# Patient Record
Sex: Female | Born: 1955 | Race: White | Hispanic: No | Smoking: Current every day smoker
Health system: Southern US, Community
[De-identification: ages and names within clinical notes are randomized; demographics above are authoritative.]

## PROBLEM LIST (undated history)

## (undated) DIAGNOSIS — K219 Gastro-esophageal reflux disease without esophagitis: Secondary | ICD-10-CM

## (undated) DIAGNOSIS — K5909 Other constipation: Secondary | ICD-10-CM

## (undated) DIAGNOSIS — I639 Cerebral infarction, unspecified: Secondary | ICD-10-CM

## (undated) DIAGNOSIS — F419 Anxiety disorder, unspecified: Secondary | ICD-10-CM

## (undated) DIAGNOSIS — E119 Type 2 diabetes mellitus without complications: Secondary | ICD-10-CM

## (undated) DIAGNOSIS — F32A Depression, unspecified: Secondary | ICD-10-CM

## (undated) DIAGNOSIS — I10 Essential (primary) hypertension: Secondary | ICD-10-CM

## (undated) DIAGNOSIS — J449 Chronic obstructive pulmonary disease, unspecified: Secondary | ICD-10-CM

## (undated) DIAGNOSIS — J45909 Unspecified asthma, uncomplicated: Secondary | ICD-10-CM

## (undated) DIAGNOSIS — F329 Major depressive disorder, single episode, unspecified: Secondary | ICD-10-CM

## (undated) HISTORY — PX: ABDOMINAL HYSTERECTOMY: SHX81

## (undated) HISTORY — PX: CHOLECYSTECTOMY: SHX55

## (undated) HISTORY — PX: APPENDECTOMY: SHX54

## (undated) HISTORY — PX: OTHER SURGICAL HISTORY: SHX169

---

## 2006-03-29 ENCOUNTER — Ambulatory Visit: Payer: Self-pay | Admitting: Internal Medicine

## 2006-09-26 ENCOUNTER — Other Ambulatory Visit: Payer: Self-pay

## 2006-09-26 ENCOUNTER — Emergency Department: Payer: Self-pay | Admitting: Internal Medicine

## 2006-09-27 ENCOUNTER — Ambulatory Visit: Payer: Self-pay | Admitting: Internal Medicine

## 2013-09-04 ENCOUNTER — Inpatient Hospital Stay: Payer: Self-pay | Admitting: Internal Medicine

## 2013-09-04 LAB — COMPREHENSIVE METABOLIC PANEL
Albumin: 3.8 g/dL (ref 3.4–5.0)
Alkaline Phosphatase: 143 U/L — ABNORMAL HIGH
Anion Gap: 10 (ref 7–16)
BUN: 8 mg/dL (ref 7–18)
Bilirubin,Total: 0.3 mg/dL (ref 0.2–1.0)
CREATININE: 0.9 mg/dL (ref 0.60–1.30)
Calcium, Total: 9 mg/dL (ref 8.5–10.1)
Chloride: 99 mmol/L (ref 98–107)
Co2: 30 mmol/L (ref 21–32)
EGFR (Non-African Amer.): >60
GLUCOSE: 131 mg/dL — AB (ref 65–99)
OSMOLALITY: 278 (ref 275–301)
POTASSIUM: 3.4 mmol/L — AB (ref 3.5–5.1)
SGOT(AST): 28 U/L (ref 15–37)
SGPT (ALT): 25 U/L
Sodium: 139 mmol/L (ref 136–145)
Total Protein: 7.9 g/dL (ref 6.4–8.2)

## 2013-09-04 LAB — TROPONIN I

## 2013-09-04 LAB — CBC
HCT: 44.5 % (ref 35.0–47.0)
HGB: 14.6 g/dL (ref 12.0–16.0)
MCH: 28.1 pg (ref 26.0–34.0)
MCHC: 32.8 g/dL (ref 32.0–36.0)
MCV: 86 fL (ref 80–100)
Platelet: 240 10*3/uL (ref 150–440)
RBC: 5.21 10*6/uL — AB (ref 3.80–5.20)
RDW: 14.6 % — ABNORMAL HIGH (ref 11.5–14.5)
WBC: 7.1 10*3/uL (ref 3.6–11.0)

## 2013-09-04 LAB — CK TOTAL AND CKMB (NOT AT ARMC)
CK, TOTAL: 158 U/L
CK-MB: 2.1 ng/mL (ref 0.5–3.6)

## 2013-09-04 LAB — MAGNESIUM: Magnesium: 1.6 mg/dL — ABNORMAL LOW

## 2013-09-05 LAB — MAGNESIUM: Magnesium: 1.9 mg/dL

## 2013-10-18 LAB — COMPREHENSIVE METABOLIC PANEL
ALBUMIN: 3.4 g/dL (ref 3.4–5.0)
ALK PHOS: 127 U/L — AB
ANION GAP: 3 — AB (ref 7–16)
AST: 14 U/L — AB (ref 15–37)
BUN: 8 mg/dL (ref 7–18)
Bilirubin,Total: 0.2 mg/dL (ref 0.2–1.0)
CALCIUM: 8.3 mg/dL — AB (ref 8.5–10.1)
Chloride: 102 mmol/L (ref 98–107)
Co2: 36 mmol/L — ABNORMAL HIGH (ref 21–32)
Creatinine: 1.06 mg/dL (ref 0.60–1.30)
EGFR (African American): >60
EGFR (Non-African Amer.): 57 — ABNORMAL LOW
Glucose: 112 mg/dL — ABNORMAL HIGH (ref 65–99)
Osmolality: 280 (ref 275–301)
POTASSIUM: 3.6 mmol/L (ref 3.5–5.1)
SGPT (ALT): 19 U/L
Sodium: 141 mmol/L (ref 136–145)
Total Protein: 6.6 g/dL (ref 6.4–8.2)

## 2013-10-18 LAB — CBC
HCT: 39.2 % (ref 35.0–47.0)
HGB: 12.6 g/dL (ref 12.0–16.0)
MCH: 28 pg (ref 26.0–34.0)
MCHC: 32 g/dL (ref 32.0–36.0)
MCV: 88 fL (ref 80–100)
Platelet: 229 10*3/uL (ref 150–440)
RBC: 4.48 10*6/uL (ref 3.80–5.20)
RDW: 15 % — ABNORMAL HIGH (ref 11.5–14.5)
WBC: 13.5 10*3/uL — ABNORMAL HIGH (ref 3.6–11.0)

## 2013-10-18 LAB — TROPONIN I: Troponin-I: <0.02 ng/mL

## 2013-10-19 ENCOUNTER — Inpatient Hospital Stay: Payer: Self-pay | Admitting: Internal Medicine

## 2013-10-22 LAB — PLATELET COUNT: Platelet: 240 10*3/uL (ref 150–440)

## 2013-10-23 LAB — EXPECTORATED SPUTUM ASSESSMENT W GRAM STAIN, RFLX TO RESP C

## 2013-10-24 LAB — EXPECTORATED SPUTUM ASSESSMENT W GRAM STAIN, RFLX TO RESP C

## 2013-11-02 DIAGNOSIS — G250 Essential tremor: Secondary | ICD-10-CM | POA: Insufficient documentation

## 2014-05-04 NOTE — Discharge Summary (Signed)
PATIENT NAME:  Sheri Barron, Sheri Barron MR#:  [id number] DATE OF BIRTH:  Jun 23, 1955  DATE OF ADMISSION:  09/04/2013 DATE OF DISCHARGE:  09/07/2013  PRIMARY CARE PHYSICIAN: [facility]   FINAL DIAGNOSES: 1.  Chronic obstructive pulmonary disease exacerbation.  2.  Tobacco abuse.  3.  Hypokalemia.  4.  Depression.   MEDICATIONS ON DISCHARGE: Include gabapentin 100 mg 3 times a day, trazodone 100 mg 3 tablets at bedtime, venlafaxine extended release 150 mg oral capsule once a day, propranolol 10 mg 2 tablets twice a day, Spiriva 1 inhalation daily, omeprazole 40 mg twice a day, ProAir HFA 1 puff 4 times a day as needed for shortness of breath, prednisone taper 10 mg 4 tablets day one, 3 tablets day two, 2 tablets day three, 1 tablet day four, 1/2 tablet day five and six, aspirin 81 mg daily, Symbicort 2 puffs twice a day, azithromycin 250 mg 1 tablet twice a day for 2 days, nicotine patch at 14 mg per chest wall film 1 tablet daily.   DIET: Low sodium diet, regular consistency.   ACTIVITY: As tolerated.   DISCHARGE FOLLOWUP: Follow up in 1 to 2 weeks at the [facility].   REASON FOR ADMISSION: The patient was admitted September 04, 2013 and discharged September 07, 2013. Came in with shortness of breath, cough and wheezing for 3 days. Was admitted for COPD exacerbation and started on IV Solu-Medrol.   DIAGNOSTIC DATA: During the hospital course included a magnesium of 1.6. Troponin 0.2. Glucose 131, BUN 8, creatinine 0.91, sodium 139, potassium 3.4, chloride 99, CO2 30, calcium 9. Liver function tests normal range. White blood cell count 7.1, H and H 14.6 and 44.5, platelet count 240,000.   EKG showed normal sinus rhythm, no acute ST-T wave changes.   Chest x-ray showed COPD, no acute cardiopulmonary disease.   Repeat magnesium 1.9.   HOSPITAL COURSE PER PROBLEM LIST:  1.  For the patient's COPD exacerbation, started on high-dose steroids. The patient at the time of discharge was moving better air  but still decreased breath sounds. Slight expiratory wheeze but felt much better than presentation. The patient was stable for discharge home on a prednisone taper and finish up the Zithromax. I did write her all her inhalers again. I actually wrote all of her scripts again.  2.  Tobacco abuse. Smoking cessation counseling done, 3 minutes by me. 3.  Hypokalemia and hypomagnesemia. Replaced during the hospital course.  4.  Depression. Continue psychiatric medications.  5.  History of stroke. I did start aspirin.  TIME SPENT ON DISCHARGE: 35 minutes.   ____________________________ Tana Conch. Leslye Peer, MD rjw:sb D: 09/07/2013 13:07:09 ET T: 09/07/2013 16:55:53 ET JOB#: [id number]  cc: Tana Conch. Leslye Peer, MD, <Dictator> [place] MD ELECTRONICALLY SIGNED 09/14/2013 15:08

## 2014-05-04 NOTE — H&P (Signed)
PATIENT NAME:  Sheri Barron, Sheri Barron MR#:  [id number] DATE OF BIRTH:  [birth year]  DATE OF ADMISSION:  10/18/2013  PRIMARY CARE PHYSICIAN:  [facility].   CHIEF COMPLAINT: Increasing shortness of breath and wheezing.   HISTORY OF PRESENT ILLNESS: Sheri Barron is a 59 year old Caucasian female with history of COPD and ongoing tobacco abuse along with history of depression. She comes to the Emergency Room with increasing shortness of breath and mild cough, wheezing. She denies any fever or chills. She had 2 rounds of antibiotics along with steroid taper recently in the past 1.5 months. Currently her oxygen saturations are anywhere from 89% to 93% on room air. She is being admitted for COPD exacerbation. Chest x-ray does not show any pneumonia and she is afebrile.   PAST MEDICAL HISTORY:  1.  COPD with ongoing tobacco abuse.  2.  History of CVA.  3.  Depression.   ALLERGIES: No known drug allergies.   MEDICATIONS:  1.  Aspirin 81 mg daily.  2.  Cyclobenzaprine 5 mg at bedtime.  3.  Fluticasone nasal spray 2 sprays nasally once a day.  4.  Gabapentin 100 mg 1 capsule 3 times a day.  5.  Omeprazole 40 mg 1 b.i.d.  6.  ProAir HFA 1 puff 4 times a day.  7.  Propranolol 10 mg 2 tablets b.i.d.  8.  Spiriva 18 mcg inhalation daily.  9.  Symbicort 160/4.5, 2 puffs b.i.d.  10. Trazodone 300 mg at bedtime.  11. Venlafaxine extended release 150 mg p.o. daily.   SOCIAL HISTORY: Smokes about 12 cigarettes a day. The patient was advised on smoking cessation. She reports she is working on it, she would prefer having a nicotine patch. Denies any alcohol or illicit drug use.   FAMILY HISTORY: Cancer in her family.   PAST SURGICAL HISTORY: Cholecystectomy, appendectomy and hysterectomy.   REVIEW OF SYSTEMS.  CONSTITUTIONAL: No fever, fatigue, weakness.  EYES: No blurred or double vision, glaucoma or cataracts.  ENT: No tinnitus, ear pain, hearing loss or postnasal drip.  RESPIRATORY: Positive for cough  shortness of breath and wheeze. Positive for COPD. CARDIOVASCULAR: No chest pain, edema, orthopnea, or dyspnea on exertion.  GASTROINTESTINAL: No nausea, vomiting, diarrhea, abdominal pain.  GENITOURINARY: No dysuria, hematuria, or frequency.  ENDOCRINE: No polyuria, nocturia or thyroid problems.  HEMATOLOGY: No anemia or easy bruising.  SKIN: No acne or rash.  MUSCULOSKELETAL: Positive for arthritis. No swelling or gout.  NEUROLOGIC: No CVA, TIA, dementia or dysarthria.  PSYCHIATRIC: No anxiety, insomnia, positive for history of depression. All other systems reviewed and negative.   PHYSICAL EXAMINATION:  GENERAL: The patient is awake, alert, oriented x3, not in acute distress.  VITAL SIGNS: Afebrile, pulse is 81, blood pressure is 89/63. Saturation is 93% on room air.  HEENT: Atraumatic, normocephalic. Pupils PERRLA. EOM intact. Oral mucosa is moist.  NECK: Supple. No JVD. No carotid bruit.  RESPIRATORY:  There are distant breath sounds, there is bilateral expiratory wheezing present. No respiratory distress or labored breathing.  CARDIOVASCULAR: Both heart sounds are normal. Rate, rhythm regular. PMI not lateralized. Chest is nontender.  EXTREMITIES: Good pedal pulses, good femoral pulses. No lower extremity edema.  ABDOMEN: Soft, benign, nontender. No organomegaly.  NEUROLOGIC: Grossly nonfocal, motor and sensory within normal limits. She follows commands.  PSYCHIATRIC: The patient is awake, alert, oriented x3.  SKIN: Warm and dry.   LABORATORY DATA: White count is 13.2, glucose is 112, CO2 is 36.   Chest x-ray consistent with COPD.  No pneumonia.   ASSESSMENT:  A 59 year old, Sheri Barron has a history of chronic obstructive pulmonary disease and ongoing tobacco abuse, comes in with:  1.  Acute chronic obstructive pulmonary disease exacerbation. The patient will be admitted on the medical floor. Regular diet. She is advised smoking cessation. We will give her IV Solu-Medrol around  the clock along with DuoNebs and her oral inhalers. At present, no indication for antibiotics. She recently finished 2 rounds of antibiotics. She does not have any fever and no evidence of pneumonia on the chest x-ray.  2.  Tobacco abuse. Nicotine patch has been ordered.  3.  Depression. Continue venlafaxine and trazodone.  4.  History of cerebrovascular accident, on aspirin.  5.  DVT prophylaxis. Subcutaneous heparin.    The above was discussed with the patient and patient's friend.   TIME SPENT: 50 minutes.    ____________________________ Sheri Rochester Posey Pronto, MD sap:lt D: 10/18/2013 13:35:59 ET T: 10/18/2013 14:12:20 ET JOB#: [id number]  cc: Sheri Barron A. Posey Pronto, MD, <Dictator> [facility] Ilda Basset MD ELECTRONICALLY SIGNED 10/29/2013 15:53

## 2014-05-04 NOTE — H&P (Signed)
PATIENT NAME:  Sheri Barron, Sheri Barron MR#:  [id number] DATE OF BIRTH:  [birth year]  DATE OF ADMISSION:  09/04/2013  PRIMARY CARE PHYSICIAN: Dr. Janalyn Rouse.    REFERRING PHYSICIAN: Dr. Thomasene Lot.   CHIEF COMPLAINT: Shortness of breath, cough, wheezing for the past 3 days.   HISTORY OF PRESENT ILLNESS: A 59 year old Caucasian female with a history of COPD, stroke depression presented to the ED with the above chief complaint. The patient is alert, awake, oriented, in no acute distress. The patient has history of COPD and got a cold 1 week ago and started to have shortness of breath, cough, wheezing, and sputum for the past 3 days. The patient had yellowish sputum, but denies any fever or chills. The patient had run out of nebulizer for several days, so she came to the ED for further treatment. The patient got 3 rounds of nebulizer without improvement. Dr. Thomasene Lot admitted for  COPD exacerbation.   The patient denies any chest pain, palpitation, orthopnea, nocturnal dyspnea. No leg edema. The patient denies any other symptoms.   PAST MEDICAL HISTORY: CVA, depression, COPD.   PAST SURGICAL HISTORY: Cholecystectomy, appendectomy, hysterectomy.   FAMILY HISTORY: Cancer in her family.   SOCIAL HISTORY: Smoked 2 packs a day several years ago, cut to 1 pack a day recently for 1 year. Denies any alcohol drinking or illicit drugs.   ALLERGIES: None.   HOME MEDICATIONS: Venlafaxine extended release 150 mg oral capsule 1 cap once a day, trazodone 100 mg 3 tablets at bedtime, Spiriva 18 mcg inhaled once a day, propranolol 10 mg p.o. 2 tablets b.i.d., ProAir HFA CFC free 90 mcg inhalation 2 puffs inhaled 4 times a day p.r.n., omeprazole 40 mg p.o. b.i.d., gabapentin 100 mg p.o. t.i.d., cyclobenzaprine 10 mg 0.5 tablets once a day.   REVIEW OF SYSTEMS: CONSTITUTIONAL: The patient denies any fever or chills. No headache or dizziness. No weakness.  EYES: No double vision or blurred vision.  ENT: No postnasal drip,  slurred speech, or dysphagia.  CARDIOVASCULAR: No chest pain, palpitation, orthopnea, or nocturnal dyspnea. No leg edema.  PULMONARY: Positive for cough, sputum, shortness of breath and wheezing. No hemoptysis.  GASTROINTESTINAL: No abdominal pain, nausea, vomiting, diarrhea. No melena or bloody stool.  GENITOURINARY: No dysuria, hematuria, or incontinence.  SKIN: No rash or jaundice.  NEUROLOGY: No syncope, loss of consciousness, or seizure.  ENDOCRINE: No polyuria, polydipsia, heat or cold intolerance.  HEMATOLOGIC: No easy bruising  or bleeding.   PHYSICAL EXAMINATION: VITAL SIGNS: Temperature 98.2, blood pressure 139/85, pulse 100 and just now it was 113, 02 saturation 93%.  GENERAL: The patient is alert, awake, oriented, in no acute distress.  HEENT: Pupils round, equal, and reactive to light and accommodation. Moist oral mucosa. Clear oropharynx.  NECK: Supple. No JVD or carotid bruits are noted. No lymphadenopathy. No thyromegaly.  CARDIOVASCULAR: S1, S2. Regular rate and rhythm. No murmurs or gallops.  PULMONARY: Bilateral air entry. Weak breath sounds, mild wheezing, mild use of accessory muscles to breathe.  ABDOMEN: Soft. No distention or tenderness. No organomegaly. Bowel sounds present.  EXTREMITIES: No edema, clubbing, or cyanosis. No calf tenderness. Bilateral pedal pulses present.  SKIN: No rash or jaundice.  NEUROLOGIC: A and O x 3. No focal deficit. Power 5/5. Sensation intact.   LABORATORY DATA: Chest x-ray shows COPD but no acute cardiopulmonary disease. CK 158, CK-MB 2.1. CBC in normal range. Glucose 131, BUN 8, creatinine 0.9, sodium 139, potassium 3.4, chloride 99, bicarbonate 30. Troponin less than 0.02. EKG  showed normal sinus rhythm at 92 BPM.   IMPRESSIONS: 1.  Chronic obstructive pulmonary exacerbation.  2.  Hypokalemia.   3.  Tobacco abuse.  4.  Depression.  5.  History of cerebrovascular accident.   PLAN OF TREATMENT: 1.  The patient will be admitted to  the medical floor. We will continue nebulizer treatment with DuoNeb around the clock. Continue oxygen by nasal cannula. We will start Solu-Medrol and continue Spiriva.  2.  Hypokalemia. We will give potassium supplement, check a magnesium level, follow up BMP.   3.  For tobacco abuse, smoking cessation was counseled for 4 minutes. We will give nicotine patch.  4.  Continue depression medications.   I discussed the patient's condition and plan of treatment with the patient and the patient's daughter.   CODE STATUS: The patient wants full code.   TIME SPENT: About 52 minutes.    ____________________________ Demetrios Loll, MD qc:at D: 09/04/2013 14:39:31 ET T: 09/04/2013 15:33:59 ET JOB#: [id number]  cc: Demetrios Loll, MD, <Dictator> Demetrios Loll MD ELECTRONICALLY SIGNED 09/05/2013 16:37

## 2014-05-04 NOTE — Discharge Summary (Signed)
PATIENT NAME:  Sheri Barron, Sheri Barron MR#:  [id number] DATE OF BIRTH:  09-Nov-1955  DATE OF ADMISSION:  10/18/2013 DATE OF DISCHARGE:  10/22/2013  ADMITTING PHYSICIAN: Fritzi Mandes, MD  DISCHARGING PHYSICIAN: Gladstone Lighter, MD  PRIMARY CARE PHYSICIAN: [facility]  DISCHARGE DIAGNOSES: 1.  Acute on chronic obstructive pulmonary disease exacerbation.  2.  Acute bronchitis.  3.  Acute sinusitis.  4.  Tobacco use disorder.  5.  Neuropathy.  6.  Depression.   DISCHARGE HOME MEDICATIONS:  1.  Pro Air inhaler 1 puff 4 times a day as needed for shortness of breath.  2.  Omeprazole 40 mg p.o. b.i.d.  3.  Aspirin 81 mg p.o. daily.  4.  Flexeril 5 mg p.o. at bedtime.  5.  Flonase nasal spray 2 sprays in each nostril once a day.  6.  Gabapentin 100 mg p.o. 3 times a day.  7.  Trazodone 300 mg p.o. at bedtime.  8.  Venlafaxine 150 mg p.o. daily.  9.  Propranolol 20 mg p.o. b.i.d.  10.  Symbicort 160/4.5 mcg 2 puffs b.i.d.  11.  Spiriva inhaler 1 inhalation daily.  12.  Prednisone taper over 6 days.  13.  Mucinex 600 mg p.o. b.i.d. for 5 more days.  14.  Levaquin 750 mg p.o. daily for 5 more days.  DISCHARGE HOME OXYGEN: 2 liters.   DISCHARGE DIET: Regular diet.   DISCHARGE ACTIVITY: As tolerated.  FOLLOWUP INSTRUCTIONS: PCP followup in 1 to 2 weeks and advised to stop smoking.   DIAGNOSTIC DATA: Prior to discharge: WBC 13.5, hemoglobin 12.6, hematocrit 39.2, platelet count 229,000.   Sodium 141, potassium 3.6, chloride 102, bicarb 36, BUN 8, creatinine 1.06, glucose 112, calcium 8.3.   ALT 19, AST 14, alk phos 127, total bilirubin 0.2, albumin 3.4. Troponins negative.   Chest x-ray on admission showing COPD changes. No acute abnormalities.   Sputum cultures are negative so far.   BRIEF HOSPITAL COURSE: [name] is a 59 year old female with past medical history significant for COPD, ongoing smoking, depression and neuropathy who presents to the hospital secondary to worsening  shortness of breath and wheezing and also productive cough. 1.  Acute on chronic obstructive pulmonary disease exacerbation with ongoing smoking. Started on IV steroids, nebulizers, and also her home inhalers were continued. She was hypoxic requiring 2 liters oxygen. Even with her COPD improvement, her oxygen requirements have not improved. Chest x-ray showed only COPD changes. Her sats dropped down to 84% on exertion, so she qualified for home oxygen and is being discharged on 2 liters home oxygen. She was strongly advised to quit smoking and the dangers of using oxygen and smoking at the same time have been explained to the patient. The patient acknowledged it and expressed interest to quit smoking at this point. Steroids are being changed over to oral steroids. She is on Levaquin because of her thick, greenish productive sputum. She probably has underlying bronchitis and also sinusitis clinically. She will be discharged on antibiotics as well. 2.  Depression. Her home medications were continued.  3.  Neuropathy. She is on gabapentin.  Her course has been otherwise uneventful in the hospital.   DISCHARGE CONDITION: Stable.   DISCHARGE DISPOSITION: Home.   TIME SPENT ON DISCHARGE: 40 minutes.  ____________________________ Gladstone Lighter, MD rk:sb D: 10/22/2013 10:43:03 ET T: 10/22/2013 11:38:30 ET JOB#: [id number]  cc: Gladstone Lighter, MD, <Dictator> [facility] Gladstone Lighter MD ELECTRONICALLY SIGNED 10/24/2013 18:06

## 2014-05-22 ENCOUNTER — Other Ambulatory Visit: Payer: Self-pay | Admitting: Otolaryngology

## 2014-05-22 DIAGNOSIS — R1310 Dysphagia, unspecified: Secondary | ICD-10-CM

## 2014-05-30 ENCOUNTER — Ambulatory Visit: Payer: Self-pay | Attending: Otolaryngology

## 2014-07-29 ENCOUNTER — Other Ambulatory Visit: Payer: Self-pay | Admitting: Nurse Practitioner

## 2014-07-29 DIAGNOSIS — K5900 Constipation, unspecified: Secondary | ICD-10-CM

## 2014-07-29 DIAGNOSIS — R1310 Dysphagia, unspecified: Secondary | ICD-10-CM

## 2014-07-29 DIAGNOSIS — R1032 Left lower quadrant pain: Secondary | ICD-10-CM

## 2014-08-02 ENCOUNTER — Ambulatory Visit
Admission: RE | Admit: 2014-08-02 | Discharge: 2014-08-02 | Disposition: A | Discharge status: Home / Self Care | Payer: Medicare Other | Source: Ambulatory Visit | Attending: Nurse Practitioner | Admitting: Nurse Practitioner

## 2014-08-02 DIAGNOSIS — K760 Fatty (change of) liver, not elsewhere classified: Secondary | ICD-10-CM | POA: Insufficient documentation

## 2014-08-02 DIAGNOSIS — K5900 Constipation, unspecified: Secondary | ICD-10-CM | POA: Diagnosis present

## 2014-08-02 DIAGNOSIS — R1032 Left lower quadrant pain: Secondary | ICD-10-CM | POA: Diagnosis present

## 2014-08-02 HISTORY — DX: Chronic obstructive pulmonary disease, unspecified: J44.9

## 2014-08-02 MED ORDER — IOHEXOL 300 MG/ML  SOLN
100.0000 mL | Freq: Once | INTRAMUSCULAR | Status: AC | PRN
  Administered 2014-08-02: 100 mL via INTRAVENOUS

## 2014-08-09 ENCOUNTER — Ambulatory Visit
Admission: RE | Admit: 2014-08-09 | Discharge: 2014-08-09 | Disposition: A | Discharge status: Home / Self Care | Payer: Medicare Other | Source: Ambulatory Visit | Attending: Nurse Practitioner | Admitting: Nurse Practitioner

## 2014-08-09 DIAGNOSIS — R1310 Dysphagia, unspecified: Secondary | ICD-10-CM | POA: Diagnosis not present

## 2014-09-03 ENCOUNTER — Encounter: Payer: Self-pay | Admitting: *Deleted

## 2014-09-04 ENCOUNTER — Ambulatory Visit: Payer: Medicare Other | Admitting: Anesthesiology

## 2014-09-04 ENCOUNTER — Encounter
Admission: RE | Disposition: A | Discharge status: Home / Self Care | Payer: Self-pay | Source: Ambulatory Visit | Attending: Unknown Physician Specialty

## 2014-09-04 ENCOUNTER — Encounter: Payer: Self-pay | Admitting: *Deleted

## 2014-09-04 ENCOUNTER — Ambulatory Visit
Admission: RE | Admit: 2014-09-04 | Discharge: 2014-09-04 | Disposition: A | Discharge status: Home / Self Care | Payer: Medicare Other | Source: Ambulatory Visit | Attending: Unknown Physician Specialty | Admitting: Unknown Physician Specialty

## 2014-09-04 DIAGNOSIS — Z90710 Acquired absence of both cervix and uterus: Secondary | ICD-10-CM | POA: Diagnosis not present

## 2014-09-04 DIAGNOSIS — R1310 Dysphagia, unspecified: Secondary | ICD-10-CM | POA: Insufficient documentation

## 2014-09-04 DIAGNOSIS — Z9049 Acquired absence of other specified parts of digestive tract: Secondary | ICD-10-CM | POA: Insufficient documentation

## 2014-09-04 DIAGNOSIS — Z7951 Long term (current) use of inhaled steroids: Secondary | ICD-10-CM | POA: Insufficient documentation

## 2014-09-04 DIAGNOSIS — J449 Chronic obstructive pulmonary disease, unspecified: Secondary | ICD-10-CM | POA: Diagnosis not present

## 2014-09-04 DIAGNOSIS — K640 First degree hemorrhoids: Secondary | ICD-10-CM | POA: Diagnosis not present

## 2014-09-04 DIAGNOSIS — K5900 Constipation, unspecified: Secondary | ICD-10-CM | POA: Diagnosis not present

## 2014-09-04 DIAGNOSIS — F329 Major depressive disorder, single episode, unspecified: Secondary | ICD-10-CM | POA: Insufficient documentation

## 2014-09-04 DIAGNOSIS — K2970 Gastritis, unspecified, without bleeding: Secondary | ICD-10-CM | POA: Diagnosis not present

## 2014-09-04 DIAGNOSIS — F419 Anxiety disorder, unspecified: Secondary | ICD-10-CM | POA: Diagnosis not present

## 2014-09-04 DIAGNOSIS — K219 Gastro-esophageal reflux disease without esophagitis: Secondary | ICD-10-CM | POA: Insufficient documentation

## 2014-09-04 DIAGNOSIS — Z79899 Other long term (current) drug therapy: Secondary | ICD-10-CM | POA: Diagnosis not present

## 2014-09-04 DIAGNOSIS — K222 Esophageal obstruction: Secondary | ICD-10-CM | POA: Insufficient documentation

## 2014-09-04 DIAGNOSIS — F17200 Nicotine dependence, unspecified, uncomplicated: Secondary | ICD-10-CM | POA: Insufficient documentation

## 2014-09-04 DIAGNOSIS — Z882 Allergy status to sulfonamides status: Secondary | ICD-10-CM | POA: Diagnosis not present

## 2014-09-04 DIAGNOSIS — Z8673 Personal history of transient ischemic attack (TIA), and cerebral infarction without residual deficits: Secondary | ICD-10-CM | POA: Insufficient documentation

## 2014-09-04 HISTORY — DX: Major depressive disorder, single episode, unspecified: F32.9

## 2014-09-04 HISTORY — DX: Depression, unspecified: F32.A

## 2014-09-04 HISTORY — DX: Anxiety disorder, unspecified: F41.9

## 2014-09-04 HISTORY — DX: Gastro-esophageal reflux disease without esophagitis: K21.9

## 2014-09-04 HISTORY — PX: SAVORY DILATION: SHX5439

## 2014-09-04 HISTORY — PX: COLONOSCOPY WITH PROPOFOL: SHX5780

## 2014-09-04 HISTORY — DX: Other constipation: K59.09

## 2014-09-04 HISTORY — PX: ESOPHAGOGASTRODUODENOSCOPY (EGD) WITH PROPOFOL: SHX5813

## 2014-09-04 HISTORY — DX: Cerebral infarction, unspecified: I63.9

## 2014-09-04 SURGERY — COLONOSCOPY WITH PROPOFOL
Anesthesia: General

## 2014-09-04 MED ORDER — SODIUM CHLORIDE 0.9 % IV SOLN
INTRAVENOUS | Status: DC
  Administered 2014-09-04 (×2): via INTRAVENOUS

## 2014-09-04 MED ORDER — SODIUM CHLORIDE 0.9 % IV SOLN
INTRAVENOUS | Status: DC
  Administered 2014-09-04: 14:00:00 via INTRAVENOUS

## 2014-09-04 NOTE — H&P (Signed)
Primary Care Physician:  No PCP Per Patient Primary Gastroenterologist:  Dr. Vira Agar  Pre-Procedure History & Physical: HPI:  Sheri Barron is a 59 y.o. female is here for an endoscopy and colonoscopy.   Past Medical History  Diagnosis Date  . COPD (chronic obstructive pulmonary disease)   . Stroke   . Depression   . Anxiety   . Chronic constipation   . GERD (gastroesophageal reflux disease)     Past Surgical History  Procedure Laterality Date  . Cholecystectomy    . Appendectomy    . Abdominal hysterectomy    . Egd-colonsocopy      Prior to Admission medications   Medication Sig Start Date End Date Taking? Authorizing Provider  albuterol (PROVENTIL HFA;VENTOLIN HFA) 108 (90 BASE) MCG/ACT inhaler Inhale into the lungs every 6 (six) hours as needed for wheezing or shortness of breath.   Yes Historical Provider, MD  aspirin 81 MG chewable tablet Chew by mouth daily.   Yes Historical Provider, MD  azelastine (ASTELIN) 0.1 % nasal spray Place into both nostrils 2 (two) times daily. Use in each nostril as directed   Yes Historical Provider, MD  beclomethasone (QVAR) 80 MCG/ACT inhaler Inhale into the lungs 2 (two) times daily.   Yes Historical Provider, MD  budesonide-formoterol (SYMBICORT) 160-4.5 MCG/ACT inhaler Inhale 2 puffs into the lungs 2 (two) times daily.   Yes Historical Provider, MD  busPIRone (BUSPAR) 7.5 MG tablet Take 7.5 mg by mouth 3 (three) times daily.   Yes Historical Provider, MD  fluticasone (FLONASE) 50 MCG/ACT nasal spray Place into both nostrils daily.   Yes Historical Provider, MD  Ipratropium-Albuterol (COMBIVENT RESPIMAT) 20-100 MCG/ACT AERS respimat Inhale 1 puff into the lungs every 6 (six) hours.   Yes Historical Provider, MD  lactulose (CHRONULAC) 10 GM/15ML solution Take 10 g by mouth 3 (three) times daily.   Yes Historical Provider, MD  lidocaine (LIDODERM) 5 % Place 1 patch onto the skin daily. Remove & Discard patch within 12 hours or as  directed by MD   Yes Historical Provider, MD  mometasone (ELOCON) 0.1 % lotion Apply topically daily.   Yes Historical Provider, MD  omeprazole (PRILOSEC) 40 MG capsule Take 40 mg by mouth 2 (two) times daily.   Yes Historical Provider, MD  phenylephrine (,USE FOR PREPARATION-H,) 0.25 % suppository Place 1 suppository rectally 2 (two) times daily.   Yes Historical Provider, MD  propranolol (INDERAL) 20 MG tablet Take 20 mg by mouth 3 (three) times daily.   Yes Historical Provider, MD  sucralfate (CARAFATE) 1 G tablet Take 1 g by mouth 4 (four) times daily.   Yes Historical Provider, MD  traZODone (DESYREL) 100 MG tablet Take 100 mg by mouth at bedtime.   Yes Historical Provider, MD  venlafaxine XR (EFFEXOR-XR) 150 MG 24 hr capsule Take 150 mg by mouth daily with breakfast.   Yes Historical Provider, MD    Allergies as of 08/12/2014 - never reviewed  Allergen Reaction Noted  . Sulfa antibiotics Nausea Only 08/02/2014    History reviewed. No pertinent family history.  Social History   Social History  . Marital Status: Married    Spouse Name: N/A  . Number of Children: N/A  . Years of Education: N/A   Occupational History  . Not on file.   Social History Main Topics  . Smoking status: Current Every Day Smoker -- 1.00 packs/day  . Smokeless tobacco: Not on file  . Alcohol Use: No  . Drug  Use: No  . Sexual Activity: Not on file   Other Topics Concern  . Not on file   Social History Narrative    Review of Systems: See HPI, otherwise negative ROS  Physical Exam: BP 152/137 mmHg  Pulse 89  Temp(Src) 98.4 F (36.9 C) (Tympanic)  Resp 18  Ht 5' (1.524 m)  Wt 77.111 kg (170 lb)  BMI 33.20 kg/m2  SpO2 97%  LMP  General:   Alert,  pleasant and cooperative in NAD Head:  Normocephalic and atraumatic. Neck:  Supple; no masses or thyromegaly. Lungs:  Clear throughout to auscultation.    Heart:  Regular rate and rhythm. Abdomen:  Soft, nontender and nondistended. Normal  bowel sounds, without guarding, and without rebound.   Neurologic:  Alert and  oriented x4;  grossly normal neurologically.  Impression/Plan: Sheri Barron is here for an endoscopy and colonoscopy to be performed for Left upper abd pain, constipation and dysphagia  Risks, benefits, limitations, and alternatives regarding  endoscopy and colonoscopy have been reviewed with the patient.  Questions have been answered.  All parties agreeable.   Gaylyn Cheers, MD  09/04/2014, 3:11 PM

## 2014-09-04 NOTE — Anesthesia Preprocedure Evaluation (Signed)
Anesthesia Evaluation  Patient identified by MRN, date of birth, ID band Patient awake    Reviewed: Allergy & Precautions, NPO status , Patient's Chart, lab work & pertinent test results  Airway Mallampati: III  TM Distance: <3 FB Neck ROM: Full    Dental  (+) Upper Dentures, Partial Lower   Pulmonary COPD COPD inhaler, Current Smoker,  breath sounds clear to auscultation  Pulmonary exam normal       Cardiovascular negative cardio ROS Normal cardiovascular exam    Neuro/Psych PSYCHIATRIC DISORDERS Anxiety Depression CVA    GI/Hepatic Neg liver ROS, GERD-  Medicated and Controlled,  Endo/Other  negative endocrine ROS  Renal/GU negative Renal ROS  negative genitourinary   Musculoskeletal negative musculoskeletal ROS (+)   Abdominal Normal abdominal exam  (+)   Peds negative pediatric ROS (+)  Hematology negative hematology ROS (+)   Anesthesia Other Findings   Reproductive/Obstetrics                             Anesthesia Physical Anesthesia Plan  ASA: III  Anesthesia Plan: General   Post-op Pain Management:    Induction: Intravenous  Airway Management Planned: Nasal Cannula  Additional Equipment:   Intra-op Plan:   Post-operative Plan:   Informed Consent: I have reviewed the patients History and Physical, chart, labs and discussed the procedure including the risks, benefits and alternatives for the proposed anesthesia with the patient or authorized representative who has indicated his/her understanding and acceptance.   Dental advisory given  Plan Discussed with: CRNA and Surgeon  Anesthesia Plan Comments:         Anesthesia Quick Evaluation

## 2014-09-04 NOTE — Op Note (Signed)
[facility] Gastroenterology Patient Name: Sheri Barron Procedure Date: 09/04/2014 3:09 PM MRN: [id number] Account #: [id number] Date of Birth: 12/25/1955 Admit Type: Outpatient Age: 59 Room: [facility] ENDO ROOM 3 Gender: Female Note Status: Finalized Procedure:         Upper GI endoscopy Indications:       Dysphagia Providers:         Manya Silvas, MD Medicines:         Propofol per Anesthesia Complications:     No immediate complications. Procedure:         Pre-Anesthesia Assessment:                    - After reviewing the risks and benefits, the patient was                     deemed in satisfactory condition to undergo the procedure.                    After obtaining informed consent, the endoscope was passed                     under direct vision. Throughout the procedure, the                     patient's blood pressure, pulse, and oxygen saturations                     were monitored continuously. The Endoscope was introduced                     through the mouth, and advanced to the second part of                     duodenum. The upper GI endoscopy was accomplished without                     difficulty. The patient tolerated the procedure well. Findings:      A mild Schatzki ring (acquired) was found at the gastroesophageal       junction. A guidewire was placed and the scope was withdrawn. Dilation       was performed with a Savary dilator with mild resistance at 16 mm and 17       mm.      Localized mild inflammation characterized by erythema and granularity       was found at the gastroesophageal junction. Biopsies were taken with a       cold forceps for histology. Biopsies were taken with a cold forceps for       Helicobacter pylori testing.      A small hiatus hernia was present.      The examined duodenum was normal. Impression:        - Mild Schatzki ring. Dilated.                    - Gastritis. Biopsied.                    - Normal  stomach.                    - Normal examined duodenum. Recommendation:    - Await pathology results. Manya Silvas, MD 09/04/2014 3:51:14 PM This report has been signed electronically. Number of Addenda: 0 Note Initiated On: 09/04/2014  3:09 PM      [facility]

## 2014-09-04 NOTE — Op Note (Signed)
[facility] Patient Name: Sheri Barron Procedure Date: 09/04/2014 3:09 PM MRN: [id number] Account #: [id number] Date of Birth: 08/12/1955 Admit Type: Outpatient Age: 59 Room: [facility] ENDO ROOM 3 Gender: Female Note Status: Finalized Procedure:         Colonoscopy Indications:       Abdominal pain in the left pper quadrant Providers:         Manya Silvas, MD Referring MD:      Daniel Nones, MD (Referring MD) Medicines:         Propofol per Anesthesia Complications:     No immediate complications. Procedure:         Pre-Anesthesia Assessment:                    - After reviewing the risks and benefits, the patient was                     deemed in satisfactory condition to undergo the procedure.                    After obtaining informed consent, the colonoscope was                     passed under direct vision. Throughout the procedure, the                     patient's blood pressure, pulse, and oxygen saturations                     were monitored continuously. The Colonoscope was                     introduced through the anus and advanced to the the cecum,                     identified by appendiceal orifice and ileocecal valve. The                     colonoscopy was performed without difficulty. The patient                     tolerated the procedure well. The quality of the bowel                     preparation was good. Findings:      Internal hemorrhoids were found during endoscopy. The hemorrhoids were       small and Grade I (internal hemorrhoids that do not prolapse).      The exam was otherwise without abnormality. Impression:        - Internal hemorrhoids.                    - The examination was otherwise normal.                    - No specimens collected. Recommendation:    - Repeat colonoscopy in 10 years for screening purposes. Manya Silvas, MD 09/04/2014 3:34:22 PM This report has been signed  electronically. Number of Addenda: 0 Note Initiated On: 09/04/2014 3:09 PM Scope Withdrawal Time: 0 hours 4 minutes 41 seconds  Total Procedure Duration: 0 hours 11 minutes 36 seconds       [facility]

## 2014-09-05 DIAGNOSIS — K222 Esophageal obstruction: Secondary | ICD-10-CM | POA: Diagnosis not present

## 2014-09-05 MED ORDER — MIDAZOLAM HCL 2 MG/2ML IJ SOLN
INTRAMUSCULAR | Status: DC | PRN
  Administered 2014-09-04: 2 mg via INTRAVENOUS

## 2014-09-05 MED ORDER — PROPOFOL 10 MG/ML IV BOLUS
INTRAVENOUS | Status: DC | PRN
  Administered 2014-09-04: 40 mg via INTRAVENOUS

## 2014-09-05 MED ORDER — LIDOCAINE HCL (CARDIAC) 20 MG/ML IV SOLN
INTRAVENOUS | Status: DC | PRN
  Administered 2014-09-04: 100 mg via INTRAVENOUS

## 2014-09-05 MED ORDER — PROPOFOL INFUSION 10 MG/ML OPTIME
INTRAVENOUS | Status: DC | PRN
  Administered 2014-09-04: 150 ug/kg/min via INTRAVENOUS

## 2014-09-05 NOTE — Transfer of Care (Signed)
Immediate Anesthesia Transfer of Care Note  Patient: Sheri Barron  Procedure(s) Performed: Procedure(s): COLONOSCOPY WITH PROPOFOL (N/A) ESOPHAGOGASTRODUODENOSCOPY (EGD) WITH PROPOFOL (N/A) SAVORY DILATION (N/A)  Patient Location: Endoscopy Unit  Anesthesia Type:General  Level of Consciousness: awake, alert , oriented and patient cooperative  Airway & Oxygen Therapy: Patient Spontanous Breathing and Patient connected to nasal cannula oxygen  Post-op Assessment: Report given to RN, Post -op Vital signs reviewed and stable and Patient moving all extremities X 4  Post vital signs: Reviewed and stable  Last Vitals:  Filed Vitals:   09/04/14 1600  BP: 131/101  Pulse: 99  Temp: 36.9 C  Resp: 16    Complications: No apparent anesthesia complications

## 2014-09-06 LAB — SURGICAL PATHOLOGY

## 2014-09-06 NOTE — Anesthesia Postprocedure Evaluation (Signed)
  Anesthesia Post-op Note  Patient: Sheri Barron  Procedure(s) Performed: Procedure(s): COLONOSCOPY WITH PROPOFOL (N/A) ESOPHAGOGASTRODUODENOSCOPY (EGD) WITH PROPOFOL (N/A) SAVORY DILATION (N/A)  Anesthesia type:General  Patient location: PACU  Post pain: Pain level controlled  Post assessment: Post-op Vital signs reviewed, Patient's Cardiovascular Status Stable, Respiratory Function Stable, Patent Airway and No signs of Nausea or vomiting  Post vital signs: Reviewed and stable  Last Vitals:  Filed Vitals:   09/04/14 1600  BP: 131/101  Pulse: 99  Temp: 36.9 C  Resp: 16    Level of consciousness: awake, alert  and patient cooperative  Complications: No apparent anesthesia complications

## 2014-09-18 ENCOUNTER — Encounter: Payer: Self-pay | Admitting: Unknown Physician Specialty

## 2014-09-30 ENCOUNTER — Other Ambulatory Visit: Payer: Self-pay | Admitting: Family Medicine

## 2014-09-30 DIAGNOSIS — Z1231 Encounter for screening mammogram for malignant neoplasm of breast: Secondary | ICD-10-CM

## 2014-10-15 ENCOUNTER — Ambulatory Visit
Admission: RE | Admit: 2014-10-15 | Discharge: 2014-10-15 | Disposition: A | Discharge status: Home / Self Care | Payer: Medicare Other | Source: Ambulatory Visit | Attending: Family Medicine | Admitting: Family Medicine

## 2014-10-15 DIAGNOSIS — Z1231 Encounter for screening mammogram for malignant neoplasm of breast: Secondary | ICD-10-CM | POA: Diagnosis present

## 2014-12-06 ENCOUNTER — Encounter: Payer: Self-pay | Admitting: Emergency Medicine

## 2014-12-06 ENCOUNTER — Emergency Department
Admission: EM | Admit: 2014-12-06 | Discharge: 2014-12-06 | Disposition: A | Discharge status: Home / Self Care | Payer: Medicare Other | Attending: Emergency Medicine | Admitting: Emergency Medicine

## 2014-12-06 ENCOUNTER — Emergency Department: Payer: Medicare Other

## 2014-12-06 DIAGNOSIS — J441 Chronic obstructive pulmonary disease with (acute) exacerbation: Secondary | ICD-10-CM | POA: Insufficient documentation

## 2014-12-06 DIAGNOSIS — J40 Bronchitis, not specified as acute or chronic: Secondary | ICD-10-CM

## 2014-12-06 DIAGNOSIS — Z7951 Long term (current) use of inhaled steroids: Secondary | ICD-10-CM | POA: Diagnosis not present

## 2014-12-06 DIAGNOSIS — Z7982 Long term (current) use of aspirin: Secondary | ICD-10-CM | POA: Diagnosis not present

## 2014-12-06 DIAGNOSIS — F17200 Nicotine dependence, unspecified, uncomplicated: Secondary | ICD-10-CM | POA: Insufficient documentation

## 2014-12-06 DIAGNOSIS — R0602 Shortness of breath: Secondary | ICD-10-CM | POA: Diagnosis present

## 2014-12-06 DIAGNOSIS — Z79899 Other long term (current) drug therapy: Secondary | ICD-10-CM | POA: Diagnosis not present

## 2014-12-06 LAB — BASIC METABOLIC PANEL
ANION GAP: 10 (ref 5–15)
BUN: 9 mg/dL (ref 6–20)
CALCIUM: 9.7 mg/dL (ref 8.9–10.3)
CHLORIDE: 104 mmol/L (ref 101–111)
CO2: 26 mmol/L (ref 22–32)
Creatinine, Ser: 0.88 mg/dL (ref 0.44–1.00)
GFR calc Af Amer: >60 mL/min (ref >60)
GLUCOSE: 122 mg/dL — AB (ref 65–99)
POTASSIUM: 3.8 mmol/L (ref 3.5–5.1)
Sodium: 140 mmol/L (ref 135–145)

## 2014-12-06 LAB — CBC
HCT: 41.6 % (ref 35.0–47.0)
Hemoglobin: 13.6 g/dL (ref 12.0–16.0)
MCH: 27.8 pg (ref 26.0–34.0)
MCHC: 32.7 g/dL (ref 32.0–36.0)
MCV: 85.1 fL (ref 80.0–100.0)
PLATELETS: 295 10*3/uL (ref 150–440)
RBC: 4.88 MIL/uL (ref 3.80–5.20)
RDW: 15.1 % — AB (ref 11.5–14.5)
WBC: 15.7 10*3/uL — ABNORMAL HIGH (ref 3.6–11.0)

## 2014-12-06 LAB — TROPONIN I: Troponin I: <0.03 ng/mL (ref <0.031)

## 2014-12-06 LAB — BRAIN NATRIURETIC PEPTIDE: B NATRIURETIC PEPTIDE 5: 15 pg/mL (ref 0.0–100.0)

## 2014-12-06 MED ORDER — IPRATROPIUM-ALBUTEROL 0.5-2.5 (3) MG/3ML IN SOLN
3.0000 mL | Freq: Once | RESPIRATORY_TRACT | Status: AC
  Administered 2014-12-06: 3 mL via RESPIRATORY_TRACT
  Filled 2014-12-06: qty 3

## 2014-12-06 MED ORDER — METHYLPREDNISOLONE SODIUM SUCC 125 MG IJ SOLR
125.0000 mg | Freq: Once | INTRAMUSCULAR | Status: AC
  Administered 2014-12-06: 125 mg via INTRAVENOUS
  Filled 2014-12-06: qty 2

## 2014-12-06 MED ORDER — PREDNISONE 20 MG PO TABS: 40.0000 mg | ORAL_TABLET | Freq: Every day | ORAL | Status: DC

## 2014-12-06 MED ORDER — AZITHROMYCIN 250 MG PO TABS: ORAL_TABLET | ORAL | Status: AC

## 2014-12-06 MED ORDER — AZITHROMYCIN 250 MG PO TABS
500.0000 mg | ORAL_TABLET | Freq: Once | ORAL | Status: AC
  Administered 2014-12-06: 500 mg via ORAL
  Filled 2014-12-06: qty 2

## 2014-12-06 MED ORDER — ALBUTEROL SULFATE HFA 108 (90 BASE) MCG/ACT IN AERS: 2.0000 | INHALATION_SPRAY | Freq: Four times a day (QID) | RESPIRATORY_TRACT | Status: DC | PRN

## 2014-12-06 NOTE — Discharge Instructions (Signed)
You have been seen in the emergency department today for trouble breathing and cough. Your workup has shown normal results. As we discussed please follow-up with your primary care physician in the next 1-2 days for recheck. Return to the emergency department for any chest pain, worsening trouble breathing, or any other symptom personally concerning to yourself.   Upper Respiratory Infection, Adult Most upper respiratory infections (URIs) are a viral infection of the air passages leading to the lungs. A URI affects the nose, throat, and upper air passages. The most common type of URI is nasopharyngitis and is typically referred to as "the common cold." URIs run their course and usually go away on their own. Most of the time, a URI does not require medical attention, but sometimes a bacterial infection in the upper airways can follow a viral infection. This is called a secondary infection. Sinus and middle ear infections are common types of secondary upper respiratory infections. Bacterial pneumonia can also complicate a URI. A URI can worsen asthma and chronic obstructive pulmonary disease (COPD). Sometimes, these complications can require emergency medical care and may be life threatening.  CAUSES Almost all URIs are caused by viruses. A virus is a type of germ and can spread from one person to another.  RISKS FACTORS You may be at risk for a URI if:   You smoke.   You have chronic heart or lung disease.  You have a weakened defense (immune) system.   You are very young or very old.   You have nasal allergies or asthma.  You work in crowded or poorly ventilated areas.  You work in health care facilities or schools. SIGNS AND SYMPTOMS  Symptoms typically develop 2-3 days after you come in contact with a cold virus. Most viral URIs last 7-10 days. However, viral URIs from the influenza virus (flu virus) can last 14-18 days and are typically more severe. Symptoms may include:   Runny or  stuffy (congested) nose.   Sneezing.   Cough.   Sore throat.   Headache.   Fatigue.   Fever.   Loss of appetite.   Pain in your forehead, behind your eyes, and over your cheekbones (sinus pain).  Muscle aches.  DIAGNOSIS  Your health care provider may diagnose a URI by:  Physical exam.  Tests to check that your symptoms are not due to another condition such as:  Strep throat.  Sinusitis.  Pneumonia.  Asthma. TREATMENT  A URI goes away on its own with time. It cannot be cured with medicines, but medicines may be prescribed or recommended to relieve symptoms. Medicines may help:  Reduce your fever.  Reduce your cough.  Relieve nasal congestion. HOME CARE INSTRUCTIONS   Take medicines only as directed by your health care provider.   Gargle warm saltwater or take cough drops to comfort your throat as directed by your health care provider.  Use a warm mist humidifier or inhale steam from a shower to increase air moisture. This may make it easier to breathe.  Drink enough fluid to keep your urine clear or pale yellow.   Eat soups and other clear broths and maintain good nutrition.   Rest as needed.   Return to work when your temperature has returned to normal or as your health care provider advises. You may need to stay home longer to avoid infecting others. You can also use a face mask and careful hand washing to prevent spread of the virus.  Increase the usage of  your inhaler if you have asthma.   Do not use any tobacco products, including cigarettes, chewing tobacco, or electronic cigarettes. If you need help quitting, ask your health care provider. PREVENTION  The best way to protect yourself from getting a cold is to practice good hygiene.  1. Avoid oral or hand contact with people with cold symptoms.  2. Wash your hands often if contact occurs.  There is no clear evidence that vitamin C, vitamin E, echinacea, or exercise reduces the  chance of developing a cold. However, it is always recommended to get plenty of rest, exercise, and practice good nutrition.  SEEK MEDICAL CARE IF:   You are getting worse rather than better.   Your symptoms are not controlled by medicine.   You have chills.  You have worsening shortness of breath.  You have brown or red mucus.  You have yellow or brown nasal discharge.  You have pain in your face, especially when you bend forward.  You have a fever.  You have swollen neck glands.  You have pain while swallowing.  You have white areas in the back of your throat. SEEK IMMEDIATE MEDICAL CARE IF:   You have severe or persistent:  Headache.  Ear pain.  Sinus pain.  Chest pain.  You have chronic lung disease and any of the following:  Wheezing.  Prolonged cough.  Coughing up blood.  A change in your usual mucus.  You have a stiff neck.  You have changes in your:  Vision.  Hearing.  Thinking.  Mood. MAKE SURE YOU:   Understand these instructions.  Will watch your condition.  Will get help right away if you are not doing well or get worse.   This information is not intended to replace advice given to you by your health care provider. Make sure you discuss any questions you have with your health care provider.   Document Released: 06/23/2000 Document Revised: 05/14/2014 Document Reviewed: 04/04/2013 Elsevier Interactive Patient Education 2016 Elsevier Inc[employer]  Chronic Obstructive Pulmonary Disease Chronic obstructive pulmonary disease (COPD) is a common lung condition in which airflow from the lungs is limited. COPD is a general term that can be used to describe many different lung problems that limit airflow, including both chronic bronchitis and emphysema. If you have COPD, your lung function will probably never return to normal, but there are measures you can take to improve lung function and make yourself feel better. CAUSES   Smoking  (common).  Exposure to secondhand smoke.  Genetic problems.  Chronic inflammatory lung diseases or recurrent infections. SYMPTOMS  Shortness of breath, especially with physical activity.  Deep, persistent (chronic) cough with a large amount of thick mucus.  Wheezing.  Rapid breaths (tachypnea).  Gray or bluish discoloration (cyanosis) of the skin, especially in your fingers, toes, or lips.  Fatigue.  Weight loss.  Frequent infections or episodes when breathing symptoms become much worse (exacerbations).  Chest tightness. DIAGNOSIS Your health care provider will take a medical history and perform a physical examination to diagnose COPD. Additional tests for COPD may include:  Lung (pulmonary) function tests.  Chest X-ray.  CT scan.  Blood tests. TREATMENT  Treatment for COPD may include:  Inhaler and nebulizer medicines. These help manage the symptoms of COPD and make your breathing more comfortable.  Supplemental oxygen. Supplemental oxygen is only helpful if you have a low oxygen level in your blood.  Exercise and physical activity. These are beneficial for nearly all people with COPD.  Lung surgery or transplant.  Nutrition therapy to gain weight, if you are underweight.  Pulmonary rehabilitation. This may involve working with a team of health care providers and specialists, such as respiratory, occupational, and physical therapists. HOME CARE INSTRUCTIONS  Take all medicines (inhaled or pills) as directed by your health care provider.  Avoid over-the-counter medicines or cough syrups that dry up your airway (such as antihistamines) and slow down the elimination of secretions unless instructed otherwise by your health care provider.  If you are a smoker, the most important thing that you can do is stop smoking. Continuing to smoke will cause further lung damage and breathing trouble. Ask your health care provider for help with quitting smoking. He or she can  direct you to community resources or hospitals that provide support.  Avoid exposure to irritants such as smoke, chemicals, and fumes that aggravate your breathing.  Use oxygen therapy and pulmonary rehabilitation if directed by your health care provider. If you require home oxygen therapy, ask your health care provider whether you should purchase a pulse oximeter to measure your oxygen level at home.  Avoid contact with individuals who have a contagious illness.  Avoid extreme temperature and humidity changes.  Eat healthy foods. Eating smaller, more frequent meals and resting before meals may help you maintain your strength.  Stay active, but balance activity with periods of rest. Exercise and physical activity will help you maintain your ability to do things you want to do.  Preventing infection and hospitalization is very important when you have COPD. Make sure to receive all the vaccines your health care provider recommends, especially the pneumococcal and influenza vaccines. Ask your health care provider whether you need a pneumonia vaccine.  Learn and use relaxation techniques to manage stress.  Learn and use controlled breathing techniques as directed by your health care provider. Controlled breathing techniques include:  Pursed lip breathing. Start by breathing in (inhaling) through your nose for 1 second. Then, purse your lips as if you were going to whistle and breathe out (exhale) through the pursed lips for 2 seconds.  Diaphragmatic breathing. Start by putting one hand on your abdomen just above your waist. Inhale slowly through your nose. The hand on your abdomen should move out. Then purse your lips and exhale slowly. You should be able to feel the hand on your abdomen moving in as you exhale.  Learn and use controlled coughing to clear mucus from your lungs. Controlled coughing is a series of short, progressive coughs. The steps of controlled coughing are: 3. Lean your head  slightly forward. 4. Breathe in deeply using diaphragmatic breathing. 5. Try to hold your breath for 3 seconds. 6. Keep your mouth slightly open while coughing twice. 7. Spit any mucus out into a tissue. 8. Rest and repeat the steps once or twice as needed. SEEK MEDICAL CARE IF:  You are coughing up more mucus than usual.  There is a change in the color or thickness of your mucus.  Your breathing is more labored than usual.  Your breathing is faster than usual. SEEK IMMEDIATE MEDICAL CARE IF:  You have shortness of breath while you are resting.  You have shortness of breath that prevents you from:  Being able to talk.  Performing your usual physical activities.  You have chest pain lasting longer than 5 minutes.  Your skin color is more cyanotic than usual.  You measure low oxygen saturations for longer than 5 minutes with a pulse oximeter. MAKE  SURE YOU:  Understand these instructions.  Will watch your condition.  Will get help right away if you are not doing well or get worse.   This information is not intended to replace advice given to you by your health care provider. Make sure you discuss any questions you have with your health care provider.   Document Released: 10/07/2004 Document Revised: 01/18/2014 Document Reviewed: 08/24/2012 Elsevier Interactive Patient Education [employer].

## 2014-12-06 NOTE — ED Notes (Signed)
Pt reports coughing up green sputum x1 week, denies fever. Pt reports hx of COPD and liver disease. Pt reports she does still smoke.

## 2014-12-06 NOTE — ED Provider Notes (Signed)
[facility] Emergency Department Provider Note  Time seen: 3:36 PM  I have reviewed the triage vital signs and the nursing notes.   HISTORY  Chief Complaint Shortness of Breath    HPI Sheri Barron is a 59 y.o. female the past medical history of COPD, CVA, anxiety, gastric reflux, who presents the emergency department difficulty breathing, cough and congestion. According to the patient for the past 3-4 days she has had increased cough, increased shortness breath over her baseline, as well as congestion. She states her cough was productive of green sputum today so she came to the emergency department for evaluation. Denies any chest pain. Denies fever.Describes her shortness of breath is moderate.     Past Medical History  Diagnosis Date  . COPD (chronic obstructive pulmonary disease) ([place])   . Stroke ([place])   . Depression   . Anxiety   . Chronic constipation   . GERD (gastroesophageal reflux disease)     There are no active problems to display for this patient.   Past Surgical History  Procedure Laterality Date  . Cholecystectomy    . Appendectomy    . Abdominal hysterectomy    . Egd-colonsocopy    . Colonoscopy with propofol N/A 09/04/2014    Procedure: COLONOSCOPY WITH PROPOFOL;  Surgeon: Manya Silvas, MD;  Location: [facility] ENDOSCOPY;  Service: Endoscopy;  Laterality: N/A;  . Esophagogastroduodenoscopy (egd) with propofol N/A 09/04/2014    Procedure: ESOPHAGOGASTRODUODENOSCOPY (EGD) WITH PROPOFOL;  Surgeon: Manya Silvas, MD;  Location: [facility] ENDOSCOPY;  Service: Endoscopy;  Laterality: N/A;  . Savory dilation N/A 09/04/2014    Procedure: SAVORY DILATION;  Surgeon: Manya Silvas, MD;  Location: [facility] ENDOSCOPY;  Service: Endoscopy;  Laterality: N/A;    Current Outpatient Rx  Name  Route  Sig  Dispense  Refill  . albuterol (PROVENTIL HFA;VENTOLIN HFA) 108 (90 BASE) MCG/ACT inhaler   Inhalation   Inhale into the lungs every 6 (six) hours  as needed for wheezing or shortness of breath.         Marland Kitchen aspirin 81 MG chewable tablet   Oral   Chew by mouth daily.         Marland Kitchen azelastine (ASTELIN) 0.1 % nasal spray   Each Nare   Place into both nostrils 2 (two) times daily. Use in each nostril as directed         . beclomethasone (QVAR) 80 MCG/ACT inhaler   Inhalation   Inhale into the lungs 2 (two) times daily.         . budesonide-formoterol (SYMBICORT) 160-4.5 MCG/ACT inhaler   Inhalation   Inhale 2 puffs into the lungs 2 (two) times daily.         . busPIRone (BUSPAR) 7.5 MG tablet   Oral   Take 7.5 mg by mouth 3 (three) times daily.         . fluticasone (FLONASE) 50 MCG/ACT nasal spray   Each Nare   Place into both nostrils daily.         . Ipratropium-Albuterol (COMBIVENT RESPIMAT) 20-100 MCG/ACT AERS respimat   Inhalation   Inhale 1 puff into the lungs every 6 (six) hours.         Marland Kitchen lactulose (CHRONULAC) 10 GM/15ML solution   Oral   Take 10 g by mouth 3 (three) times daily.         Marland Kitchen lidocaine (LIDODERM) 5 %   Transdermal   Place 1 patch onto the skin daily. Remove &  Discard patch within 12 hours or as directed by MD         . mometasone (ELOCON) 0.1 % lotion   Topical   Apply topically daily.         Marland Kitchen omeprazole (PRILOSEC) 40 MG capsule   Oral   Take 40 mg by mouth 2 (two) times daily.         . phenylephrine (,USE FOR PREPARATION-H,) 0.25 % suppository   Rectal   Place 1 suppository rectally 2 (two) times daily.         . propranolol (INDERAL) 20 MG tablet   Oral   Take 20 mg by mouth 3 (three) times daily.         . sucralfate (CARAFATE) 1 G tablet   Oral   Take 1 g by mouth 4 (four) times daily.         . traZODone (DESYREL) 100 MG tablet   Oral   Take 100 mg by mouth at bedtime.         Marland Kitchen venlafaxine XR (EFFEXOR-XR) 150 MG 24 hr capsule   Oral   Take 150 mg by mouth daily with breakfast.           Allergies Sulfa antibiotics  Family History   Problem Relation Age of Onset  . Breast cancer Neg Hx     Social History Social History  Substance Use Topics  . Smoking status: Current Every Day Smoker -- 1.00 packs/day  . Smokeless tobacco: None  . Alcohol Use: No    Review of Systems Constitutional: Negative for fever. Cardiovascular: Negative for chest pain. Respiratory: Positive shortness of breath. Positive wheeze. Gastrointestinal: Negative for abdominal pain Musculoskeletal: Negative for back pain.Marland Kitchen Neurological: Negative for headache 10-point ROS otherwise negative.  ____________________________________________   PHYSICAL EXAM:  VITAL SIGNS: ED Triage Vitals  Enc Vitals Group     BP 12/06/14 1243 156/88 mmHg     Pulse Rate 12/06/14 1243 84     Resp 12/06/14 1243 24     Temp 12/06/14 1243 98.2 F (36.8 C)     Temp Source 12/06/14 1243 Oral     SpO2 12/06/14 1243 94 %     Weight 12/06/14 1243 175 lb (79.379 kg)     Height 12/06/14 1243 5' (1.524 m)     Head Cir --      Peak Flow --      Pain Score 12/06/14 1243 10     Pain Loc --      Pain Edu? --      Excl. in [place]? --     Constitutional: Alert and oriented. Well appearing and in no distress. Eyes: Normal exam ENT   Head: Normocephalic and atraumatic.   Mouth/Throat: Mucous membranes are moist. Cardiovascular: Normal rate, regular rhythm. No murmur Respiratory: Mild tachypnea around 22 breaths per minute. Moderate wheeze. No rales or rhonchi Gastrointestinal: Soft and nontender. No distention.  Musculoskeletal: Nontender with normal range of motion in all extremities. Neurologic:  Normal speech and language. No gross focal neurologic deficits Skin:  Skin is warm, dry and intact.  Psychiatric: Mood and affect are normal. Speech and behavior are normal.   ____________________________________________    EKG  EKG reviewed and interpreted by myself shows normal sinus rhythm at 84 bpm, narrow QRS, normal axis, normal intervals, no ST changes.  Overall normal EKG.  ____________________________________________    RADIOLOGY  Chest x-ray shows no acute abnormality   INITIAL IMPRESSION / ASSESSMENT AND PLAN /  ED COURSE  Pertinent labs & imaging results that were available during my care of the patient were reviewed by me and considered in my medical decision making (see chart for details).  Labs within normal limits besides a mild leukocytosis. Troponin negative. Chest x-ray negative. Exam most consistent with bronchitis/COPD exacerbation. We will dose Solu-Medrol, DuoNeb times, Zithromax, and closely monitor in the emergency department. Patient has a current room air saturation of 94%. Anticipate likely discharge home with outpatient therapy.  Patient states she is feeling much better. We will discharge home with outpatient treatment.  ____________________________________________   FINAL CLINICAL IMPRESSION(S) / ED DIAGNOSES  COPD exacerbation Bronchitis   Harvest Dark, MD 12/06/14 [phone]

## 2015-03-11 ENCOUNTER — Encounter: Payer: Self-pay | Admitting: Dietician

## 2015-03-11 ENCOUNTER — Encounter: Payer: Medicare Other | Attending: Nurse Practitioner | Admitting: Dietician

## 2015-03-11 VITALS — Ht 60.0 in | Wt 165.9 lb

## 2015-03-11 DIAGNOSIS — J449 Chronic obstructive pulmonary disease, unspecified: Secondary | ICD-10-CM | POA: Insufficient documentation

## 2015-03-11 DIAGNOSIS — K219 Gastro-esophageal reflux disease without esophagitis: Secondary | ICD-10-CM | POA: Diagnosis not present

## 2015-03-11 DIAGNOSIS — K760 Fatty (change of) liver, not elsewhere classified: Secondary | ICD-10-CM | POA: Diagnosis present

## 2015-03-11 DIAGNOSIS — K5900 Constipation, unspecified: Secondary | ICD-10-CM | POA: Insufficient documentation

## 2015-03-11 DIAGNOSIS — Z8673 Personal history of transient ischemic attack (TIA), and cerebral infarction without residual deficits: Secondary | ICD-10-CM | POA: Insufficient documentation

## 2015-03-11 NOTE — Patient Instructions (Signed)
Establish a meal pattern of 3  small meals per day spaced 4-5 hours apart. Balance meals with 1-3 oz. of protein (refer to list), 1-2 starch servings, a fruit serving and free vegetables. Try Duke's lite mayonnaise, Kraft lite ranch dressing and low fat sour cream. Switch to unsweetened applesauce. Read labels for sodium and limit to 500-600 mg/meal.

## 2015-03-11 NOTE — Progress Notes (Signed)
Medical Nutrition Therapy: Visit start time:1330  end time: 1430 Assessment:  Diagnosis: hepatic steatosis Past medical history: COPD, hx of stroke, GERD, chronic constipation Psychosocial issues/ stress concerns: Patient rates her stress as moderate and indicates "ok" as to how well she is dealing with her stress. Preferred learning method:  . No preference indicated  Current weight: 165.9 lbs  Height: 60in Medications, supplements: see list Progress and evaluation:  Patient in for initial medical nutrition therapy visit. She reports she has made positive changes in her eating habits in past month, since being told she had "fatty liver". She reports a weight loss of approximately 10 lbs. She has decreased soda, Mt. Dew, from 5-6 (16oz) bottles to 1 per day. She and her daughter are baking meats verses frying. She is eating very few sweets. She does not add salt in cooking or at the table and reports she has significantly decreased processed foods. Physical activity: none; reports that back problems/pain limits exercise  Dietary Intake:  Usual eating pattern includes 2 meals and 1-2 snacks per day. Dining out frequency: 1 meals per week.  Breakfast: gets up at 10:00 and sips on [place]. Dew Lunch: 2:00pm- 2 packages of flavored oatmeal, Mt. Dew or lemon water or tuna salad sandwich Dinner: 6:00pm- baked chicken or boneless pork chop, rice/corn combination, greens (fresh with vinegar; no salt or fat added), lemon water or baked spaghetti Snack: usually no snack after dinner Beverages: [place] (16oz), water with lemon added  Nutrition Care Education: Fatty Liver: Commended patient on the positive diet changes she has already made.  Instructed on dietary guidelines for fatty liver (low fat, especially saturated fat, low sodium and rich in fruits/vegetables) emphasizing that weight loss is important but a rate of 1 lb per week recommended over fast weight loss. Instruction included identifying  protein, carbohydrate foods and non-starchy vegetables and showing how to balance using food models and food guide plate. Also, wrote out sample meals based on patient's food preferences and foods available. Discussed importance of establishing a consistent meal pattern of 3 meals. Also, discussed ways to increase fiber in the diet stressing need for adequate water intake when increasing fiber. Nutritional Diagnosis:  -3.3 Overweight/obesity As related to history of excessive calories from sweetened beverages and higher fat choices.  As evidenced by diet history and reported weight gain of 30 lbs in past 2 years..  Intervention:  Establish a meal pattern of 3  small meals per day spaced 4-5 hours apart. Balance meals with 1-3 oz. of protein (refer to list), 1-2 starch servings, a fruit serving and free vegetables. Try Duke's lite mayonnaise, Kraft lite ranch dressing and low fat sour cream. Switch to unsweetened applesauce. Read labels for sodium and limit to 500-600 mg/meal.   Education Materials given:  . Food lists/ Planning A Balanced Meal . Sample meal pattern/ menus . List of foods and fiber content . Goals/ instructions Learner/ who was taught:  . Patient  Level of understanding: . Partial understanding; needs review/ practice Learning barriers: . None Willingness to learn/ readiness for change: . Eager, change in progress  Monitoring and Evaluation:  No follow-up scheduled. Patient wants to follow recommendations and call if desires further help with her diet/nutrition.

## 2015-11-14 ENCOUNTER — Encounter: Payer: Self-pay | Admitting: Emergency Medicine

## 2015-11-14 ENCOUNTER — Emergency Department: Payer: Medicare Other

## 2015-11-14 ENCOUNTER — Inpatient Hospital Stay
Admission: EM | Admit: 2015-11-14 | Discharge: 2015-11-16 | DRG: 191 | Discharge status: Against Medical Advice | Payer: Medicare Other | Attending: Internal Medicine | Admitting: Internal Medicine

## 2015-11-14 DIAGNOSIS — R197 Diarrhea, unspecified: Secondary | ICD-10-CM | POA: Diagnosis present

## 2015-11-14 DIAGNOSIS — Z9049 Acquired absence of other specified parts of digestive tract: Secondary | ICD-10-CM

## 2015-11-14 DIAGNOSIS — E119 Type 2 diabetes mellitus without complications: Secondary | ICD-10-CM | POA: Diagnosis present

## 2015-11-14 DIAGNOSIS — F17210 Nicotine dependence, cigarettes, uncomplicated: Secondary | ICD-10-CM | POA: Diagnosis present

## 2015-11-14 DIAGNOSIS — E876 Hypokalemia: Secondary | ICD-10-CM | POA: Diagnosis present

## 2015-11-14 DIAGNOSIS — Z8673 Personal history of transient ischemic attack (TIA), and cerebral infarction without residual deficits: Secondary | ICD-10-CM | POA: Diagnosis not present

## 2015-11-14 DIAGNOSIS — J9610 Chronic respiratory failure, unspecified whether with hypoxia or hypercapnia: Secondary | ICD-10-CM | POA: Diagnosis present

## 2015-11-14 DIAGNOSIS — R0602 Shortness of breath: Secondary | ICD-10-CM | POA: Diagnosis not present

## 2015-11-14 DIAGNOSIS — K219 Gastro-esophageal reflux disease without esophagitis: Secondary | ICD-10-CM | POA: Diagnosis present

## 2015-11-14 DIAGNOSIS — J440 Chronic obstructive pulmonary disease with acute lower respiratory infection: Secondary | ICD-10-CM | POA: Diagnosis not present

## 2015-11-14 DIAGNOSIS — J441 Chronic obstructive pulmonary disease with (acute) exacerbation: Secondary | ICD-10-CM | POA: Diagnosis present

## 2015-11-14 DIAGNOSIS — Z9981 Dependence on supplemental oxygen: Secondary | ICD-10-CM

## 2015-11-14 DIAGNOSIS — K5909 Other constipation: Secondary | ICD-10-CM | POA: Diagnosis present

## 2015-11-14 DIAGNOSIS — G250 Essential tremor: Secondary | ICD-10-CM | POA: Diagnosis present

## 2015-11-14 DIAGNOSIS — J209 Acute bronchitis, unspecified: Secondary | ICD-10-CM | POA: Diagnosis present

## 2015-11-14 DIAGNOSIS — I10 Essential (primary) hypertension: Secondary | ICD-10-CM | POA: Diagnosis present

## 2015-11-14 DIAGNOSIS — F419 Anxiety disorder, unspecified: Secondary | ICD-10-CM | POA: Diagnosis present

## 2015-11-14 DIAGNOSIS — F329 Major depressive disorder, single episode, unspecified: Secondary | ICD-10-CM | POA: Diagnosis present

## 2015-11-14 LAB — CBC
HCT: 41.1 % (ref 35.0–47.0)
Hemoglobin: 13.5 g/dL (ref 12.0–16.0)
MCH: 29 pg (ref 26.0–34.0)
MCHC: 32.9 g/dL (ref 32.0–36.0)
MCV: 88 fL (ref 80.0–100.0)
PLATELETS: 346 10*3/uL (ref 150–440)
RBC: 4.67 MIL/uL (ref 3.80–5.20)
RDW: 15.2 % — AB (ref 11.5–14.5)
WBC: 18.7 10*3/uL — ABNORMAL HIGH (ref 3.6–11.0)

## 2015-11-14 LAB — MAGNESIUM: MAGNESIUM: 1.4 mg/dL — AB (ref 1.7–2.4)

## 2015-11-14 LAB — TROPONIN I

## 2015-11-14 LAB — BASIC METABOLIC PANEL
Anion gap: 14 (ref 5–15)
BUN: 9 mg/dL (ref 6–20)
CALCIUM: 8.5 mg/dL — AB (ref 8.9–10.3)
CO2: 39 mmol/L — AB (ref 22–32)
CREATININE: 1.01 mg/dL — AB (ref 0.44–1.00)
Chloride: 84 mmol/L — ABNORMAL LOW (ref 101–111)
GFR calc Af Amer: >60 mL/min (ref >60)
GFR, EST NON AFRICAN AMERICAN: 60 mL/min — AB (ref >60)
GLUCOSE: 189 mg/dL — AB (ref 65–99)
Potassium: <2 mmol/L — CL (ref 3.5–5.1)
Sodium: 137 mmol/L (ref 135–145)

## 2015-11-14 MED ORDER — POTASSIUM CHLORIDE 2 MEQ/ML IV SOLN
30.0000 meq | Freq: Once | INTRAVENOUS | Status: AC
  Administered 2015-11-14: 30 meq via INTRAVENOUS
  Filled 2015-11-14 (×2): qty 15

## 2015-11-14 MED ORDER — METHYLPREDNISOLONE SODIUM SUCC 125 MG IJ SOLR
125.0000 mg | Freq: Once | INTRAMUSCULAR | Status: AC
  Administered 2015-11-14: 125 mg via INTRAVENOUS
  Filled 2015-11-14: qty 2

## 2015-11-14 MED ORDER — LEVALBUTEROL HCL 0.63 MG/3ML IN NEBU
0.6300 mg | INHALATION_SOLUTION | Freq: Four times a day (QID) | RESPIRATORY_TRACT | Status: DC
  Administered 2015-11-14 – 2015-11-16 (×7): 0.63 mg via RESPIRATORY_TRACT
  Filled 2015-11-14 (×8): qty 3

## 2015-11-14 MED ORDER — PANTOPRAZOLE SODIUM 40 MG PO TBEC
DELAYED_RELEASE_TABLET | ORAL | Status: AC
Start: 2015-11-14 — End: 2015-11-14
  Administered 2015-11-14: 40 mg via ORAL
  Filled 2015-11-14: qty 1

## 2015-11-14 MED ORDER — POLYETHYLENE GLYCOL 3350 17 G PO PACK
17.0000 g | PACK | Freq: Every day | ORAL | Status: DC
  Filled 2015-11-14: qty 1

## 2015-11-14 MED ORDER — FLUTICASONE PROPIONATE 50 MCG/ACT NA SUSP
1.0000 | Freq: Every day | NASAL | Status: DC
  Administered 2015-11-15 – 2015-11-16 (×2): 1 via NASAL
  Filled 2015-11-14: qty 16

## 2015-11-14 MED ORDER — IPRATROPIUM-ALBUTEROL 0.5-2.5 (3) MG/3ML IN SOLN
3.0000 mL | Freq: Once | RESPIRATORY_TRACT | Status: AC
  Administered 2015-11-14: 3 mL via RESPIRATORY_TRACT
  Filled 2015-11-14: qty 3

## 2015-11-14 MED ORDER — ASPIRIN 81 MG PO CHEW
81.0000 mg | CHEWABLE_TABLET | Freq: Every day | ORAL | Status: DC
  Filled 2015-11-14: qty 1

## 2015-11-14 MED ORDER — ACETAMINOPHEN 650 MG RE SUPP: 650.0000 mg | Freq: Four times a day (QID) | RECTAL | Status: DC | PRN

## 2015-11-14 MED ORDER — LACTULOSE 10 GM/15ML PO SOLN
10.0000 g | Freq: Three times a day (TID) | ORAL | Status: DC
  Administered 2015-11-14: 10 g via ORAL
  Filled 2015-11-14: qty 30

## 2015-11-14 MED ORDER — GABAPENTIN 100 MG PO CAPS
100.0000 mg | ORAL_CAPSULE | Freq: Three times a day (TID) | ORAL | Status: DC
  Administered 2015-11-14 – 2015-11-16 (×7): 100 mg via ORAL
  Filled 2015-11-14 (×7): qty 1

## 2015-11-14 MED ORDER — SUCRALFATE 1 G PO TABS
1.0000 g | ORAL_TABLET | Freq: Four times a day (QID) | ORAL | Status: DC
  Administered 2015-11-14 – 2015-11-16 (×8): 1 g via ORAL
  Filled 2015-11-14 (×7): qty 1

## 2015-11-14 MED ORDER — ONDANSETRON HCL 4 MG/2ML IJ SOLN
4.0000 mg | Freq: Four times a day (QID) | INTRAMUSCULAR | Status: DC | PRN
  Administered 2015-11-15: 4 mg via INTRAVENOUS
  Filled 2015-11-14 (×2): qty 2

## 2015-11-14 MED ORDER — PANTOPRAZOLE SODIUM 40 MG PO TBEC
40.0000 mg | DELAYED_RELEASE_TABLET | Freq: Every day | ORAL | Status: DC
  Administered 2015-11-14 – 2015-11-15 (×2): 40 mg via ORAL
  Filled 2015-11-14: qty 1

## 2015-11-14 MED ORDER — DEXTROSE 5 % IV SOLN: 1.0000 g | INTRAVENOUS | Status: DC

## 2015-11-14 MED ORDER — LORATADINE 10 MG PO TABS
10.0000 mg | ORAL_TABLET | Freq: Every day | ORAL | Status: DC
  Administered 2015-11-15 – 2015-11-16 (×2): 10 mg via ORAL
  Filled 2015-11-14 (×2): qty 1

## 2015-11-14 MED ORDER — VENLAFAXINE HCL ER 75 MG PO CP24
150.0000 mg | ORAL_CAPSULE | Freq: Every day | ORAL | Status: DC
  Administered 2015-11-15 – 2015-11-16 (×2): 150 mg via ORAL
  Filled 2015-11-14 (×2): qty 2

## 2015-11-14 MED ORDER — ASPIRIN 325 MG PO TABS
325.0000 mg | ORAL_TABLET | Freq: Every day | ORAL | Status: DC
  Administered 2015-11-15 – 2015-11-16 (×2): 325 mg via ORAL
  Filled 2015-11-14 (×3): qty 1

## 2015-11-14 MED ORDER — AZITHROMYCIN 500 MG PO TABS
500.0000 mg | ORAL_TABLET | Freq: Once | ORAL | Status: AC
  Administered 2015-11-14: 500 mg via ORAL
  Filled 2015-11-14: qty 1

## 2015-11-14 MED ORDER — TIOTROPIUM BROMIDE MONOHYDRATE 18 MCG IN CAPS
18.0000 ug | ORAL_CAPSULE | Freq: Every day | RESPIRATORY_TRACT | Status: DC
  Filled 2015-11-14: qty 5

## 2015-11-14 MED ORDER — ACETAMINOPHEN 325 MG PO TABS: 650.0000 mg | ORAL_TABLET | Freq: Four times a day (QID) | ORAL | Status: DC | PRN

## 2015-11-14 MED ORDER — TRAZODONE HCL 100 MG PO TABS
100.0000 mg | ORAL_TABLET | Freq: Every day | ORAL | Status: DC
  Administered 2015-11-14: 100 mg via ORAL
  Filled 2015-11-14: qty 1

## 2015-11-14 MED ORDER — AZELASTINE HCL 0.1 % NA SOLN
1.0000 | Freq: Two times a day (BID) | NASAL | Status: DC
  Administered 2015-11-15 – 2015-11-16 (×2): 1 via NASAL
  Filled 2015-11-14 (×2): qty 30

## 2015-11-14 MED ORDER — POTASSIUM CHLORIDE IN NACL 20-0.9 MEQ/L-% IV SOLN
Freq: Once | INTRAVENOUS | Status: AC
  Administered 2015-11-14: 19:00:00 via INTRAVENOUS
  Filled 2015-11-14: qty 1000

## 2015-11-14 MED ORDER — SODIUM CHLORIDE 0.9 % IV SOLN
INTRAVENOUS | Status: DC
  Administered 2015-11-15: 02:00:00 via INTRAVENOUS

## 2015-11-14 MED ORDER — INFLUENZA VAC SPLIT QUAD 0.5 ML IM SUSY: 0.5000 mL | PREFILLED_SYRINGE | INTRAMUSCULAR | Status: DC

## 2015-11-14 MED ORDER — POTASSIUM CHLORIDE CRYS ER 20 MEQ PO TBCR
40.0000 meq | EXTENDED_RELEASE_TABLET | Freq: Once | ORAL | Status: AC
  Administered 2015-11-14: 40 meq via ORAL
  Filled 2015-11-14: qty 2

## 2015-11-14 MED ORDER — TRAZODONE HCL 50 MG PO TABS: 25.0000 mg | ORAL_TABLET | Freq: Every evening | ORAL | Status: DC | PRN

## 2015-11-14 MED ORDER — PROPRANOLOL HCL 20 MG PO TABS
40.0000 mg | ORAL_TABLET | Freq: Two times a day (BID) | ORAL | Status: DC
  Administered 2015-11-14 – 2015-11-16 (×5): 40 mg via ORAL
  Filled 2015-11-14 (×6): qty 2

## 2015-11-14 MED ORDER — GABAPENTIN 100 MG PO CAPS: 100.0000 mg | ORAL_CAPSULE | Freq: Two times a day (BID) | ORAL | Status: DC

## 2015-11-14 MED ORDER — BISACODYL 5 MG PO TBEC: 5.0000 mg | DELAYED_RELEASE_TABLET | Freq: Every day | ORAL | Status: DC | PRN

## 2015-11-14 MED ORDER — URSODIOL 300 MG PO CAPS
300.0000 mg | ORAL_CAPSULE | Freq: Two times a day (BID) | ORAL | Status: DC
  Administered 2015-11-15 – 2015-11-16 (×4): 300 mg via ORAL
  Filled 2015-11-14 (×5): qty 1

## 2015-11-14 MED ORDER — METHYLPREDNISOLONE SODIUM SUCC 125 MG IJ SOLR
60.0000 mg | INTRAMUSCULAR | Status: DC
  Administered 2015-11-14 – 2015-11-16 (×3): 60 mg via INTRAVENOUS
  Filled 2015-11-14 (×2): qty 2

## 2015-11-14 MED ORDER — SENNA 8.6 MG PO TABS: 1.0000 | ORAL_TABLET | Freq: Every day | ORAL | Status: DC

## 2015-11-14 MED ORDER — CEFTRIAXONE SODIUM-DEXTROSE 1-3.74 GM-% IV SOLR
1.0000 g | INTRAVENOUS | Status: DC
  Administered 2015-11-14 – 2015-11-16 (×3): 1 g via INTRAVENOUS
  Filled 2015-11-14 (×3): qty 50

## 2015-11-14 MED ORDER — ENOXAPARIN SODIUM 40 MG/0.4ML ~~LOC~~ SOLN
40.0000 mg | SUBCUTANEOUS | Status: DC
  Administered 2015-11-14 – 2015-11-15 (×2): 40 mg via SUBCUTANEOUS
  Filled 2015-11-14 (×2): qty 0.4

## 2015-11-14 MED ORDER — PHENYLEPHRINE IN HARD FAT 0.25 % RE SUPP
1.0000 | Freq: Two times a day (BID) | RECTAL | Status: DC
  Filled 2015-11-14 (×3): qty 1

## 2015-11-14 MED ORDER — LINACLOTIDE 145 MCG PO CAPS
145.0000 ug | ORAL_CAPSULE | Freq: Every day | ORAL | Status: DC
  Administered 2015-11-15 – 2015-11-16 (×2): 145 ug via ORAL
  Filled 2015-11-14 (×3): qty 1

## 2015-11-14 MED ORDER — METFORMIN HCL 500 MG PO TABS
500.0000 mg | ORAL_TABLET | Freq: Two times a day (BID) | ORAL | Status: DC
  Administered 2015-11-15 – 2015-11-16 (×4): 500 mg via ORAL
  Filled 2015-11-14 (×4): qty 1

## 2015-11-14 MED ORDER — DEXTROSE 5 % IV SOLN: 500.0000 mg | INTRAVENOUS | Status: DC

## 2015-11-14 MED ORDER — MOMETASONE FURO-FORMOTEROL FUM 200-5 MCG/ACT IN AERO
2.0000 | INHALATION_SPRAY | Freq: Two times a day (BID) | RESPIRATORY_TRACT | Status: DC
Start: 2015-11-14 — End: 2015-11-17
  Administered 2015-11-15 – 2015-11-16 (×4): 2 via RESPIRATORY_TRACT
  Filled 2015-11-14 (×2): qty 8.8

## 2015-11-14 MED ORDER — ONDANSETRON HCL 4 MG PO TABS
4.0000 mg | ORAL_TABLET | Freq: Four times a day (QID) | ORAL | Status: DC | PRN
Start: 2015-11-14 — End: 2015-11-16
  Administered 2015-11-15: 4 mg via ORAL
  Filled 2015-11-14: qty 1

## 2015-11-14 MED ORDER — PNEUMOCOCCAL VAC POLYVALENT 25 MCG/0.5ML IJ INJ: 0.5000 mL | INJECTION | INTRAMUSCULAR | Status: DC

## 2015-11-14 MED ORDER — METHYLPREDNISOLONE SODIUM SUCC 125 MG IJ SOLR
INTRAMUSCULAR | Status: AC
  Filled 2015-11-14: qty 2

## 2015-11-14 MED ORDER — ENOXAPARIN SODIUM 40 MG/0.4ML ~~LOC~~ SOLN
SUBCUTANEOUS | Status: AC
  Filled 2015-11-14: qty 0.4

## 2015-11-14 MED ORDER — ASPIRIN EC 325 MG PO TBEC
DELAYED_RELEASE_TABLET | ORAL | Status: AC
  Filled 2015-11-14: qty 1

## 2015-11-14 MED ORDER — BUSPIRONE HCL 5 MG PO TABS
7.5000 mg | ORAL_TABLET | Freq: Three times a day (TID) | ORAL | Status: DC
  Administered 2015-11-15 – 2015-11-16 (×6): 7.5 mg via ORAL
  Filled 2015-11-14 (×4): qty 2
  Filled 2015-11-14 (×2): qty 1
  Filled 2015-11-14 (×2): qty 2

## 2015-11-14 MED ORDER — DOCUSATE SODIUM 100 MG PO CAPS
100.0000 mg | ORAL_CAPSULE | Freq: Two times a day (BID) | ORAL | Status: DC
  Administered 2015-11-15: 100 mg via ORAL
  Filled 2015-11-14 (×2): qty 1

## 2015-11-14 NOTE — ED Triage Notes (Signed)
Pt comes into the ED via EMS from home c/o shortness of breath and a productive cough with green mucous.  Patient has h/o COPD, DM, and HTN.  Wears 2 L nasal cannula at home during nighttime.  Patient A&Ox4 and in NAD at this time.  128/64, 90 HR.  Patient presents with diminished right sided lung sounds anterior and posterior.

## 2015-11-14 NOTE — ED Notes (Signed)
Pt was on 4L nasal cannula, states at night she usually wears 2L. Attempting to wean pt back down to 2L. Will monitor oxygen saturation. Pt states she is breathing better, coughing at this time. Speaking in complete sentences.

## 2015-11-14 NOTE — H&P (Signed)
[place] [facility] [place] NAME: Sheri Barron    MR#:  [id number]  DATE OF BIRTH:  05/08/1955  DATE OF ADMISSION:  11/14/2015  PRIMARY CARE PHYSICIAN: [place]   REQUESTING/REFERRING PHYSICIAN:  CHIEF COMPLAINT:sob   Chief Complaint  Patient presents with  . Shortness of Breath  . Cough    HISTORY OF PRESENT ILLNESS:  Sheri Barron  is a 60 y.o. female with a known history of  Essential hypertension, benign essential tremors came in because of shortness of breath, cough, vomiting, epigastric pain. Patient started to have shortness of breath, cough since yesterday, same time she started to have nausea, vomiting, epigastric pain. She was given nebulizers, steroids for wheezing, chest x-ray is clear. Labs showed severe hypokalemia. Patient says she feels little better.  PAST MEDICAL HISTORY:   Past Medical History:  Diagnosis Date  . Anxiety   . Chronic constipation   . COPD (chronic obstructive pulmonary disease) ([place])   . Depression   . GERD (gastroesophageal reflux disease)   . Stroke [facility])     PAST SURGICAL HISTOIRY:   Past Surgical History:  Procedure Laterality Date  . ABDOMINAL HYSTERECTOMY    . APPENDECTOMY    . CHOLECYSTECTOMY    . COLONOSCOPY WITH PROPOFOL N/A 09/04/2014   Procedure: COLONOSCOPY WITH PROPOFOL;  Surgeon: Manya Silvas, MD;  Location: [facility] ENDOSCOPY;  Service: Endoscopy;  Laterality: N/A;  . EGD-colonsocopy    . ESOPHAGOGASTRODUODENOSCOPY (EGD) WITH PROPOFOL N/A 09/04/2014   Procedure: ESOPHAGOGASTRODUODENOSCOPY (EGD) WITH PROPOFOL;  Surgeon: Manya Silvas, MD;  Location[facility] ENDOSCOPY;  Service: Endoscopy;  Laterality: N/A;  . SAVORY DILATION N/A 09/04/2014   Procedure: SAVORY DILATION;  Surgeon: Manya Silvas, MD;  Location: [facility] ENDOSCOPY;  Service: Endoscopy;  Laterality: N/A;    SOCIAL HISTORY:   Social History  Substance Use Topics  . Smoking status: Current  Every Day Smoker    Packs/day: 0.50    Types: Cigarettes  . Smokeless tobacco: Never Used  . Alcohol use No    FAMILY HISTORY:   Family History  Problem Relation Age of Onset  . Breast cancer Neg Hx     DRUG ALLERGIES:   Allergies  Allergen Reactions  . Sulfa Antibiotics Nausea Only    Patient states she gets dehydrated with sulfa as well.    REVIEW OF SYSTEMS:  CONSTITUTIONAL: No fever, fatigue or weakness.  EYES: No blurred or double vision.  EARS, NOSE, AND THROAT: No tinnitus or ear pain.  RESPIRATORY: Cough, shortness of breath, wheezing  CARDIOVASCULAR: No chest pain, orthopnea, edema.  GASTROINTESTINAL: Nausea, vomiting, midepigastric pain GENITOURINARY: No dysuria, hematuria.  ENDOCRINE: No polyuria, nocturia,  HEMATOLOGY: No anemia, easy bruising or bleeding SKIN: No rash or lesion. MUSCULOSKELETAL: No joint pain or arthritis.   NEUROLOGIC: No tingling, numbness, weakness.  PSYCHIATRY: No anxiety or depression.   MEDICATIONS AT HOME:   Prior to Admission medications   Medication Sig Start Date End Date Taking? Authorizing Provider  albuterol (PROVENTIL HFA;VENTOLIN HFA) 108 (90 BASE) MCG/ACT inhaler Inhale 2 puffs into the lungs every 6 (six) hours as needed for wheezing or shortness of breath. 12/06/14  Yes Harvest Dark, MD  azelastine (ASTELIN) 0.1 % nasal spray Place 2 sprays into both nostrils 2 (two) times daily. Use in each nostril as directed    Yes Historical Provider, MD  beclomethasone (QVAR) 80 MCG/ACT inhaler Inhale 2 puffs into the lungs 2 (two) times daily.  Yes Historical Provider, MD  fluticasone (FLONASE) 50 MCG/ACT nasal spray Place 1 spray into both nostrils 2 (two) times daily.    Yes Historical Provider, MD  propranolol (INDERAL) 20 MG tablet Take 20 mg by mouth 3 (three) times daily.   Yes Historical Provider, MD  sucralfate (CARAFATE) 1 G tablet Take 1 g by mouth 4 (four) times daily -  before meals and at bedtime.    Yes Historical  Provider, MD  traZODone (DESYREL) 100 MG tablet Take 300 mg by mouth at bedtime.    Yes Historical Provider, MD  venlafaxine XR (EFFEXOR-XR) 150 MG 24 hr capsule Take 150 mg by mouth daily with breakfast.   Yes Historical Provider, MD  budesonide-formoterol (SYMBICORT) 160-4.5 MCG/ACT inhaler Inhale 2 puffs into the lungs 2 (two) times daily.    Historical Provider, MD  busPIRone (BUSPAR) 7.5 MG tablet Take 7.5 mg by mouth 3 (three) times daily.    Historical Provider, MD  Ipratropium-Albuterol (COMBIVENT RESPIMAT) 20-100 MCG/ACT AERS respimat Inhale 1 puff into the lungs every 6 (six) hours.    Historical Provider, MD  lactulose (CHRONULAC) 10 GM/15ML solution Take 10 g by mouth 3 (three) times daily.    Historical Provider, MD  lidocaine (LIDODERM) 5 % Place 1 patch onto the skin daily. Remove & Discard patch within 12 hours or as directed by MD    Historical Provider, MD  mometasone (ELOCON) 0.1 % lotion Apply topically daily.    Historical Provider, MD  omeprazole (PRILOSEC) 40 MG capsule Take 40 mg by mouth 2 (two) times daily.    Historical Provider, MD  phenylephrine (,USE FOR PREPARATION-H,) 0.25 % suppository Place 1 suppository rectally 2 (two) times daily.    Historical Provider, MD  predniSONE (DELTASONE) 20 MG tablet Take 2 tablets (40 mg total) by mouth daily. 12/06/14   Harvest Dark, MD      VITAL SIGNS:  Blood pressure (!) 128/50, pulse (!) 110, resp. rate 18, height 5' (1.524 m), weight 68.9 kg (152 lb), SpO2 95 %.  PHYSICAL EXAMINATION:  GENERAL:  60 y.o.-year-old patient lying in the bed with no acute distress.  EYES: Pupils equal, round, reactive to light and accommodation. No scleral icterus. Extraocular muscles intact.  HEENT: Head atraumatic, normocephalic. Oropharynx and nasopharynx clear.  NECK:  Supple, no jugular venous distention. No thyroid enlargement, no tenderness.  LUNGS:  Faint expiratory wheezing present in both lungs CARDIOVASCULAR: S1, S2  Tachy. . No  murmurs, rubs, or gallops.  ABDOMEN: Soft, nontender, nondistended. Bowel sounds present. No organomegaly or mass.  EXTREMITIES: No pedal edema, cyanosis, or clubbing.  NEUROLOGIC: Cranial nerves II through XII are intact. Muscle strength 5/5 in all extremities. Sensation intact. Gait not checked.  PSYCHIATRIC: The patient is alert and oriented x 3.  SKIN: No obvious rash, lesion, or ulcer.   LABORATORY PANEL:   CBC  Recent Labs Lab 11/14/15 1645  WBC 18.7*  HGB 13.5  HCT 41.1  PLT 346   ------------------------------------------------------------------------------------------------------------------  Chemistries   Recent Labs Lab 11/14/15 1645  NA 137  K <2.0*  CL 84*  CO2 39*  GLUCOSE 189*  BUN 9  CREATININE 1.01*  CALCIUM 8.5*  MG 1.4*   ------------------------------------------------------------------------------------------------------------------  Cardiac Enzymes  Recent Labs Lab 11/14/15 1645  TROPONINI <0.03   ------------------------------------------------------------------------------------------------------------------  RADIOLOGY:  Dg Chest 2 View  Result Date: 11/14/2015 CLINICAL DATA:  Shortness of breath, productive cough for 2 days. History of COPD, diabetes and hypertension. EXAM: CHEST  2 VIEW COMPARISON:  Chest radiograph December 06, 2014 FINDINGS: Cardiomediastinal silhouette is normal. No pleural effusions or focal consolidations. Trachea projects midline and there is no pneumothorax. Soft tissue planes and included osseous structures are non-suspicious. Surgical clips in the included right abdomen compatible with cholecystectomy. Mild degenerative change of thoracic spine. IMPRESSION: Normal chest radiograph. Electronically Signed   By: Elon Alas M.D.   On: 11/14/2015 17:09    EKG:   Orders placed or performed during the hospital encounter of 11/14/15  . ED EKG  . ED EKG  . EKG 12-Lead  . EKG 12-Lead  Sinus tachycardia at 1  11 bpm.  IMPRESSION AND PLAN:  1. Hypokalemia secondary to intractable nausea, vomiting, and GI losses. Patient says that she was initially constipated but now on patient was given lots of stool softeners to help her with constipation and now she feels like her sole low potassium is secondary to combination of Laxatives, nausea, vomiting.: Admit to telemetry, replace the potassium, recheck it in the morning, check magnesium as well. #2 acute bronchitis with wheezing: Chest x-ray negative for pneumonia. Started on nebulizers, empiric steroids, empiric antibiotics. WBC is elevated. #3 history of benign tremors: Patient is on Neurontin, propranolol, restarted them. #4 history of tobacco abuse: Patient still smokes I have counseled her again the smoking for about 10 minutes, we will offer a nicotine patch. #5 acute gastritis causing nausea, vomiting, epigastric pain: Started on IV PPIs. #6 chronic respiratory failure: On on oxygen at home.    All the records are reviewed and case discussed with ED provider. Management plans discussed with the patient, family and they are in agreement.  CODE STATUS: full  TOTAL TIME TAKING CARE OF THIS PATIENT:55 minutes.    Epifanio Lesches M.D on 11/14/2015 at 7:21 PM  Between 7am to 6pm - Pager - [phone]  After 6pm go to [email or web address] - password [email or web address] [facility]  [facility]  Office  [phone]  CC: Primary care physician; [facility]  Note: This dictation was prepared with Dragon dictation along with smaller phrase technology. Any transcriptional errors that result from this process are unintentional.

## 2015-11-14 NOTE — ED Provider Notes (Signed)
[facility] Emergency Department Provider Note  Time seen: 5:31 PM  I have reviewed the triage vital signs and the nursing notes.   HISTORY  Chief Complaint Shortness of Breath and Cough    HPI Sheri Barron is a 60 y.o. female with a past medical history of COPD, depression, gastric reflux, CVA who presents the emergency department for difficulty breathing and green sputum production. According to the patient for the last 2-3 days she has been having increased shortness of breath. She states since yesterday she has been coughing up a very thick green sputum. Patient states chills but has not measured a temperature at home. States nausea with vomiting today which brought her to the emergency department. Denies any chest pain. States mild upper abdominal pain but only when coughing.  Past Medical History:  Diagnosis Date  . Anxiety   . Chronic constipation   . COPD (chronic obstructive pulmonary disease) ([place])   . Depression   . GERD (gastroesophageal reflux disease)   . Stroke [facility])     There are no active problems to display for this patient.   Past Surgical History:  Procedure Laterality Date  . ABDOMINAL HYSTERECTOMY    . APPENDECTOMY    . CHOLECYSTECTOMY    . COLONOSCOPY WITH PROPOFOL N/A 09/04/2014   Procedure: COLONOSCOPY WITH PROPOFOL;  Surgeon: Manya Silvas, MD;  Location: [facility] ENDOSCOPY;  Service: Endoscopy;  Laterality: N/A;  . EGD-colonsocopy    . ESOPHAGOGASTRODUODENOSCOPY (EGD) WITH PROPOFOL N/A 09/04/2014   Procedure: ESOPHAGOGASTRODUODENOSCOPY (EGD) WITH PROPOFOL;  Surgeon: Manya Silvas, MD;  Location: [facility] ENDOSCOPY;  Service: Endoscopy;  Laterality: N/A;  . SAVORY DILATION N/A 09/04/2014   Procedure: SAVORY DILATION;  Surgeon: Manya Silvas, MD;  Location[facility] ENDOSCOPY;  Service: Endoscopy;  Laterality: N/A;    Prior to Admission medications   Medication Sig Start Date End Date Taking? Authorizing Provider  albuterol  (PROVENTIL HFA;VENTOLIN HFA) 108 (90 BASE) MCG/ACT inhaler Inhale 2 puffs into the lungs every 6 (six) hours as needed for wheezing or shortness of breath. 12/06/14   Harvest Dark, MD  aspirin 81 MG chewable tablet Chew by mouth daily.    Historical Provider, MD  azelastine (ASTELIN) 0.1 % nasal spray Place into both nostrils 2 (two) times daily. Use in each nostril as directed    Historical Provider, MD  beclomethasone (QVAR) 80 MCG/ACT inhaler Inhale into the lungs 2 (two) times daily.    Historical Provider, MD  budesonide-formoterol (SYMBICORT) 160-4.5 MCG/ACT inhaler Inhale 2 puffs into the lungs 2 (two) times daily.    Historical Provider, MD  busPIRone (BUSPAR) 7.5 MG tablet Take 7.5 mg by mouth 3 (three) times daily.    Historical Provider, MD  fluticasone (FLONASE) 50 MCG/ACT nasal spray Place into both nostrils daily.    Historical Provider, MD  Ipratropium-Albuterol (COMBIVENT RESPIMAT) 20-100 MCG/ACT AERS respimat Inhale 1 puff into the lungs every 6 (six) hours.    Historical Provider, MD  lactulose (CHRONULAC) 10 GM/15ML solution Take 10 g by mouth 3 (three) times daily.    Historical Provider, MD  lidocaine (LIDODERM) 5 % Place 1 patch onto the skin daily. Remove & Discard patch within 12 hours or as directed by MD    Historical Provider, MD  mometasone (ELOCON) 0.1 % lotion Apply topically daily.    Historical Provider, MD  omeprazole (PRILOSEC) 40 MG capsule Take 40 mg by mouth 2 (two) times daily.    Historical Provider, MD  phenylephrine (,USE  FOR PREPARATION-H,) 0.25 % suppository Place 1 suppository rectally 2 (two) times daily.    Historical Provider, MD  predniSONE (DELTASONE) 20 MG tablet Take 2 tablets (40 mg total) by mouth daily. 12/06/14   Harvest Dark, MD  propranolol (INDERAL) 20 MG tablet Take 20 mg by mouth 3 (three) times daily.    Historical Provider, MD  sucralfate (CARAFATE) 1 G tablet Take 1 g by mouth 4 (four) times daily.    Historical Provider, MD   traZODone (DESYREL) 100 MG tablet Take 100 mg by mouth at bedtime.    Historical Provider, MD  venlafaxine XR (EFFEXOR-XR) 150 MG 24 hr capsule Take 150 mg by mouth daily with breakfast.    Historical Provider, MD    Allergies  Allergen Reactions  . Sulfa Antibiotics Nausea Only    Patient states she gets dehydrated with sulfa as well.    Family History  Problem Relation Age of Onset  . Breast cancer Neg Hx     Social History Social History  Substance Use Topics  . Smoking status: Current Every Day Smoker    Packs/day: 0.50    Types: Cigarettes  . Smokeless tobacco: Never Used  . Alcohol use No    Review of Systems Constitutional: Negative for fever. Cardiovascular: Negative for chest pain. Respiratory: Positive for shortness of breath. Positive for cough with green sputum production. Gastrointestinal: Negative for abdominal pain Neurological: Negative for headache 10-point ROS otherwise negative.  ____________________________________________   PHYSICAL EXAM:  Constitutional: Alert and oriented. Well appearing and in no distress. Eyes: Normal exam ENT   Head: Normocephalic and atraumatic   Mouth/Throat: Mucous membranes are moist. Cardiovascular: Normal rate, regular rhythm. No murmur Respiratory: Mild tachypnea.. Moderate expiratory wheeze bilaterally. Gastrointestinal: Soft and nontender. No distention.   Musculoskeletal: Nontender with normal range of motion in all extremities.  Neurologic:  Normal speech and language. No gross focal neurologic deficits  Skin:  Skin is warm, dry and intact.  Psychiatric: Mood and affect are normal.   ____________________________________________    EKG  The patient presents to the emergency department with sinus tachycardia 111 bpm, narrow QRS, normal axis, normal intervals besides a prolonged QTC of 573 ms, nonspecific ST changes with no ST elevation.  ____________________________________________     RADIOLOGY  Chest x-ray negative  ____________________________________________   INITIAL IMPRESSION / ASSESSMENT AND PLAN / ED COURSE  Pertinent labs & imaging results that were available during my care of the patient were reviewed by me and considered in my medical decision making (see chart for details).  The patient presents to the emergency department with shortness of breath, cough and sputum production. States green sputum production, chills or home but denies any measured temperature.  Patient has an elevated white blood cell count of 18,000, chest x-ray appears clear. Patient has significant expiratory wheeze on exam. We'll treat with DuoNeb's, Zithromax and Solu-Medrol.  Patient continues to have significant wheeze after treatment. Patient's potassium has resulted less than 2.0. It has been repeated and verified less than 2.0. Patient's is being repleted with IV and oral potassium. We will admit to the hospital for COPD exacerbation and hypokalemia.  ____________________________________________   FINAL CLINICAL IMPRESSION(S) / ED DIAGNOSES  COPD exacerbation Bronchitis Hypokalemia   Harvest Dark, MD 11/14/15 [phone]

## 2015-11-14 NOTE — ED Notes (Signed)
Pt transported to xray 

## 2015-11-14 NOTE — ED Notes (Signed)
Pt ambulatory to toilet with no assistance. Appeared somewhat SOB with movement but no c/o of SOB.

## 2015-11-15 LAB — CBC
HEMATOCRIT: 33.4 % — AB (ref 35.0–47.0)
Hemoglobin: 11.6 g/dL — ABNORMAL LOW (ref 12.0–16.0)
MCH: 29.8 pg (ref 26.0–34.0)
MCHC: 34.6 g/dL (ref 32.0–36.0)
MCV: 86.1 fL (ref 80.0–100.0)
PLATELETS: 291 10*3/uL (ref 150–440)
RBC: 3.88 MIL/uL (ref 3.80–5.20)
RDW: 15 % — AB (ref 11.5–14.5)
WBC: 12.3 10*3/uL — AB (ref 3.6–11.0)

## 2015-11-15 LAB — BASIC METABOLIC PANEL
ANION GAP: 8 (ref 5–15)
BUN: 11 mg/dL (ref 6–20)
CALCIUM: 7.7 mg/dL — AB (ref 8.9–10.3)
CO2: 34 mmol/L — ABNORMAL HIGH (ref 22–32)
Chloride: 96 mmol/L — ABNORMAL LOW (ref 101–111)
Creatinine, Ser: 0.92 mg/dL (ref 0.44–1.00)
Glucose, Bld: 204 mg/dL — ABNORMAL HIGH (ref 65–99)
Potassium: 2.4 mmol/L — CL (ref 3.5–5.1)
Sodium: 138 mmol/L (ref 135–145)

## 2015-11-15 LAB — GLUCOSE, CAPILLARY
GLUCOSE-CAPILLARY: 102 mg/dL — AB (ref 65–99)
Glucose-Capillary: 192 mg/dL — ABNORMAL HIGH (ref 65–99)
Glucose-Capillary: 254 mg/dL — ABNORMAL HIGH (ref 65–99)
Glucose-Capillary: 149 mg/dL — ABNORMAL HIGH (ref 65–99)

## 2015-11-15 LAB — POTASSIUM: Potassium: 2.6 mmol/L — CL (ref 3.5–5.1)

## 2015-11-15 LAB — MAGNESIUM: Magnesium: 1.5 mg/dL — ABNORMAL LOW (ref 1.7–2.4)

## 2015-11-15 MED ORDER — TRAZODONE HCL 100 MG PO TABS
200.0000 mg | ORAL_TABLET | Freq: Every day | ORAL | Status: DC
  Administered 2015-11-15: 200 mg via ORAL
  Filled 2015-11-15: qty 2

## 2015-11-15 MED ORDER — NICOTINE 14 MG/24HR TD PT24
14.0000 mg | MEDICATED_PATCH | Freq: Every day | TRANSDERMAL | Status: DC
  Administered 2015-11-15 – 2015-11-16 (×3): 14 mg via TRANSDERMAL
  Filled 2015-11-15 (×3): qty 1

## 2015-11-15 MED ORDER — POTASSIUM CHLORIDE CRYS ER 20 MEQ PO TBCR
40.0000 meq | EXTENDED_RELEASE_TABLET | ORAL | Status: AC
Start: 2015-11-15 — End: 2015-11-15
  Administered 2015-11-15 (×2): 40 meq via ORAL
  Filled 2015-11-15 (×2): qty 2

## 2015-11-15 MED ORDER — MAGNESIUM SULFATE 2 GM/50ML IV SOLN
2.0000 g | Freq: Once | INTRAVENOUS | Status: AC
  Administered 2015-11-15: 2 g via INTRAVENOUS
  Filled 2015-11-15: qty 50

## 2015-11-15 MED ORDER — POTASSIUM CHLORIDE IN NACL 20-0.9 MEQ/L-% IV SOLN
INTRAVENOUS | Status: DC
  Administered 2015-11-15 (×2): via INTRAVENOUS
  Filled 2015-11-15 (×3): qty 1000

## 2015-11-15 MED ORDER — PROCHLORPERAZINE EDISYLATE 5 MG/ML IJ SOLN: 5.0000 mg | INTRAMUSCULAR | Status: DC | PRN

## 2015-11-15 MED ORDER — INSULIN ASPART 100 UNIT/ML ~~LOC~~ SOLN
0.0000 [IU] | Freq: Three times a day (TID) | SUBCUTANEOUS | Status: DC
  Administered 2015-11-15: 5 [IU] via SUBCUTANEOUS
  Administered 2015-11-16: 3 [IU] via SUBCUTANEOUS
  Administered 2015-11-16 (×2): 1 [IU] via SUBCUTANEOUS
  Filled 2015-11-15: qty 1
  Filled 2015-11-15: qty 2
  Filled 2015-11-15: qty 5

## 2015-11-15 MED ORDER — GUAIFENESIN ER 600 MG PO TB12
600.0000 mg | ORAL_TABLET | Freq: Two times a day (BID) | ORAL | Status: DC
  Administered 2015-11-15 – 2015-11-16 (×4): 600 mg via ORAL
  Filled 2015-11-15 (×4): qty 1

## 2015-11-15 MED ORDER — INSULIN ASPART 100 UNIT/ML ~~LOC~~ SOLN: 0.0000 [IU] | Freq: Every day | SUBCUTANEOUS | Status: DC

## 2015-11-15 MED ORDER — BENZONATATE 100 MG PO CAPS
100.0000 mg | ORAL_CAPSULE | Freq: Three times a day (TID) | ORAL | Status: DC
  Administered 2015-11-15 – 2015-11-16 (×6): 100 mg via ORAL
  Filled 2015-11-15 (×6): qty 1

## 2015-11-15 MED ORDER — HYDROCORTISONE ACETATE 25 MG RE SUPP
25.0000 mg | Freq: Two times a day (BID) | RECTAL | Status: DC | PRN
Start: 2015-11-15 — End: 2015-11-17

## 2015-11-15 MED ORDER — MAGNESIUM OXIDE 400 (241.3 MG) MG PO TABS
400.0000 mg | ORAL_TABLET | Freq: Every day | ORAL | Status: DC
  Administered 2015-11-15 – 2015-11-16 (×2): 400 mg via ORAL
  Filled 2015-11-15 (×2): qty 1

## 2015-11-15 NOTE — Progress Notes (Signed)
[facility] [place] NAME: Sheri Barron    MR#:  [id number]  DATE OF BIRTH:  1955/10/15  SUBJECTIVE:  CHIEF COMPLAINT:   Chief Complaint  Patient presents with  . Shortness of Breath  . Cough   - Patient with chronic constipation, has been on laxatives for the last 2 months and intermittent diarrhea. Presented with low potassium and magnesium. -Also having cough with greenish productive sputum.  REVIEW OF SYSTEMS:  Review of Systems  Constitutional: Negative for chills and fever.  HENT: Negative for ear discharge, ear pain and nosebleeds.   Eyes: Negative for blurred vision and double vision.  Respiratory: Positive for cough, sputum production and shortness of breath. Negative for wheezing.   Cardiovascular: Negative for chest pain, palpitations and leg swelling.  Gastrointestinal: Negative for abdominal pain, constipation, diarrhea, nausea and vomiting.  Genitourinary: Negative for dysuria and urgency.  Musculoskeletal: Positive for myalgias.  Neurological: Positive for weakness. Negative for dizziness, speech change, focal weakness, seizures and headaches.  Psychiatric/Behavioral: Negative for depression.    DRUG ALLERGIES:   Allergies  Allergen Reactions  . Sulfa Antibiotics Nausea Only    Patient states she gets dehydrated with sulfa as well.    VITALS:  Blood pressure 118/62, pulse 76, temperature 98.6 F (37 C), resp. rate 16, height 5' (1.524 m), weight 66.7 kg (147 lb 1.6 oz), SpO2 96 %.  PHYSICAL EXAMINATION:  Physical Exam  GENERAL:  60 y.o.-year-old patient lying in the bed with no acute distress.  EYES: Pupils equal, round, reactive to light and accommodation. No scleral icterus. Extraocular muscles intact.  HEENT: Head atraumatic, normocephalic. Oropharynx and nasopharynx clear.  NECK:  Supple, no jugular venous distention. No thyroid enlargement, no tenderness.  LUNGS: Scattered expiratory wheezing  bilaterally, more hoarse breath sounds on the left side, no rales,rhonchi or crepitation. No use of accessory muscles of respiration.  CARDIOVASCULAR: S1, S2 normal. No murmurs, rubs, or gallops.  ABDOMEN: Soft, nontender, nondistended. Bowel sounds present. No organomegaly or mass.  EXTREMITIES: No pedal edema, cyanosis, or clubbing.  NEUROLOGIC: Cranial nerves II through XII are intact. Muscle strength 5/5 in all extremities. Sensation intact. Gait not checked.  PSYCHIATRIC: The patient is alert and oriented x 3.  SKIN: No obvious rash, lesion, or ulcer.    LABORATORY PANEL:   CBC  Recent Labs Lab 11/15/15 0509  WBC 12.3*  HGB 11.6*  HCT 33.4*  PLT 291   ------------------------------------------------------------------------------------------------------------------  Chemistries   Recent Labs Lab 11/15/15 0509  NA 138  K 2.4*  CL 96*  CO2 34*  GLUCOSE 204*  BUN 11  CREATININE 0.92  CALCIUM 7.7*  MG 1.5*   ------------------------------------------------------------------------------------------------------------------  Cardiac Enzymes  Recent Labs Lab 11/14/15 1645  TROPONINI <0.03   ------------------------------------------------------------------------------------------------------------------  RADIOLOGY:  Dg Chest 2 View  Result Date: 11/14/2015 CLINICAL DATA:  Shortness of breath, productive cough for 2 days. History of COPD, diabetes and hypertension. EXAM: CHEST  2 VIEW COMPARISON:  Chest radiograph December 06, 2014 FINDINGS: Cardiomediastinal silhouette is normal. No pleural effusions or focal consolidations. Trachea projects midline and there is no pneumothorax. Soft tissue planes and included osseous structures are non-suspicious. Surgical clips in the included right abdomen compatible with cholecystectomy. Mild degenerative change of thoracic spine. IMPRESSION: Normal chest radiograph. Electronically Signed   By: Elon Alas M.D.   On:  11/14/2015 17:09    EKG:   Orders placed or performed during the hospital encounter of 11/14/15  .  ED EKG  . ED EKG  . EKG 12-Lead  . EKG 12-Lead    ASSESSMENT AND PLAN:   60 year old female with past medical history significant for COPD, GERD, chronic constipation, depression and anxiety and history of CVA presents to hospital secondary to worsening diarrhea and nausea and vomiting and labs showing severe hypokalemia  #1 severe hypokalemia and hypomagnesemia-secondary to diarrhea from laxatives that she has been taking recently. -Diarrhea has subsided. Potassium is improving with supplementation, still very low. Continue potassium supplementation -Magnesium is being supplemented as well. Continue to monitor  #2 COPD exacerbation with bronchitis-added cough medications. -Continue Rocephin and azithromycin -Also on daily Solu-Medrol -Continue nebulizer treatments and her inhalers.  #3 diabetes mellitus-sugars are elevated due to being on steroids. Check A1c. On metformin. Add sliding scale insulin.  #4 tobacco use disorder-counseled against smoking. Started on nicotine patch  #5 depression and anxiety-continue home medications. Patient on trazodone, Effexor and BuSpar  #6 GERD-on Protonix and sucralfate  #7 DVT prophylaxis-on Lovenox   Encourage ambulation this afternoon. Anticipate discharge tomorrow if improving.   All the records are reviewed and case discussed with Care Management/Social Workerr. Management plans discussed with the patient, family and they are in agreement.  CODE STATUS: Full code  TOTAL TIME TAKING CARE OF THIS PATIENT: 38 minutes.   POSSIBLE D/C IN 1-2 DAYS, DEPENDING ON CLINICAL CONDITION.   Gladstone Lighter M.D on 11/15/2015 at 9:30 AM  Between 7am to 6pm - Pager - [phone]  After 6pm go to [email or web address] - password [email or web address] [facility]  Office  [phone]  CC: Primary care physician; [facility]

## 2015-11-16 ENCOUNTER — Encounter: Payer: Self-pay | Admitting: *Deleted

## 2015-11-16 LAB — GLUCOSE, CAPILLARY
GLUCOSE-CAPILLARY: 122 mg/dL — AB (ref 65–99)
GLUCOSE-CAPILLARY: 212 mg/dL — AB (ref 65–99)
GLUCOSE-CAPILLARY: 137 mg/dL — AB (ref 65–99)
Glucose-Capillary: 127 mg/dL — ABNORMAL HIGH (ref 65–99)

## 2015-11-16 LAB — MAGNESIUM: Magnesium: 1.9 mg/dL (ref 1.7–2.4)

## 2015-11-16 LAB — BASIC METABOLIC PANEL
ANION GAP: 6 (ref 5–15)
BUN: 11 mg/dL (ref 6–20)
CHLORIDE: 104 mmol/L (ref 101–111)
CO2: 31 mmol/L (ref 22–32)
Calcium: 7.4 mg/dL — ABNORMAL LOW (ref 8.9–10.3)
Creatinine, Ser: 0.72 mg/dL (ref 0.44–1.00)
GFR calc Af Amer: >60 mL/min (ref >60)
GFR calc non Af Amer: >60 mL/min (ref >60)
GLUCOSE: 140 mg/dL — AB (ref 65–99)
POTASSIUM: 2.7 mmol/L — AB (ref 3.5–5.1)
Sodium: 141 mmol/L (ref 135–145)

## 2015-11-16 LAB — POTASSIUM: Potassium: 3.4 mmol/L — ABNORMAL LOW (ref 3.5–5.1)

## 2015-11-16 MED ORDER — POTASSIUM CHLORIDE CRYS ER 20 MEQ PO TBCR
40.0000 meq | EXTENDED_RELEASE_TABLET | Freq: Once | ORAL | Status: AC
  Administered 2015-11-16: 40 meq via ORAL
  Filled 2015-11-16: qty 2

## 2015-11-16 MED ORDER — ONDANSETRON HCL 4 MG/2ML IJ SOLN
4.0000 mg | Freq: Four times a day (QID) | INTRAMUSCULAR | Status: DC
  Administered 2015-11-16 (×2): 4 mg via INTRAVENOUS
  Filled 2015-11-16 (×2): qty 2

## 2015-11-16 MED ORDER — PANTOPRAZOLE SODIUM 40 MG PO TBEC
40.0000 mg | DELAYED_RELEASE_TABLET | Freq: Two times a day (BID) | ORAL | Status: DC
  Administered 2015-11-16 (×2): 40 mg via ORAL
  Filled 2015-11-16 (×2): qty 1

## 2015-11-16 MED ORDER — HYDROCOD POLST-CPM POLST ER 10-8 MG/5ML PO SUER
5.0000 mL | Freq: Two times a day (BID) | ORAL | Status: DC
  Administered 2015-11-16 (×2): 5 mL via ORAL
  Filled 2015-11-16 (×2): qty 5

## 2015-11-16 MED ORDER — SODIUM CHLORIDE 0.9 % IV SOLN
Freq: Once | INTRAVENOUS | Status: AC
  Administered 2015-11-16: 09:00:00 via INTRAVENOUS
  Filled 2015-11-16: qty 1000

## 2015-11-16 MED ORDER — ENSURE ENLIVE PO LIQD
237.0000 mL | Freq: Two times a day (BID) | ORAL | Status: DC
  Administered 2015-11-16 (×2): 237 mL via ORAL

## 2015-11-16 NOTE — Progress Notes (Signed)
Initial Nutrition Assessment  DOCUMENTATION CODES:   Not applicable  INTERVENTION:  1. Ensure Enlive po BID, each supplement provides 350 kcal and 20 grams of protein 2. Provided counseling, education on sodium management for patient. Encouraged low sodium, non-processed foods and to increase intake of fresh foods/fresh fruits and vegetables.  NUTRITION DIAGNOSIS:   Inadequate oral intake related to poor appetite as evidenced by per patient/family report  GOAL:   Patient will meet greater than or equal to 90% of their needs  MONITOR:   PO intake, I & O's, Supplement acceptance, Labs, Weight trends  REASON FOR ASSESSMENT:   Malnutrition Screening Tool    ASSESSMENT:   Sheri Barron  is a 60 y.o. female with a known history of  Essential hypertension, benign essential tremors came in because of shortness of breath, cough, vomiting, epigastric pain  Spoke with Sheri Barron at bedside.  She endorses poor appetite at home, eating 1x/day, maybe 2x, but states "it is difficult to eat when you don't get hungry." Drinks ensure and gatorade at home as well. She admits to a normal weight of 167# prior to onset of her illness. She describes not having her creon for 1 week and having diarrhea for 2 months because multiple physicians wanted to "flush her out." She was 147# upon admission, up to 152# today due to fluid accumulation. Admits to nausea/vomiting when she has sputum accumulation, but otherwise she is ok. Denies any pain or issues related to gastritis/GERD at this time. For breakfast this morning she had 1/2 an omelet, homefries, and pineapple. Labs and medications reviewed: CBGs 102-212 Metformin, MagOx, Solumedrol, KCL  Diet Order:  Diet regular Room service appropriate? Yes; Fluid consistency: Thin  Skin:  Reviewed, no issues  Last BM:  11/15/2015  Height:   Ht Readings from Last 1 Encounters:  11/14/15 5' (1.524 m)    Weight:   Wt Readings from Last 1 Encounters:   11/16/15 152 lb 14.4 oz (69.4 kg)    Ideal Body Weight:  45.45 kg  BMI:  Body mass index is 29.86 kg/m.  Estimated Nutritional Needs:   Kcal:  1700-2000 calories  Protein:  70-83 gm  Fluid:  >/= 1.7L  EDUCATION NEEDS:   No education needs identified at this time  Satira Anis. Raeann Offner, MS, RD LDN Inpatient Clinical Dietitian Pager [phone]

## 2015-11-16 NOTE — Progress Notes (Signed)
Pt very upset & tearful, pt states that she is having very bad nicotine withdrawals. Pt currently has a nicotine patch on and she states that it is not working. Pt has smoked her whole life and has not had a cigarette since Friday. Pt states she either needs to go smoke a cigarette or she needs to go home. I tried to offer patient the cartridges that are a form of nicotine replacement, pt does not want that I also offered something for her nerves, pt does not want that either. I educated pt on that she cannot go out and smoke and that if she wanted to go home since she could not smoke, then she would have to sign out AMA, pt agreed to sign out AMA. MD called, Dr. Posey Pronto is agreeable to her signing out [place] as well. Will get paperwork together will pt calls a ride to come pick her up. Will continue to monitor. Conley Simmonds, RN, BSN

## 2015-11-16 NOTE — Progress Notes (Signed)
Pt signed [facility] paperwork, and is ready to leave. Home medications have been retrieved from the pharmacy and returned to pt. Gave pt her night time medications.  Vitals:   11/16/15 1126 11/16/15 2003  BP: 119/88 131/86  Pulse: 82 65  Resp: 18 18  Temp: 97.8 F (36.6 C) 98.2 F (36.8 C)    Skin clean, dry and intact without evidence of skin break down, no evidence of skin tears noted. IV catheter discontinued intact. Site without signs and symptoms of complications. Dressing and pressure applied. Telemetry removed. Pt denies pain at this time. No complaints noted. Pt's daughter picked her up via private vehicle.   Velna Ochs 11/16/2015 10:02 PM

## 2015-11-16 NOTE — Progress Notes (Signed)
[name] [place] NAME: Sheri Barron    MR#:  [id number]  DATE OF BIRTH:  1955-11-19  SUBJECTIVE:  CHIEF COMPLAINT:   Chief Complaint  Patient presents with  . Shortness of Breath  . Cough   - cough remains the same, complains of significant nausea today - potassium still at 2.7  REVIEW OF SYSTEMS:  Review of Systems  Constitutional: Negative for chills and fever.  HENT: Negative for ear discharge, ear pain and nosebleeds.   Eyes: Negative for blurred vision and double vision.  Respiratory: Positive for cough and sputum production. Negative for shortness of breath and wheezing.   Cardiovascular: Negative for chest pain, palpitations and leg swelling.  Gastrointestinal: Positive for nausea. Negative for abdominal pain, constipation, diarrhea and vomiting.  Genitourinary: Negative for dysuria and urgency.  Musculoskeletal: Positive for myalgias.  Neurological: Positive for weakness. Negative for dizziness, speech change, focal weakness, seizures and headaches.  Psychiatric/Behavioral: Negative for depression.    DRUG ALLERGIES:   Allergies  Allergen Reactions  . Sulfa Antibiotics Nausea Only    Patient states she gets dehydrated with sulfa as well.    VITALS:  Blood pressure 121/70, pulse 64, temperature 97.6 F (36.4 C), temperature source Oral, resp. rate 20, height 5' (1.524 m), weight 69.4 kg (152 lb 14.4 oz), SpO2 100 %.  PHYSICAL EXAMINATION:  Physical Exam  GENERAL:  60 y.o.-year-old patient lying in the bed with no acute distress.  EYES: Pupils equal, round, reactive to light and accommodation. No scleral icterus. Extraocular muscles intact.  HEENT: Head atraumatic, normocephalic. Oropharynx and nasopharynx clear.  NECK:  Supple, no jugular venous distention. No thyroid enlargement, no tenderness.  LUNGS: Improved expiratory wheezing bilaterally, more hoarse breath sounds on the left side, no rales,rhonchi or  crepitation. No use of accessory muscles of respiration.  CARDIOVASCULAR: S1, S2 normal. No murmurs, rubs, or gallops.  ABDOMEN: Soft, nontender, nondistended. Bowel sounds present. No organomegaly or mass.  EXTREMITIES: No pedal edema, cyanosis, or clubbing.  NEUROLOGIC: Cranial nerves II through XII are intact. Muscle strength 5/5 in all extremities. Sensation intact. Gait not checked.  PSYCHIATRIC: The patient is alert and oriented x 3.  SKIN: No obvious rash, lesion, or ulcer.    LABORATORY PANEL:   CBC  Recent Labs Lab 11/15/15 0509  WBC 12.3*  HGB 11.6*  HCT 33.4*  PLT 291   ------------------------------------------------------------------------------------------------------------------  Chemistries   Recent Labs Lab 11/16/15 0538  NA 141  K 2.7*  CL 104  CO2 31  GLUCOSE 140*  BUN 11  CREATININE 0.72  CALCIUM 7.4*  MG 1.9   ------------------------------------------------------------------------------------------------------------------  Cardiac Enzymes  Recent Labs Lab 11/14/15 1645  TROPONINI <0.03   ------------------------------------------------------------------------------------------------------------------  RADIOLOGY:  Dg Chest 2 View  Result Date: 11/14/2015 CLINICAL DATA:  Shortness of breath, productive cough for 2 days. History of COPD, diabetes and hypertension. EXAM: CHEST  2 VIEW COMPARISON:  Chest radiograph December 06, 2014 FINDINGS: Cardiomediastinal silhouette is normal. No pleural effusions or focal consolidations. Trachea projects midline and there is no pneumothorax. Soft tissue planes and included osseous structures are non-suspicious. Surgical clips in the included right abdomen compatible with cholecystectomy. Mild degenerative change of thoracic spine. IMPRESSION: Normal chest radiograph. Electronically Signed   By: Elon Alas M.D.   On: 11/14/2015 17:09    EKG:   Orders placed or performed during the hospital  encounter of 11/14/15  . ED EKG  . ED EKG  . EKG  12-Lead  . EKG 12-Lead    ASSESSMENT AND PLAN:   60 year old female with past medical history significant for COPD, GERD, chronic constipation, depression and anxiety and history of CVA presents to hospital secondary to worsening diarrhea and nausea and vomiting and labs showing severe hypokalemia  #1 severe hypokalemia and hypomagnesemia-secondary to diarrhea from laxatives that she has been taking recently. -Diarrhea has subsided. Potassium is improving with supplementation, still very low. Continue potassium supplementation -Magnesium is being supplemented as well. Continue to monitor  #2 COPD exacerbation with bronchitis-added cough medications. -Continue Rocephin and azithromycin -Also on daily Solu-Medrol -Continue nebulizer treatments and her inhalers. Flutter valve, cough meds  #3 diabetes mellitus-sugars are elevated due to being on steroids.  On metformin. Added sliding scale insulin.  #4 tobacco use disorder-counseled against smoking. Started on nicotine patch  #5 depression and anxiety-continue home medications. Patient on trazodone, Effexor and BuSpar  #6 GERD-on Protonix bid and sucralfate  #7 DVT prophylaxis-on Lovenox    Anticipate discharge tomorrow if improving.   All the records are reviewed and case discussed with Care Management/Social Workerr. Management plans discussed with the patient, family and they are in agreement.  CODE STATUS: Full code  TOTAL TIME TAKING CARE OF THIS PATIENT: 37 minutes.   POSSIBLE D/C TOMORROW, DEPENDING ON CLINICAL CONDITION.   Gladstone Lighter M.D on 11/16/2015 at 8:55 AM  Between 7am to 6pm - Pager - [phone]  After 6pm go to [email or web address] - password [email or web address] [facility]  Office  [phone]  CC: Primary care physician; [facility]

## 2015-11-17 LAB — HEMOGLOBIN A1C
Hgb A1c MFr Bld: 6.1 % — ABNORMAL HIGH (ref 4.8–5.6)
Mean Plasma Glucose: 128 mg/dL

## 2015-11-17 NOTE — Discharge Summary (Signed)
[place] [facility] [place] NAME: Sheri Barron    MR#:  [id number]  DATE OF BIRTH:  1955-07-09  DATE OF ADMISSION:  11/14/2015   ADMITTING PHYSICIAN: Epifanio Lesches, MD  DATE OF DISCHARGE: 11/16/2015 10:06 PM PATIENT HAS LEFT AMA  PRIMARY CARE PHYSICIAN: [place]   ADMISSION DIAGNOSIS:   Hypokalemia [E87.6] COPD exacerbation (HCC) [J44.1]  DISCHARGE DIAGNOSIS:   Active Problems:   Hypokalemia   SECONDARY DIAGNOSIS:   Past Medical History:  Diagnosis Date  . Anxiety   . Chronic constipation   . COPD (chronic obstructive pulmonary disease) ([place])   . Depression   . GERD (gastroesophageal reflux disease)   . Stroke [facility])     HOSPITAL COURSE:   60 year old female with past medical history significant for COPD, GERD, chronic constipation, depression and anxiety and history of CVA presents to hospital secondary to worsening diarrhea and nausea and vomiting and labs showing severe hypokalemia  #1 severe hypokalemia and hypomagnesemia-secondary to diarrhea from laxatives that she has been taking recently. -Diarrhea has subsided. Potassium is improving with supplementation.  Continue potassium supplementation -Magnesium is being supplemented as well.  #2 COPD exacerbation with bronchitis-added cough medications. -received Rocephin and azithromycin -Also on daily Solu-Medrol -Continue nebulizer treatments and her inhalers. Flutter valve, cough meds  #3 diabetes mellitus-sugars are elevated due to being on steroids.  On metformin. Added sliding scale insulin.  #4 tobacco use disorder-counseled against smoking. Started on nicotine patch  #5 depression and anxiety-continue home medications. Patient on trazodone, Effexor and BuSpar  #6 GERD-on Protonix bid and sucralfate  Patient not very anxious on the evening of 11/16/2015, wanted to smoke cigarettes. Refused anxiety medications, refused nicotine  supplements. She has left AGAINST MEDICAL ADVICE  DISCHARGE CONDITIONS:   Stable  CONSULTS OBTAINED:   None  DRUG ALLERGIES:   Allergies  Allergen Reactions  . Sulfa Antibiotics Nausea Only    Patient states she gets dehydrated with sulfa as well.   DISCHARGE MEDICATIONS:     Medication List    ASK your doctor about these medications   albuterol 108 (90 Base) MCG/ACT inhaler Commonly known as:  PROVENTIL HFA;VENTOLIN HFA Inhale 2 puffs into the lungs every 6 (six) hours as needed for wheezing or shortness of breath.   aspirin 325 MG tablet Take 325 mg by mouth daily.   azelastine 0.1 % nasal spray Commonly known as:  ASTELIN Place 2 sprays into both nostrils 2 (two) times daily. Use in each nostril as directed   beclomethasone 80 MCG/ACT inhaler Commonly known as:  QVAR Inhale 2 puffs into the lungs 2 (two) times daily.   fluticasone 50 MCG/ACT nasal spray Commonly known as:  FLONASE Place 1 spray into both nostrils 2 (two) times daily.   gabapentin 100 MG capsule Commonly known as:  NEURONTIN Take 100 mg by mouth 3 (three) times daily as needed. For pain   hydrochlorothiazide 12.5 MG capsule Commonly known as:  MICROZIDE Take 12.5 mg by mouth daily.   ibuprofen 200 MG tablet Commonly known as:  ADVIL,MOTRIN Take 800 mg by mouth 3 (three) times daily as needed for moderate pain.   lidocaine 5 % Commonly known as:  LIDODERM Place 1 patch onto the skin daily as needed. For pain   linaclotide 145 MCG Caps capsule Commonly known as:  LINZESS Take 145 mcg by mouth daily before breakfast.   loratadine 10 MG tablet Commonly known as:  CLARITIN Take 10 mg  by mouth daily.   metFORMIN 500 MG tablet Commonly known as:  GLUCOPHAGE Take 500 mg by mouth 2 (two) times daily with a meal.   omeprazole 20 MG capsule Commonly known as:  PRILOSEC Take 20 mg by mouth daily.   polyethylene glycol packet Commonly known as:  MIRALAX / GLYCOLAX Take 17 g by mouth at  bedtime.   propranolol 20 MG tablet Commonly known as:  INDERAL Take 40 mg by mouth 2 (two) times daily.   senna 8.6 MG Tabs tablet Commonly known as:  SENOKOT Take 1 tablet by mouth daily.   sucralfate 1 g tablet Commonly known as:  CARAFATE Take 1 g by mouth 4 (four) times daily -  before meals and at bedtime.   tiotropium 18 MCG inhalation capsule Commonly known as:  SPIRIVA Place 18 mcg into inhaler and inhale daily.   traZODone 100 MG tablet Commonly known as:  DESYREL Take 300 mg by mouth at bedtime.   ursodiol 300 MG capsule Commonly known as:  ACTIGALL Take 300 mg by mouth 2 (two) times daily.   venlafaxine XR 150 MG 24 hr capsule Commonly known as:  EFFEXOR-XR Take 150 mg by mouth daily with breakfast.         DISCHARGE LOCATION:   home   If you experience worsening of your admission symptoms, develop shortness of breath, life threatening emergency, suicidal or homicidal thoughts you must seek medical attention immediately by calling 911 or calling your MD immediately  if symptoms less severe.  You Must read complete instructions/literature along with all the possible adverse reactions/side effects for all the Medicines you take and that have been prescribed to you. Take any new Medicines after you have completely understood and accpet all the possible adverse reactions/side effects.   Please note  You were cared for by a hospitalist during your hospital stay. If you have any questions about your discharge medications or the care you received while you were in the hospital after you are discharged, you can call the unit and asked to speak with the hospitalist on call if the hospitalist that took care of you is not available. Once you are discharged, your primary care physician will handle any further medical issues. Please note that NO REFILLS for any discharge medications will be authorized once you are discharged, as it is imperative that you return to your  primary care physician (or establish a relationship with a primary care physician if you do not have one) for your aftercare needs so that they can reassess your need for medications and monitor your lab values.    On the day of Discharge:  VITAL SIGNS:   Blood pressure 131/86, pulse 65, temperature 98.2 F (36.8 C), temperature source Oral, resp. rate 18, height 5' (1.524 m), weight 69.4 kg (152 lb 14.4 oz), SpO2 98 %.  PHYSICAL EXAMINATION:    GENERAL:  60 y.o.-year-old patient lying in the bed with no acute distress.  EYES: Pupils equal, round, reactive to light and accommodation. No scleral icterus. Extraocular muscles intact.  HEENT: Head atraumatic, normocephalic. Oropharynx and nasopharynx clear.  NECK:  Supple, no jugular venous distention. No thyroid enlargement, no tenderness.  LUNGS: Improved expiratory wheezing bilaterally, more hoarse breath sounds on the left side, no rales,rhonchi or crepitation. No use of accessory muscles of respiration.  CARDIOVASCULAR: S1, S2 normal. No murmurs, rubs, or gallops.  ABDOMEN: Soft, nontender, nondistended. Bowel sounds present. No organomegaly or mass.  EXTREMITIES: No pedal edema, cyanosis, or  clubbing.  NEUROLOGIC: Cranial nerves II through XII are intact. Muscle strength 5/5 in all extremities. Sensation intact. Gait not checked.  PSYCHIATRIC: The patient is alert and oriented x 3.  SKIN: No obvious rash, lesion, or ulcer.    DATA REVIEW:   CBC  Recent Labs Lab 11/15/15 0509  WBC 12.3*  HGB 11.6*  HCT 33.4*  PLT 291    Chemistries   Recent Labs Lab 11/16/15 0538 11/16/15 1248  NA 141  --   K 2.7* 3.4*  CL 104  --   CO2 31  --   GLUCOSE 140*  --   BUN 11  --   CREATININE 0.72  --   CALCIUM 7.4*  --   MG 1.9  --      Microbiology Results  Results for orders placed or performed in visit on 10/19/13  Culture, expectorated sputum-assessment     Status: None   Collection Time: 10/20/13  6:22 PM  Result Value  Ref Range Status   Micro Text Report   Final       COMMENT                   APPEARS TO BE NORMAL FLORA AT 48 HOURS   GRAM STAIN                GOOD SPECIMEN-80-90% WBC   GRAM STAIN                MANY WHITE BLOOD CELLS   GRAM STAIN                FEW GRAM POSITIVE COCCI IN PAIRS AND CHAINS   GRAM STAIN                MODERATE GRAM NEG COCCOBACILLI, FEW GRAM POS RODS   ANTIBIOTIC                                                      Culture, expectorated sputum-assessment     Status: None   Collection Time: 10/21/13 10:08 PM  Result Value Ref Range Status   Micro Text Report   Final       COMMENT                   APPEARS TO BE NORMAL FLORA AT 48 HOURS   GRAM STAIN                EXCELLENT SPECIMEN-90-100% WBC   GRAM STAIN                MANY WHITE BLOOD CELLS   GRAM STAIN                RARE GRAM POSITIVE COCCI, RARE GRAM POSITIVE ROD,   GRAM STAIN                RARE GRAM VARIABLE ROD   ANTIBIOTIC                                                        RADIOLOGY:  No results found.   Management plans discussed with the patient, family and they  are in agreement.  CODE STATUS:  Code Status History    Date Active Date Inactive Code Status Order ID Comments User Context   11/14/2015  7:01 PM 11/17/2015  1:12 AM Full Code [id number]  Epifanio Lesches, MD ED      TOTAL TIME TAKING CARE OF THIS PATIENT: 37 minutes.    Gladstone Lighter M.D on 11/17/2015 at 2:02 PM  Between 7am to 6pm - Pager - [phone]  After 6pm go to [email or web address] - Technical brewer [facility]  Office  [phone]  CC: Primary care physician; [facility]   Note: This dictation was prepared with Dragon dictation along with smaller phrase technology. Any transcriptional errors that result from this process are unintentional.

## 2016-02-02 ENCOUNTER — Other Ambulatory Visit: Payer: Self-pay | Admitting: Nurse Practitioner

## 2016-02-02 DIAGNOSIS — R1011 Right upper quadrant pain: Secondary | ICD-10-CM

## 2016-02-02 DIAGNOSIS — G8929 Other chronic pain: Secondary | ICD-10-CM

## 2016-02-02 DIAGNOSIS — R109 Unspecified abdominal pain: Secondary | ICD-10-CM

## 2016-02-02 DIAGNOSIS — K760 Fatty (change of) liver, not elsewhere classified: Secondary | ICD-10-CM

## 2016-02-09 ENCOUNTER — Ambulatory Visit
Admission: RE | Admit: 2016-02-09 | Discharge: 2016-02-09 | Disposition: A | Discharge status: Home / Self Care | Payer: Medicare Other | Source: Ambulatory Visit | Attending: Nurse Practitioner | Admitting: Nurse Practitioner

## 2016-02-09 ENCOUNTER — Ambulatory Visit: Admission: RE | Admit: 2016-02-09 | Payer: Medicare Other | Source: Ambulatory Visit

## 2016-02-13 ENCOUNTER — Ambulatory Visit
Admission: RE | Admit: 2016-02-13 | Discharge: 2016-02-13 | Disposition: A | Discharge status: Home / Self Care | Payer: Medicare Other | Source: Ambulatory Visit | Attending: Nurse Practitioner | Admitting: Nurse Practitioner

## 2016-02-13 DIAGNOSIS — K760 Fatty (change of) liver, not elsewhere classified: Secondary | ICD-10-CM | POA: Diagnosis not present

## 2016-02-13 DIAGNOSIS — G8929 Other chronic pain: Secondary | ICD-10-CM | POA: Insufficient documentation

## 2016-02-13 DIAGNOSIS — Z9049 Acquired absence of other specified parts of digestive tract: Secondary | ICD-10-CM | POA: Diagnosis not present

## 2016-02-13 DIAGNOSIS — R1011 Right upper quadrant pain: Secondary | ICD-10-CM | POA: Insufficient documentation

## 2016-02-13 DIAGNOSIS — R109 Unspecified abdominal pain: Secondary | ICD-10-CM

## 2016-03-15 ENCOUNTER — Other Ambulatory Visit: Payer: Self-pay | Admitting: Specialist

## 2016-03-15 DIAGNOSIS — R918 Other nonspecific abnormal finding of lung field: Secondary | ICD-10-CM

## 2016-03-22 ENCOUNTER — Ambulatory Visit: Admission: RE | Admit: 2016-03-22 | Payer: Medicare Other | Source: Ambulatory Visit

## 2016-04-05 ENCOUNTER — Ambulatory Visit
Admission: RE | Admit: 2016-04-05 | Discharge: 2016-04-05 | Disposition: A | Discharge status: Home / Self Care | Payer: Medicare Other | Source: Ambulatory Visit | Attending: Specialist | Admitting: Specialist

## 2016-04-05 DIAGNOSIS — K760 Fatty (change of) liver, not elsewhere classified: Secondary | ICD-10-CM | POA: Diagnosis not present

## 2016-04-05 DIAGNOSIS — J432 Centrilobular emphysema: Secondary | ICD-10-CM | POA: Insufficient documentation

## 2016-04-05 DIAGNOSIS — R918 Other nonspecific abnormal finding of lung field: Secondary | ICD-10-CM | POA: Diagnosis not present

## 2016-04-08 ENCOUNTER — Other Ambulatory Visit: Payer: Self-pay | Admitting: Nurse Practitioner

## 2016-04-08 DIAGNOSIS — R1032 Left lower quadrant pain: Secondary | ICD-10-CM

## 2016-04-13 ENCOUNTER — Ambulatory Visit
Admission: RE | Admit: 2016-04-13 | Discharge: 2016-04-13 | Disposition: A | Discharge status: Home / Self Care | Payer: Medicare Other | Source: Ambulatory Visit | Attending: Nurse Practitioner | Admitting: Nurse Practitioner

## 2016-04-13 DIAGNOSIS — K429 Umbilical hernia without obstruction or gangrene: Secondary | ICD-10-CM | POA: Diagnosis not present

## 2016-04-13 DIAGNOSIS — K760 Fatty (change of) liver, not elsewhere classified: Secondary | ICD-10-CM | POA: Insufficient documentation

## 2016-04-13 DIAGNOSIS — I700 Atherosclerosis of aorta: Secondary | ICD-10-CM | POA: Insufficient documentation

## 2016-04-13 DIAGNOSIS — R1032 Left lower quadrant pain: Secondary | ICD-10-CM | POA: Diagnosis not present

## 2016-04-13 HISTORY — DX: Essential (primary) hypertension: I10

## 2016-04-13 HISTORY — DX: Unspecified asthma, uncomplicated: J45.909

## 2016-04-13 HISTORY — DX: Type 2 diabetes mellitus without complications: E11.9

## 2016-04-13 MED ORDER — IOPAMIDOL (ISOVUE-300) INJECTION 61%
100.0000 mL | Freq: Once | INTRAVENOUS | Status: AC | PRN
  Administered 2016-04-13: 100 mL via INTRAVENOUS

## 2016-05-17 DIAGNOSIS — K589 Irritable bowel syndrome without diarrhea: Secondary | ICD-10-CM | POA: Diagnosis present

## 2016-12-31 IMAGING — CR DG CHEST 2V
1 series · 2 of 2 positions shown · non-contrast
Comparison: Comparison made to prior study 10/18/2013 .

CLINICAL DATA: Cough.

EXAM:
CHEST  2 VIEW

[Series 1: dg chest 2 view · 0.14mm/px · 2 of 2 slices shown]
[im 1/2]
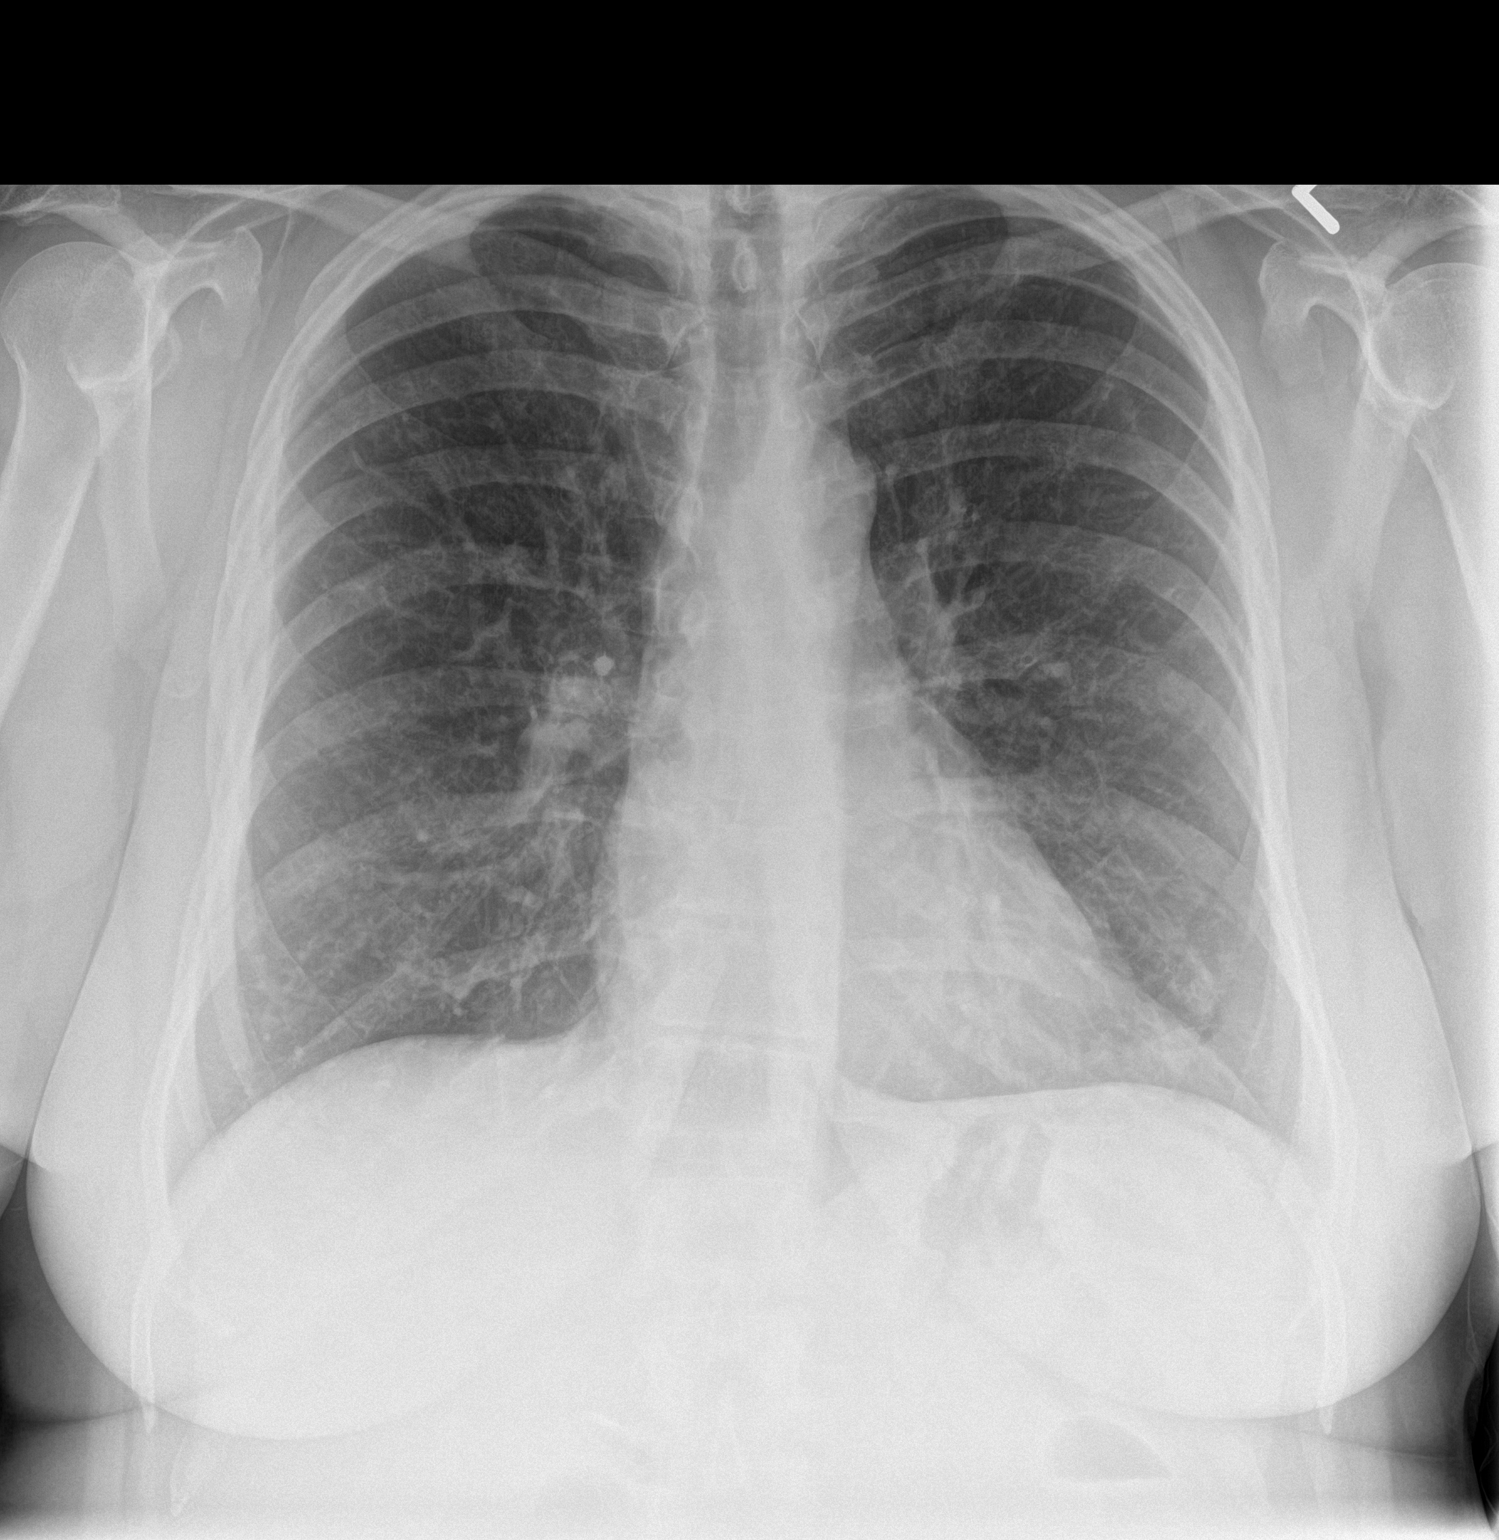
[im 2/2]
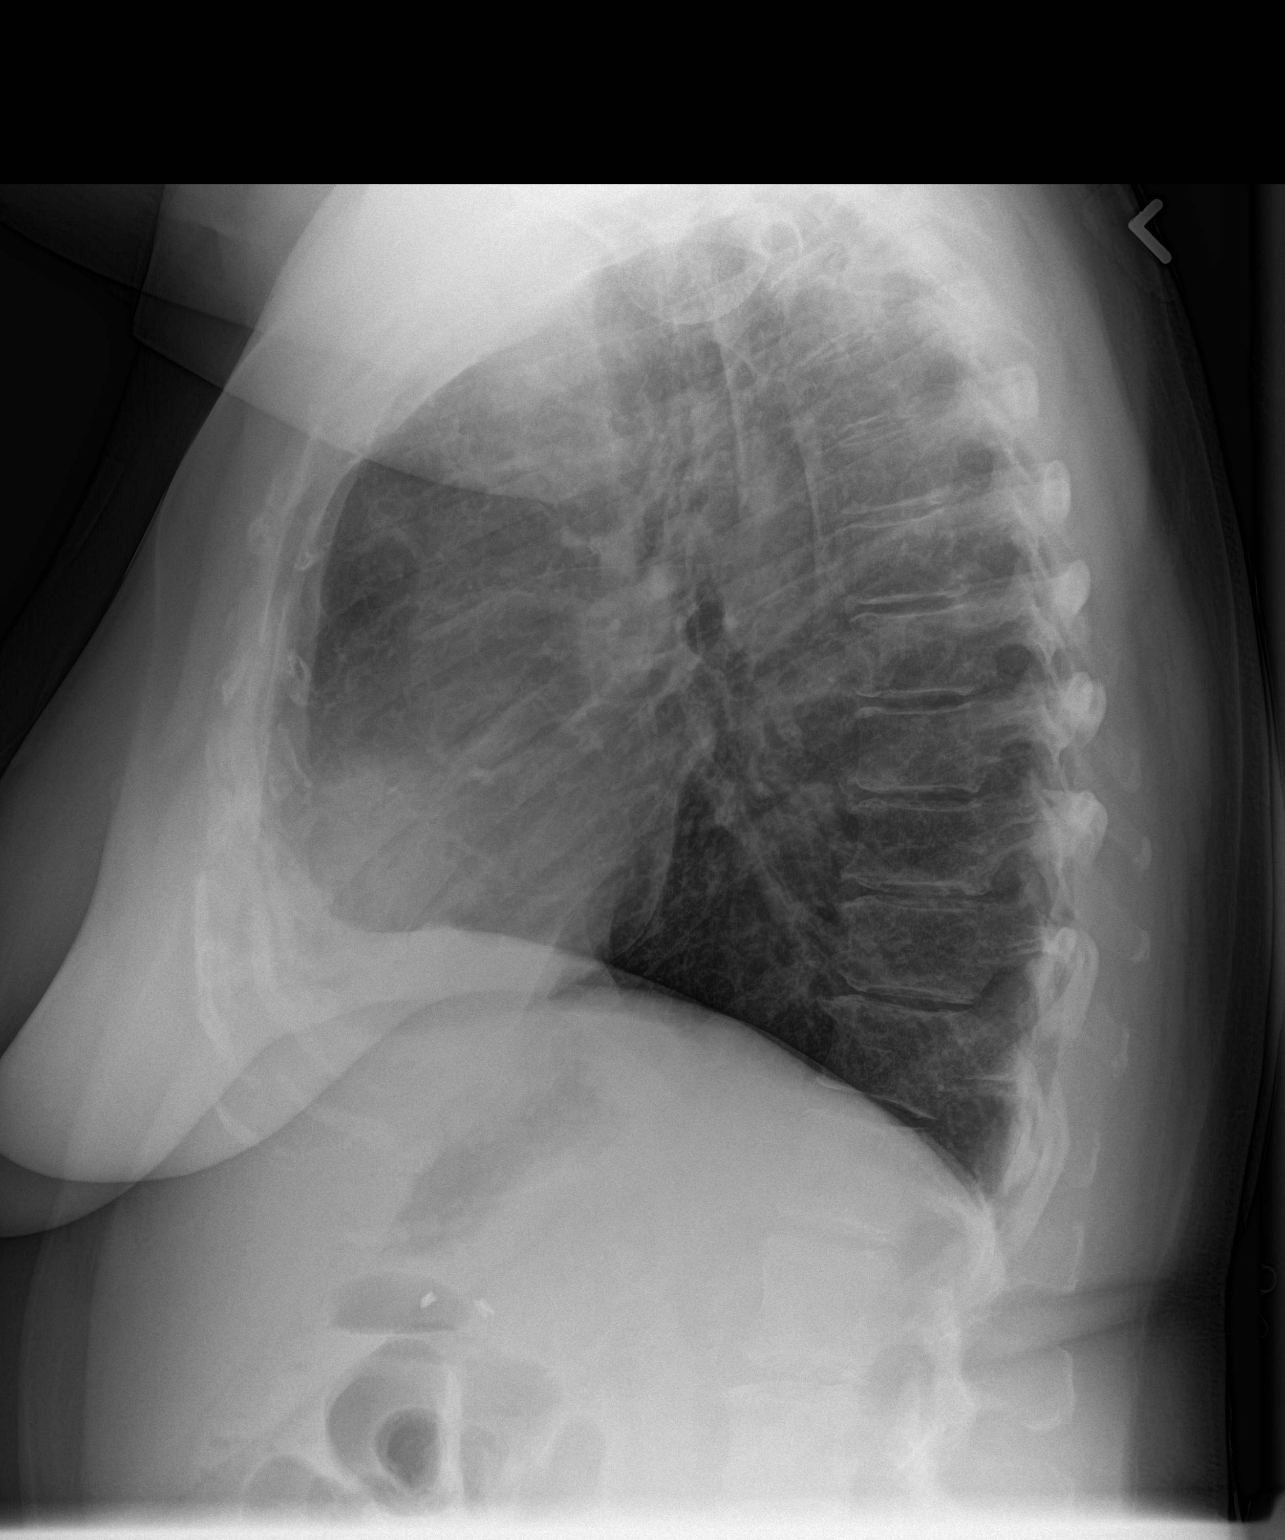

[2 of 2 positions shown; findings below may reference images not displayed]

FINDINGS: Mediastinum and hilar structures normal. Lungs are clear. No pleural
effusion or pneumothorax. Heart size normal. No acute bony
abnormality.
IMPRESSION: No acute cardiopulmonary disease.

## 2017-06-21 ENCOUNTER — Other Ambulatory Visit: Payer: Self-pay | Admitting: Family Medicine

## 2017-06-21 DIAGNOSIS — Z1231 Encounter for screening mammogram for malignant neoplasm of breast: Secondary | ICD-10-CM

## 2017-10-21 ENCOUNTER — Encounter: Payer: Self-pay | Admitting: Anesthesiology

## 2017-10-21 ENCOUNTER — Ambulatory Visit
Admission: RE | Admit: 2017-10-21 | Discharge: 2017-10-21 | Disposition: A | Discharge status: Home / Self Care | Payer: Medicare HMO | Source: Ambulatory Visit | Attending: Unknown Physician Specialty | Admitting: Unknown Physician Specialty

## 2017-10-21 ENCOUNTER — Encounter
Admission: RE | Disposition: A | Discharge status: Home / Self Care | Payer: Self-pay | Source: Ambulatory Visit | Attending: Unknown Physician Specialty

## 2017-10-21 ENCOUNTER — Ambulatory Visit: Payer: Medicare HMO | Admitting: Anesthesiology

## 2017-10-21 DIAGNOSIS — E119 Type 2 diabetes mellitus without complications: Secondary | ICD-10-CM | POA: Insufficient documentation

## 2017-10-21 DIAGNOSIS — J449 Chronic obstructive pulmonary disease, unspecified: Secondary | ICD-10-CM | POA: Diagnosis not present

## 2017-10-21 DIAGNOSIS — K5909 Other constipation: Secondary | ICD-10-CM | POA: Diagnosis not present

## 2017-10-21 DIAGNOSIS — Z7951 Long term (current) use of inhaled steroids: Secondary | ICD-10-CM | POA: Diagnosis not present

## 2017-10-21 DIAGNOSIS — I10 Essential (primary) hypertension: Secondary | ICD-10-CM | POA: Diagnosis not present

## 2017-10-21 DIAGNOSIS — Z7982 Long term (current) use of aspirin: Secondary | ICD-10-CM | POA: Insufficient documentation

## 2017-10-21 DIAGNOSIS — D128 Benign neoplasm of rectum: Secondary | ICD-10-CM | POA: Diagnosis not present

## 2017-10-21 DIAGNOSIS — Z8673 Personal history of transient ischemic attack (TIA), and cerebral infarction without residual deficits: Secondary | ICD-10-CM | POA: Diagnosis not present

## 2017-10-21 DIAGNOSIS — Z7984 Long term (current) use of oral hypoglycemic drugs: Secondary | ICD-10-CM | POA: Diagnosis not present

## 2017-10-21 DIAGNOSIS — K5730 Diverticulosis of large intestine without perforation or abscess without bleeding: Secondary | ICD-10-CM | POA: Diagnosis not present

## 2017-10-21 DIAGNOSIS — F17210 Nicotine dependence, cigarettes, uncomplicated: Secondary | ICD-10-CM | POA: Diagnosis not present

## 2017-10-21 DIAGNOSIS — K219 Gastro-esophageal reflux disease without esophagitis: Secondary | ICD-10-CM | POA: Insufficient documentation

## 2017-10-21 DIAGNOSIS — F329 Major depressive disorder, single episode, unspecified: Secondary | ICD-10-CM | POA: Insufficient documentation

## 2017-10-21 DIAGNOSIS — Z79899 Other long term (current) drug therapy: Secondary | ICD-10-CM | POA: Insufficient documentation

## 2017-10-21 HISTORY — PX: COLONOSCOPY WITH PROPOFOL: SHX5780

## 2017-10-21 LAB — GLUCOSE, CAPILLARY: Glucose-Capillary: 103 mg/dL — ABNORMAL HIGH (ref 70–99)

## 2017-10-21 SURGERY — COLONOSCOPY WITH PROPOFOL
Anesthesia: General

## 2017-10-21 MED ORDER — MIDAZOLAM HCL 5 MG/ML IJ SOLN
INTRAMUSCULAR | Status: DC | PRN
  Administered 2017-10-21: 1 mg via INTRAVENOUS

## 2017-10-21 MED ORDER — PROPOFOL 500 MG/50ML IV EMUL
INTRAVENOUS | Status: DC | PRN
  Administered 2017-10-21: 120 ug/kg/min via INTRAVENOUS

## 2017-10-21 MED ORDER — MIDAZOLAM HCL 5 MG/5ML IJ SOLN
INTRAMUSCULAR | Status: AC
  Filled 2017-10-21: qty 5

## 2017-10-21 MED ORDER — PROPOFOL 500 MG/50ML IV EMUL
INTRAVENOUS | Status: AC
  Filled 2017-10-21: qty 50

## 2017-10-21 MED ORDER — SODIUM CHLORIDE 0.9 % IV SOLN: INTRAVENOUS | Status: DC

## 2017-10-21 MED ORDER — SODIUM CHLORIDE 0.9 % IV SOLN
INTRAVENOUS | Status: DC
  Administered 2017-10-21: 1000 mL via INTRAVENOUS

## 2017-10-21 MED ORDER — PROPOFOL 10 MG/ML IV BOLUS
INTRAVENOUS | Status: DC | PRN
  Administered 2017-10-21: 50 mg via INTRAVENOUS

## 2017-10-21 MED ORDER — FENTANYL CITRATE (PF) 250 MCG/5ML IJ SOLN
INTRAMUSCULAR | Status: AC
  Filled 2017-10-21: qty 5

## 2017-10-21 MED ORDER — FENTANYL CITRATE (PF) 250 MCG/5ML IJ SOLN
INTRAMUSCULAR | Status: DC | PRN
  Administered 2017-10-21: 50 ug via INTRAVENOUS

## 2017-10-21 NOTE — H&P (Signed)
Primary Care Physician:  [facility] Primary Gastroenterologist:  Dr. Vira Agar  Pre-Procedure History & Physical: HPI:  Sheri Barron is a 62 y.o. female is here for an colonoscopy.   Past Medical History:  Diagnosis Date  . Anxiety   . Asthma   . Chronic constipation   . COPD (chronic obstructive pulmonary disease) ([place])   . Depression   . Diabetes mellitus without complication ([place])   . GERD (gastroesophageal reflux disease)   . Hypertension   . Stroke [facility])     Past Surgical History:  Procedure Laterality Date  . ABDOMINAL HYSTERECTOMY    . APPENDECTOMY    . CHOLECYSTECTOMY    . COLONOSCOPY WITH PROPOFOL N/A 09/04/2014   Procedure: COLONOSCOPY WITH PROPOFOL;  Surgeon: Manya Silvas, MD;  Location: [facility] ENDOSCOPY;  Service: Endoscopy;  Laterality: N/A;  . EGD-colonsocopy    . ESOPHAGOGASTRODUODENOSCOPY (EGD) WITH PROPOFOL N/A 09/04/2014   Procedure: ESOPHAGOGASTRODUODENOSCOPY (EGD) WITH PROPOFOL;  Surgeon: Manya Silvas, MD;  Location: [facility] ENDOSCOPY;  Service: Endoscopy;  Laterality: N/A;  . SAVORY DILATION N/A 09/04/2014   Procedure: SAVORY DILATION;  Surgeon: Manya Silvas, MD;  Location: [facility] ENDOSCOPY;  Service: Endoscopy;  Laterality: N/A;    Prior to Admission medications   Medication Sig Start Date End Date Taking? Authorizing Provider  albuterol (PROVENTIL HFA;VENTOLIN HFA) 108 (90 BASE) MCG/ACT inhaler Inhale 2 puffs into the lungs every 6 (six) hours as needed for wheezing or shortness of breath. 12/06/14  Yes Harvest Dark, MD  propranolol (INDERAL) 20 MG tablet Take 40 mg by mouth 2 (two) times daily.    Yes [provider]  aspirin 325 MG tablet Take 325 mg by mouth daily.    [provider]  azelastine (ASTELIN) 0.1 % nasal spray Place 2 sprays into both nostrils 2 (two) times daily. Use in each nostril as directed     [provider]  beclomethasone (QVAR) 80 MCG/ACT inhaler Inhale 2 puffs into the  lungs 2 (two) times daily.     [provider]  fluticasone (FLONASE) 50 MCG/ACT nasal spray Place 1 spray into both nostrils 2 (two) times daily.     [provider]  gabapentin (NEURONTIN) 100 MG capsule Take 100 mg by mouth 3 (three) times daily as needed. For pain    [provider]  hydrochlorothiazide (MICROZIDE) 12.5 MG capsule Take 12.5 mg by mouth daily.    [provider]  ibuprofen (ADVIL,MOTRIN) 200 MG tablet Take 800 mg by mouth 3 (three) times daily as needed for moderate pain.    [provider]  lidocaine (LIDODERM) 5 % Place 1 patch onto the skin daily as needed. For pain    [provider]  linaclotide (LINZESS) 145 MCG CAPS capsule Take 145 mcg by mouth daily before breakfast.    [provider]  loratadine (CLARITIN) 10 MG tablet Take 10 mg by mouth daily.    [provider]  metFORMIN (GLUCOPHAGE) 500 MG tablet Take 500 mg by mouth 2 (two) times daily with a meal.    [provider]  omeprazole (PRILOSEC) 20 MG capsule Take 20 mg by mouth daily.    [provider]  polyethylene glycol (MIRALAX / GLYCOLAX) packet Take 17 g by mouth at bedtime.    [provider]  senna (SENOKOT) 8.6 MG TABS tablet Take 1 tablet by mouth daily.    [provider]  sucralfate (CARAFATE) 1 G tablet Take 1 g by  mouth 4 (four) times daily -  before meals and at bedtime.     [provider]  tiotropium (SPIRIVA) 18 MCG inhalation capsule Place 18 mcg into inhaler and inhale daily.    [provider]  traZODone (DESYREL) 100 MG tablet Take 300 mg by mouth at bedtime.     [provider]  ursodiol (ACTIGALL) 300 MG capsule Take 300 mg by mouth 2 (two) times daily.    [provider]  venlafaxine XR (EFFEXOR-XR) 150 MG 24 hr capsule Take 150 mg by mouth daily with breakfast.    [provider]    Allergies as of 08/22/2017 - Review Complete 04/13/2016   Allergen Reaction Noted  . Sulfa antibiotics Nausea Only 08/02/2014    Family History  Problem Relation Age of Onset  . Breast cancer Neg Hx     Social History   Socioeconomic History  . Marital status: Widowed    Spouse name: Not on file  . Number of children: Not on file  . Years of education: Not on file  . Highest education level: Not on file  Occupational History  . Not on file  Social Needs  . Financial resource strain: Not on file  . Food insecurity:    Worry: Not on file    Inability: Not on file  . Transportation needs:    Medical: Not on file    Non-medical: Not on file  Tobacco Use  . Smoking status: Current Every Day Smoker    Packs/day: 0.50    Types: Cigarettes  . Smokeless tobacco: Never Used  Substance and Sexual Activity  . Alcohol use: No    Alcohol/week: 0.0 standard drinks  . Drug use: Yes    Types: Marijuana  . Sexual activity: Not on file  Lifestyle  . Physical activity:    Days per week: Not on file    Minutes per session: Not on file  . Stress: Not on file  Relationships  . Social connections:    Talks on phone: Not on file    Gets together: Not on file    Attends religious service: Not on file    Active member of club or organization: Not on file    Attends meetings of clubs or organizations: Not on file    Relationship status: Not on file  . Intimate partner violence:    Fear of current or ex partner: Not on file    Emotionally abused: Not on file    Physically abused: Not on file    Forced sexual activity: Not on file  Other Topics Concern  . Not on file  Social History Narrative  . Not on file    Review of Systems: See HPI, otherwise negative ROS  Physical Exam: BP 140/84   Pulse 87   Temp (!) 97.1 F (36.2 C) (Tympanic)   Resp 20   Ht 5' (1.524 m)   Wt 72.1 kg   SpO2 97%   BMI 31.05 kg/m  General:   Alert,  pleasant and cooperative in NAD Head:  Normocephalic and atraumatic. Neck:  Supple; no masses or  thyromegaly. Lungs:  Clear throughout to auscultation.    Heart:  Regular rate and rhythm. Abdomen:  Soft, nontender and nondistended. Normal bowel sounds, without guarding, and without rebound.   Neurologic:  Alert and  oriented x4;  grossly normal neurologically.  Impression/Plan: Sheri Barron is here for an colonoscopy to be performed for chronic constipation  Risks, benefits, limitations,  and alternatives regarding  colonoscopy have been reviewed with the patient.  Questions have been answered.  All parties agreeable.   Gaylyn Cheers, MD  10/21/2017, 7:45 AM

## 2017-10-21 NOTE — Anesthesia Preprocedure Evaluation (Signed)
Anesthesia Evaluation  Patient identified by MRN, date of birth, ID band Patient awake    Reviewed: Allergy & Precautions, NPO status , Patient's Chart, lab work & pertinent test results, reviewed documented beta blocker date and time   Airway Mallampati: II  TM Distance: >3 FB     Dental  (+) Chipped   Pulmonary asthma , COPD, Current Smoker,           Cardiovascular hypertension, Pt. on medications and Pt. on home beta blockers      Neuro/Psych PSYCHIATRIC DISORDERS Anxiety Depression CVA, Residual Symptoms    GI/Hepatic GERD  ,  Endo/Other  diabetes, Type 2  Renal/GU      Musculoskeletal   Abdominal   Peds  Hematology   Anesthesia Other Findings Smokes.  Reproductive/Obstetrics                             Anesthesia Physical Anesthesia Plan  ASA: III  Anesthesia Plan: General   Post-op Pain Management:    Induction: Intravenous  PONV Risk Score and Plan:   Airway Management Planned:   Additional Equipment:   Intra-op Plan:   Post-operative Plan:   Informed Consent: I have reviewed the patients History and Physical, chart, labs and discussed the procedure including the risks, benefits and alternatives for the proposed anesthesia with the patient or authorized representative who has indicated his/her understanding and acceptance.     Plan Discussed with: CRNA  Anesthesia Plan Comments:         Anesthesia Quick Evaluation

## 2017-10-21 NOTE — Op Note (Signed)
[facility] Gastroenterology Patient Name: Sheri Barron Procedure Date: 10/21/2017 7:52 AM MRN: [id number] Account #: [id number] Date of Birth: 1955/06/17 Admit Type: Outpatient Age: 62 Room: [facility] ENDO ROOM 1 Gender: Female Note Status: Finalized Procedure:            Colonoscopy Indications:          Constipation Providers:            Manya Silvas, MD Referring MD:         [place], MD (Referring MD) Medicines:            Propofol per Anesthesia Complications:        No immediate complications. Procedure:            Pre-Anesthesia Assessment:                       - After reviewing the risks and benefits, the patient                        was deemed in satisfactory condition to undergo the                        procedure.                       After obtaining informed consent, the colonoscope was                        passed under direct vision. Throughout the procedure,                        the patient's blood pressure, pulse, and oxygen                        saturations were monitored continuously. The                        Colonoscope was introduced through the anus and                        advanced to the the cecum, identified by appendiceal                        orifice and ileocecal valve. The colonoscopy was                        performed without difficulty. The patient tolerated the                        procedure well. The quality of the bowel preparation                        was good. Findings:      A small polyp was found in the rectum. The polyp was sessile. The polyp       was removed with a hot snare. Resection and retrieval were complete.      A few small-mouthed diverticula were found in the sigmoid colon.      The exam was otherwise without abnormality. Impression:           - One small polyp in the rectum, removed with a  hot                        snare. Resected and retrieved.        - Diverticulosis in the sigmoid colon.                       - The examination was otherwise normal. Recommendation:       - Await pathology results. Manya Silvas, MD 10/21/2017 8:23:00 AM This report has been signed electronically. Number of Addenda: 0 Note Initiated On: 10/21/2017 7:52 AM Scope Withdrawal Time: 0 hours 12 minutes 34 seconds  Total Procedure Duration: 0 hours 18 minutes 30 seconds       [facility]

## 2017-10-21 NOTE — Transfer of Care (Signed)
Immediate Anesthesia Transfer of Care Note  Patient: Sheri Barron  Procedure(s) Performed: COLONOSCOPY WITH PROPOFOL (N/A )  Patient Location: PACU and Endoscopy Unit  Anesthesia Type:General  Level of Consciousness: awake, alert  and oriented  Airway & Oxygen Therapy: Patient Spontanous Breathing  Post-op Assessment: Report given to RN  Post vital signs: Reviewed and stable  Last Vitals:  Vitals Value Taken Time  BP 111/54 10/21/2017  8:26 AM  Temp 36.3 C 10/21/2017  8:26 AM  Pulse 102 10/21/2017  8:29 AM  Resp 19 10/21/2017  8:29 AM  SpO2 97 % 10/21/2017  8:29 AM  Vitals shown include unvalidated device data.  Last Pain:  Vitals:   10/21/17 0826  TempSrc: Tympanic  PainSc: 0-No pain         Complications: No apparent anesthesia complications

## 2017-10-21 NOTE — Anesthesia Post-op Follow-up Note (Signed)
Anesthesia QCDR form completed.        

## 2017-10-21 NOTE — Anesthesia Postprocedure Evaluation (Signed)
Anesthesia Post Note  Patient: Sheri Barron  Procedure(s) Performed: COLONOSCOPY WITH PROPOFOL (N/A )  Patient location during evaluation: Endoscopy Anesthesia Type: General Level of consciousness: awake and alert Pain management: pain level controlled Vital Signs Assessment: post-procedure vital signs reviewed and stable Respiratory status: spontaneous breathing, nonlabored ventilation, respiratory function stable and patient connected to nasal cannula oxygen Cardiovascular status: blood pressure returned to baseline and stable Postop Assessment: no apparent nausea or vomiting Anesthetic complications: no     Last Vitals:  Vitals:   10/21/17 0729 10/21/17 0826  BP: 140/84 (!) 111/54  Pulse: 87 (!) 103  Resp: 20 (!) 28  Temp: (!) 36.2 C (!) 36.3 C  SpO2: 97% 98%    Last Pain:  Vitals:   10/21/17 0826  TempSrc: Tympanic  PainSc: 0-No pain                 Cadyn Fann S

## 2017-10-24 LAB — SURGICAL PATHOLOGY

## 2017-10-27 ENCOUNTER — Other Ambulatory Visit: Payer: Self-pay | Admitting: Nurse Practitioner

## 2017-10-27 DIAGNOSIS — K760 Fatty (change of) liver, not elsewhere classified: Secondary | ICD-10-CM

## 2017-11-01 ENCOUNTER — Ambulatory Visit
Admission: RE | Admit: 2017-11-01 | Discharge: 2017-11-01 | Disposition: A | Discharge status: Home / Self Care | Payer: Medicare HMO | Source: Ambulatory Visit | Attending: Nurse Practitioner | Admitting: Nurse Practitioner

## 2017-11-14 ENCOUNTER — Ambulatory Visit: Payer: Medicare HMO | Attending: Nurse Practitioner

## 2018-03-07 ENCOUNTER — Other Ambulatory Visit: Payer: Self-pay | Admitting: Internal Medicine

## 2018-03-07 DIAGNOSIS — Z1231 Encounter for screening mammogram for malignant neoplasm of breast: Secondary | ICD-10-CM

## 2018-03-08 ENCOUNTER — Telehealth: Payer: Self-pay | Admitting: *Deleted

## 2018-03-08 NOTE — Telephone Encounter (Signed)
Received referral for low dose lung cancer screening CT scan. Message left at phone number listed in EMR for patient to call me back to facilitate scheduling scan.  

## 2018-03-15 ENCOUNTER — Telehealth: Payer: Self-pay | Admitting: *Deleted

## 2018-03-15 NOTE — Telephone Encounter (Signed)
Received referral for low dose lung cancer screening CT scan. Message left at phone number listed in EMR for patient to call me back to facilitate scheduling scan.  

## 2018-03-16 ENCOUNTER — Telehealth: Payer: Self-pay | Admitting: *Deleted

## 2018-03-16 NOTE — Telephone Encounter (Signed)
Received referral for low dose lung cancer screening CT scan. Message left at phone number listed in EMR for patient to call me back to facilitate scheduling scan.  

## 2018-03-17 ENCOUNTER — Encounter: Payer: Self-pay | Admitting: *Deleted

## 2018-05-01 IMAGING — CT CT CHEST W/O CM
1 series · 15 of 34 positions shown, 19 images · non-contrast
Comparison: Report of a screening CT from outside dated 06/25/2015.
Prior images not available. Chest radiograph of 11/14/2015 also
reviewed.

CLINICAL DATA: Prior screening CT demonstrating right upper lobe 3
mm and left upper lobe 2 mm pulmonary nodules.

EXAM:
CT CHEST WITHOUT CONTRAST
TECHNIQUE: Multidetector CT imaging of the chest was performed following the
standard protocol without IV contrast.

[Series 2: thorax · axial · 0.71mm/px · z∈[-707,-453]mm · 15 of 151 slices shown, 19 images]
[im 12/151  mediastinal]
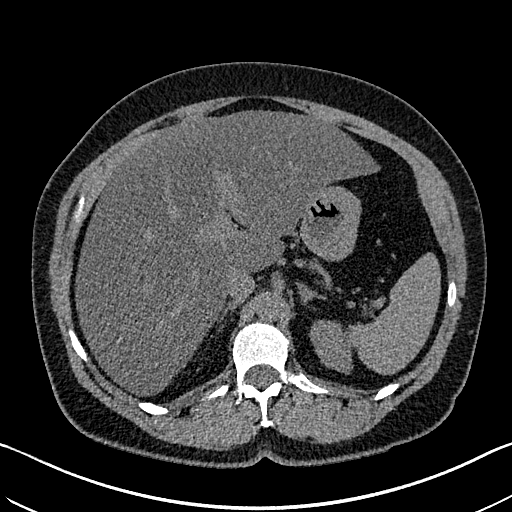
[im 12/151  lung]
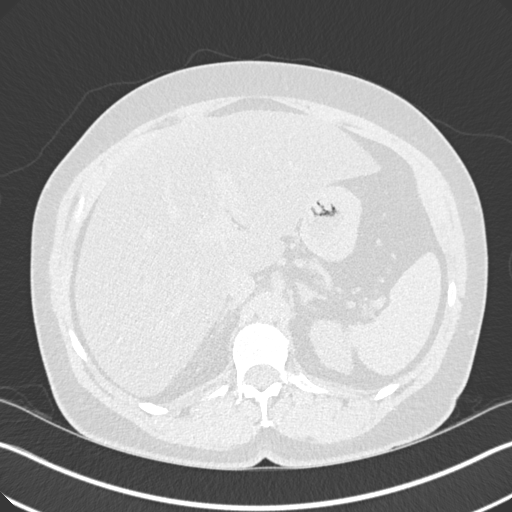
[im 23/151  lung]
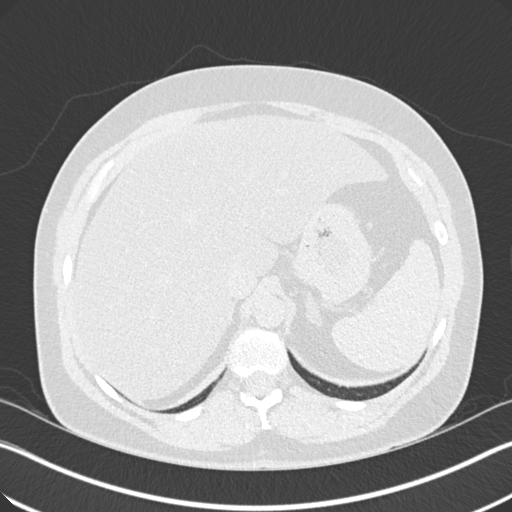
[im 31/151  lung]
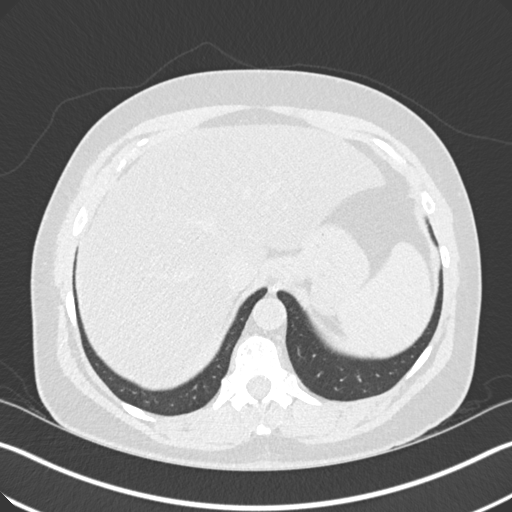
[im 39/151  lung]
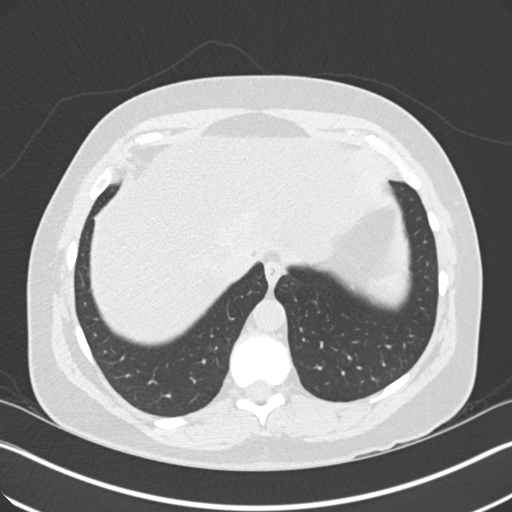
[im 51/151  mediastinal]
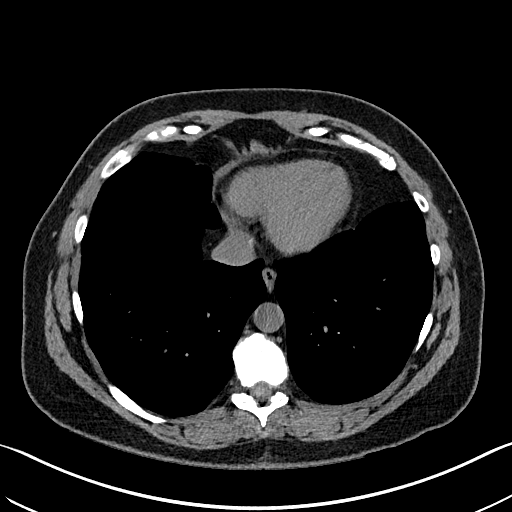
[im 51/151  lung]
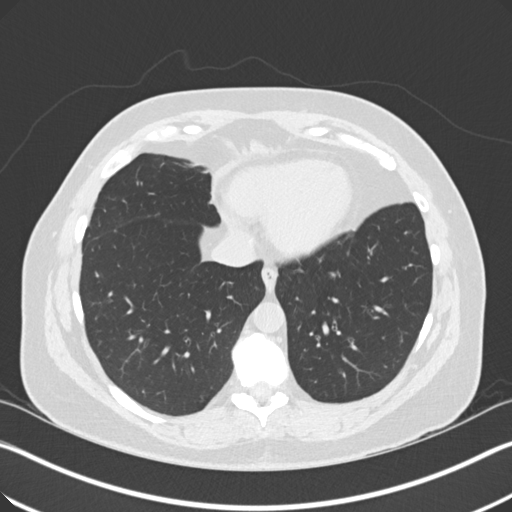
[im 61/151  lung]
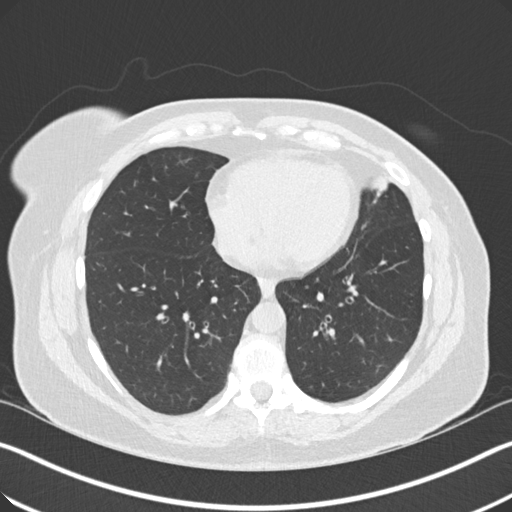
[im 67/151  lung]
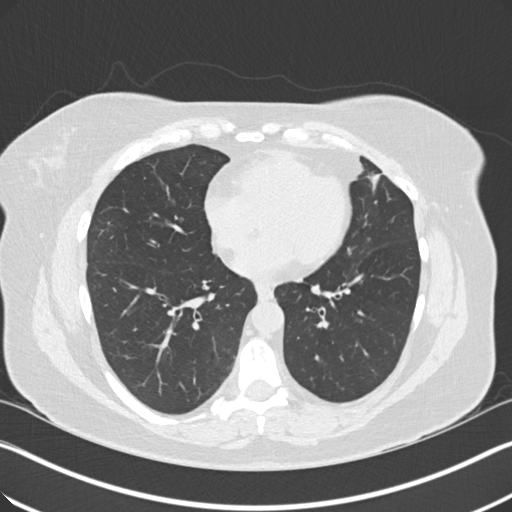
[im 78/151  lung]
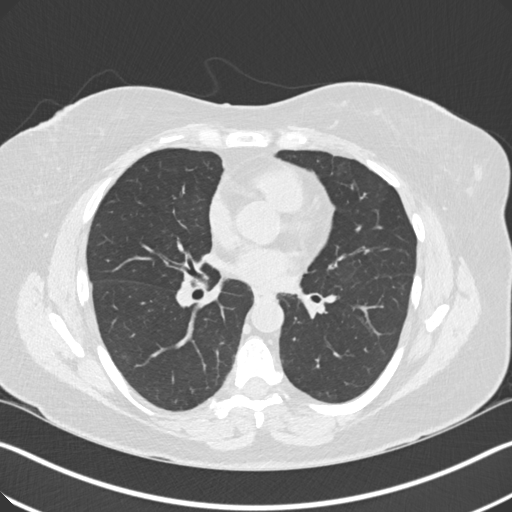
[im 84/151  mediastinal]
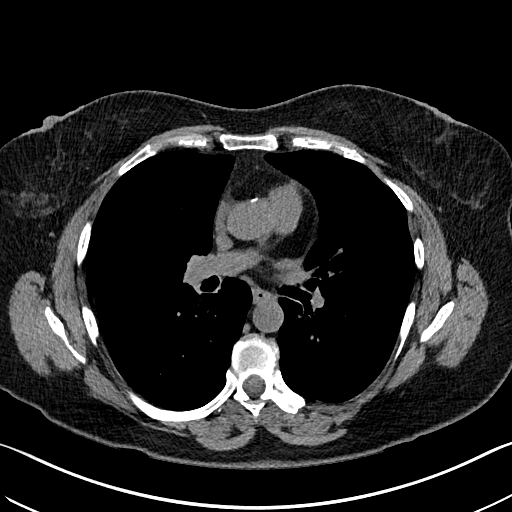
[im 84/151  lung]
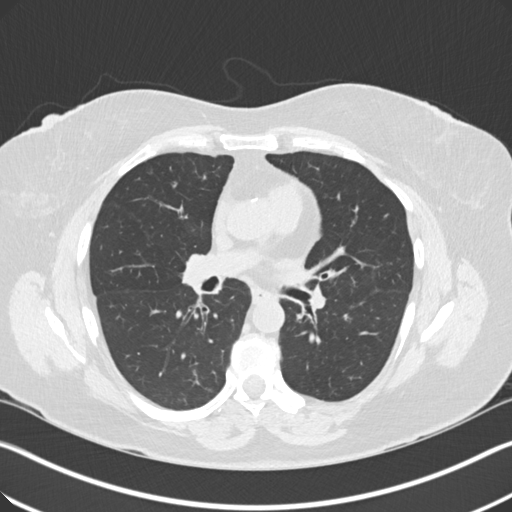
[im 91/151  lung]
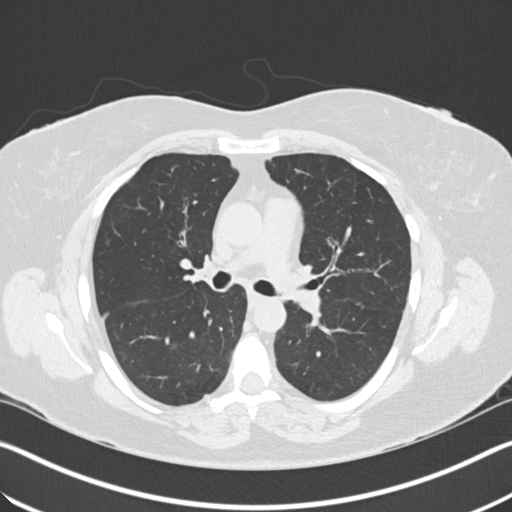
[im 101/151  lung]
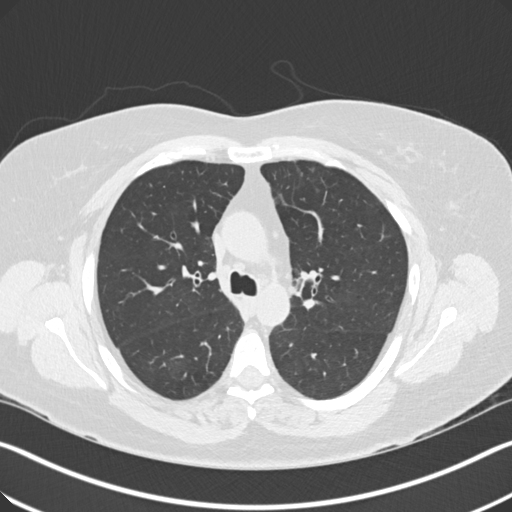
[im 112/151  lung]
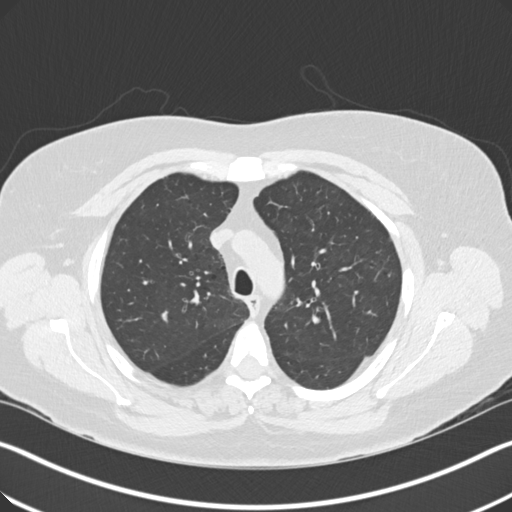
[im 121/151  mediastinal]
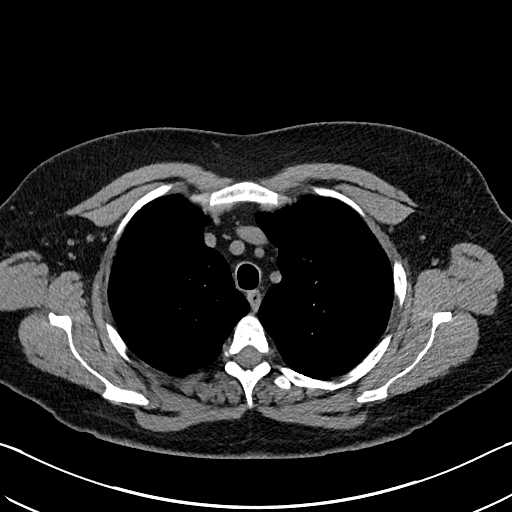
[im 121/151  lung]
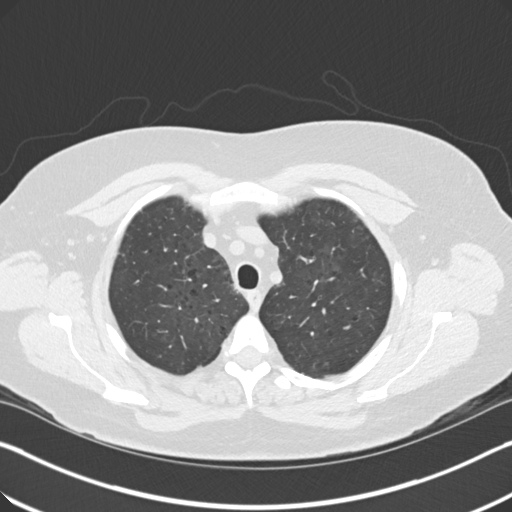
[im 128/151  lung]
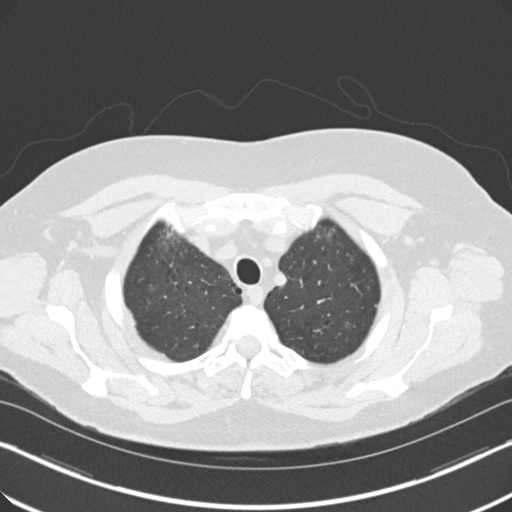
[im 139/151  lung]
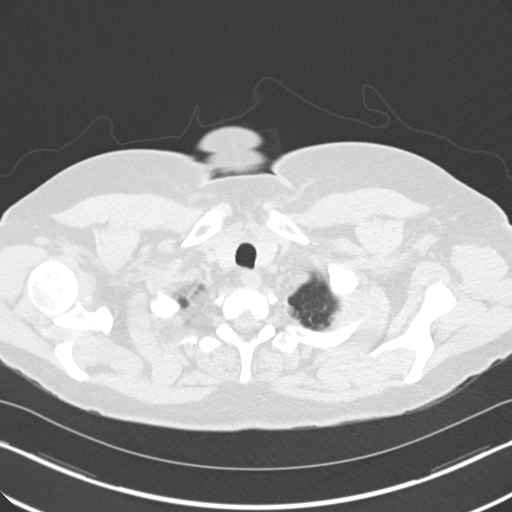

[15 of 34 positions shown; findings below may reference images not displayed]

FINDINGS: Cardiovascular: Normal heart size, without pericardial effusion.

Mediastinum/Nodes: No mediastinal or definite hilar adenopathy,
given limitations of unenhanced CT.

Lungs/Pleura: No pleural fluid. Lower lobe predominant bronchial
wall thickening. Mild centrilobular emphysema.

4 mm right upper lobe pulmonary nodule on image 26/ series 3.

Posterior right upper lobe 4 mm nodule on image 43/series 3.
Lingular volume loss with scarring.

Subpleural right upper lobe pulmonary nodule laterally measures 2 mm
on image 45/series 3.

A 2 mm left upper lobe pulmonary nodule on image 34/series 3.

Upper Abdomen: Marked hepatic steatosis. Cholecystectomy. Normal
imaged portions of the spleen, stomach, pancreas, right adrenal
gland, kidneys. Mild left adrenal thickening.

Musculoskeletal: Mild mid thoracic spondylosis.
IMPRESSION: 1. Mild centrilobular emphysema with bilateral nonspecific upper
lobe pulmonary nodules. Please note that this study was performed as
a diagnostic CT. The patient will be eligible for repeat screening
CT after 06/24/2016.
2. Hepatic steatosis.

## 2021-01-08 ENCOUNTER — Other Ambulatory Visit: Payer: Self-pay | Admitting: Family Medicine

## 2021-01-08 DIAGNOSIS — Z1231 Encounter for screening mammogram for malignant neoplasm of breast: Secondary | ICD-10-CM

## 2022-11-16 ENCOUNTER — Other Ambulatory Visit: Payer: Self-pay | Admitting: Family Medicine

## 2022-11-16 DIAGNOSIS — Z1231 Encounter for screening mammogram for malignant neoplasm of breast: Secondary | ICD-10-CM

## 2022-11-28 ENCOUNTER — Emergency Department: Payer: Medicare Other

## 2022-11-28 ENCOUNTER — Inpatient Hospital Stay
Admission: EM | Admit: 2022-11-28 | Discharge: 2022-12-05 | DRG: 871 | Disposition: A | Discharge status: Home / Self Care | Payer: Medicare Other | Attending: Osteopathic Medicine | Admitting: Osteopathic Medicine

## 2022-11-28 ENCOUNTER — Other Ambulatory Visit: Payer: Self-pay

## 2022-11-28 DIAGNOSIS — E1165 Type 2 diabetes mellitus with hyperglycemia: Secondary | ICD-10-CM | POA: Diagnosis present

## 2022-11-28 DIAGNOSIS — F17210 Nicotine dependence, cigarettes, uncomplicated: Secondary | ICD-10-CM | POA: Diagnosis present

## 2022-11-28 DIAGNOSIS — E119 Type 2 diabetes mellitus without complications: Secondary | ICD-10-CM | POA: Diagnosis not present

## 2022-11-28 DIAGNOSIS — R251 Tremor, unspecified: Secondary | ICD-10-CM | POA: Diagnosis present

## 2022-11-28 DIAGNOSIS — Z7984 Long term (current) use of oral hypoglycemic drugs: Secondary | ICD-10-CM | POA: Diagnosis not present

## 2022-11-28 DIAGNOSIS — I639 Cerebral infarction, unspecified: Secondary | ICD-10-CM | POA: Diagnosis not present

## 2022-11-28 DIAGNOSIS — Z79899 Other long term (current) drug therapy: Secondary | ICD-10-CM | POA: Diagnosis not present

## 2022-11-28 DIAGNOSIS — J9621 Acute and chronic respiratory failure with hypoxia: Secondary | ICD-10-CM | POA: Diagnosis present

## 2022-11-28 DIAGNOSIS — J9622 Acute and chronic respiratory failure with hypercapnia: Secondary | ICD-10-CM | POA: Diagnosis present

## 2022-11-28 DIAGNOSIS — Z9981 Dependence on supplemental oxygen: Secondary | ICD-10-CM | POA: Diagnosis not present

## 2022-11-28 DIAGNOSIS — E785 Hyperlipidemia, unspecified: Secondary | ICD-10-CM | POA: Diagnosis present

## 2022-11-28 DIAGNOSIS — F411 Generalized anxiety disorder: Secondary | ICD-10-CM | POA: Diagnosis present

## 2022-11-28 DIAGNOSIS — R5381 Other malaise: Secondary | ICD-10-CM | POA: Diagnosis present

## 2022-11-28 DIAGNOSIS — Z66 Do not resuscitate: Secondary | ICD-10-CM | POA: Diagnosis present

## 2022-11-28 DIAGNOSIS — K589 Irritable bowel syndrome without diarrhea: Secondary | ICD-10-CM | POA: Diagnosis present

## 2022-11-28 DIAGNOSIS — J441 Chronic obstructive pulmonary disease with (acute) exacerbation: Principal | ICD-10-CM | POA: Diagnosis present

## 2022-11-28 DIAGNOSIS — K5909 Other constipation: Secondary | ICD-10-CM | POA: Diagnosis present

## 2022-11-28 DIAGNOSIS — B348 Other viral infections of unspecified site: Secondary | ICD-10-CM | POA: Diagnosis present

## 2022-11-28 DIAGNOSIS — F418 Other specified anxiety disorders: Secondary | ICD-10-CM | POA: Diagnosis not present

## 2022-11-28 DIAGNOSIS — G8929 Other chronic pain: Secondary | ICD-10-CM | POA: Diagnosis present

## 2022-11-28 DIAGNOSIS — I10 Essential (primary) hypertension: Secondary | ICD-10-CM | POA: Diagnosis present

## 2022-11-28 DIAGNOSIS — Z7982 Long term (current) use of aspirin: Secondary | ICD-10-CM

## 2022-11-28 DIAGNOSIS — J449 Chronic obstructive pulmonary disease, unspecified: Secondary | ICD-10-CM | POA: Diagnosis present

## 2022-11-28 DIAGNOSIS — Z720 Tobacco use: Secondary | ICD-10-CM | POA: Diagnosis present

## 2022-11-28 DIAGNOSIS — E872 Acidosis, unspecified: Secondary | ICD-10-CM | POA: Diagnosis present

## 2022-11-28 DIAGNOSIS — R002 Palpitations: Secondary | ICD-10-CM | POA: Diagnosis present

## 2022-11-28 DIAGNOSIS — F32A Depression, unspecified: Secondary | ICD-10-CM | POA: Diagnosis present

## 2022-11-28 DIAGNOSIS — J439 Emphysema, unspecified: Secondary | ICD-10-CM | POA: Diagnosis present

## 2022-11-28 DIAGNOSIS — J440 Chronic obstructive pulmonary disease with acute lower respiratory infection: Secondary | ICD-10-CM | POA: Diagnosis present

## 2022-11-28 DIAGNOSIS — Z1152 Encounter for screening for COVID-19: Secondary | ICD-10-CM | POA: Diagnosis not present

## 2022-11-28 DIAGNOSIS — J189 Pneumonia, unspecified organism: Secondary | ICD-10-CM | POA: Diagnosis present

## 2022-11-28 DIAGNOSIS — R6520 Severe sepsis without septic shock: Secondary | ICD-10-CM | POA: Diagnosis present

## 2022-11-28 DIAGNOSIS — F17200 Nicotine dependence, unspecified, uncomplicated: Secondary | ICD-10-CM | POA: Diagnosis present

## 2022-11-28 DIAGNOSIS — Z90710 Acquired absence of both cervix and uterus: Secondary | ICD-10-CM

## 2022-11-28 DIAGNOSIS — K219 Gastro-esophageal reflux disease without esophagitis: Secondary | ICD-10-CM | POA: Diagnosis present

## 2022-11-28 DIAGNOSIS — Z7951 Long term (current) use of inhaled steroids: Secondary | ICD-10-CM

## 2022-11-28 DIAGNOSIS — M549 Dorsalgia, unspecified: Secondary | ICD-10-CM | POA: Diagnosis present

## 2022-11-28 DIAGNOSIS — J180 Bronchopneumonia, unspecified organism: Secondary | ICD-10-CM | POA: Diagnosis present

## 2022-11-28 DIAGNOSIS — Z8673 Personal history of transient ischemic attack (TIA), and cerebral infarction without residual deficits: Secondary | ICD-10-CM | POA: Diagnosis present

## 2022-11-28 DIAGNOSIS — Z882 Allergy status to sulfonamides status: Secondary | ICD-10-CM

## 2022-11-28 DIAGNOSIS — A419 Sepsis, unspecified organism: Secondary | ICD-10-CM | POA: Diagnosis present

## 2022-11-28 DIAGNOSIS — R0602 Shortness of breath: Secondary | ICD-10-CM | POA: Diagnosis present

## 2022-11-28 LAB — CBC WITH DIFFERENTIAL/PLATELET
Abs Immature Granulocytes: 0.03 10*3/uL (ref 0.00–0.07)
Basophils Absolute: 0 10*3/uL (ref 0.0–0.1)
Basophils Relative: 0 %
Eosinophils Absolute: 0 10*3/uL (ref 0.0–0.5)
Eosinophils Relative: 0 %
HCT: 38.9 % (ref 36.0–46.0)
Hemoglobin: 11.9 g/dL — ABNORMAL LOW (ref 12.0–15.0)
Immature Granulocytes: 0 %
Lymphocytes Relative: 29 %
Lymphs Abs: 2.5 10*3/uL (ref 0.7–4.0)
MCH: 28.1 pg (ref 26.0–34.0)
MCHC: 30.6 g/dL (ref 30.0–36.0)
MCV: 91.7 fL (ref 80.0–100.0)
Monocytes Absolute: 0.6 10*3/uL (ref 0.1–1.0)
Monocytes Relative: 7 %
Neutro Abs: 5.4 10*3/uL (ref 1.7–7.7)
Neutrophils Relative %: 64 %
Platelets: 174 10*3/uL (ref 150–400)
RBC: 4.24 MIL/uL (ref 3.87–5.11)
RDW: 14.3 % (ref 11.5–15.5)
WBC: 8.4 10*3/uL (ref 4.0–10.5)
nRBC: 0 % (ref 0.0–0.2)

## 2022-11-28 LAB — COMPREHENSIVE METABOLIC PANEL
ALT: 15 U/L (ref 0–44)
AST: 16 U/L (ref 15–41)
Albumin: 3.7 g/dL (ref 3.5–5.0)
Alkaline Phosphatase: 73 U/L (ref 38–126)
Anion gap: 11 (ref 5–15)
BUN: 22 mg/dL (ref 8–23)
CO2: 34 mmol/L — ABNORMAL HIGH (ref 22–32)
Calcium: 8.7 mg/dL — ABNORMAL LOW (ref 8.9–10.3)
Chloride: 94 mmol/L — ABNORMAL LOW (ref 98–111)
Creatinine, Ser: 1.01 mg/dL — ABNORMAL HIGH (ref 0.44–1.00)
GFR, Estimated: >60 mL/min (ref >60)
Glucose, Bld: 102 mg/dL — ABNORMAL HIGH (ref 70–99)
Potassium: 4.1 mmol/L (ref 3.5–5.1)
Sodium: 139 mmol/L (ref 135–145)
Total Bilirubin: 0.7 mg/dL (ref <1.2)
Total Protein: 7.2 g/dL (ref 6.5–8.1)

## 2022-11-28 LAB — LACTIC ACID, PLASMA
Lactic Acid, Venous: 2.4 mmol/L (ref 0.5–1.9)
Lactic Acid, Venous: 2 mmol/L (ref 0.5–1.9)

## 2022-11-28 LAB — RESP PANEL BY RT-PCR (RSV, FLU A&B, COVID)  RVPGX2
Influenza A by PCR: NEGATIVE
Influenza B by PCR: NEGATIVE
Resp Syncytial Virus by PCR: NEGATIVE
SARS Coronavirus 2 by RT PCR: NEGATIVE

## 2022-11-28 LAB — URINALYSIS, W/ REFLEX TO CULTURE (INFECTION SUSPECTED)
Bacteria, UA: NONE SEEN
Bilirubin Urine: NEGATIVE
Glucose, UA: NEGATIVE mg/dL
Hgb urine dipstick: NEGATIVE
Ketones, ur: 20 mg/dL — AB
Leukocytes,Ua: NEGATIVE
Nitrite: NEGATIVE
Protein, ur: NEGATIVE mg/dL
Specific Gravity, Urine: 1.039 — ABNORMAL HIGH (ref 1.005–1.030)
pH: 5 (ref 5.0–8.0)

## 2022-11-28 LAB — MAGNESIUM: Magnesium: 1.7 mg/dL (ref 1.7–2.4)

## 2022-11-28 LAB — PROCALCITONIN: Procalcitonin: <0.1 ng/mL

## 2022-11-28 LAB — CBG MONITORING, ED: Glucose-Capillary: 189 mg/dL — ABNORMAL HIGH (ref 70–99)

## 2022-11-28 MED ORDER — DM-GUAIFENESIN ER 30-600 MG PO TB12
1.0000 | ORAL_TABLET | Freq: Two times a day (BID) | ORAL | Status: DC | PRN
  Administered 2022-11-29 – 2022-12-02 (×3): 1 via ORAL
  Filled 2022-11-28 (×4): qty 1

## 2022-11-28 MED ORDER — SENNOSIDES-DOCUSATE SODIUM 8.6-50 MG PO TABS
1.0000 | ORAL_TABLET | Freq: Two times a day (BID) | ORAL | Status: DC
  Administered 2022-11-28 – 2022-12-01 (×5): 1 via ORAL
  Filled 2022-11-28 (×6): qty 1

## 2022-11-28 MED ORDER — METHYLPREDNISOLONE SODIUM SUCC 125 MG IJ SOLR
80.0000 mg | Freq: Two times a day (BID) | INTRAMUSCULAR | Status: DC
  Administered 2022-11-28: 80 mg via INTRAVENOUS
  Filled 2022-11-28: qty 2

## 2022-11-28 MED ORDER — HYDRALAZINE HCL 20 MG/ML IJ SOLN: 5.0000 mg | INTRAMUSCULAR | Status: DC | PRN

## 2022-11-28 MED ORDER — GABAPENTIN 100 MG PO CAPS: 100.0000 mg | ORAL_CAPSULE | Freq: Three times a day (TID) | ORAL | Status: DC | PRN

## 2022-11-28 MED ORDER — ACETAMINOPHEN 325 MG PO TABS
650.0000 mg | ORAL_TABLET | Freq: Four times a day (QID) | ORAL | Status: DC | PRN
  Administered 2022-11-29 – 2022-12-04 (×6): 650 mg via ORAL
  Filled 2022-11-28 (×6): qty 2

## 2022-11-28 MED ORDER — SODIUM CHLORIDE 0.9 % IV BOLUS
500.0000 mL | Freq: Once | INTRAVENOUS | Status: AC
  Administered 2022-11-28: 500 mL via INTRAVENOUS

## 2022-11-28 MED ORDER — SODIUM CHLORIDE 0.9 % IV BOLUS (SEPSIS)
1000.0000 mL | Freq: Once | INTRAVENOUS | Status: AC
  Administered 2022-11-28: 1000 mL via INTRAVENOUS

## 2022-11-28 MED ORDER — PROPRANOLOL HCL 20 MG PO TABS
40.0000 mg | ORAL_TABLET | Freq: Two times a day (BID) | ORAL | Status: DC
  Administered 2022-11-28: 40 mg via ORAL
  Filled 2022-11-28: qty 2

## 2022-11-28 MED ORDER — PANTOPRAZOLE SODIUM 40 MG PO TBEC
40.0000 mg | DELAYED_RELEASE_TABLET | Freq: Every day | ORAL | Status: DC
  Administered 2022-11-28 – 2022-12-05 (×8): 40 mg via ORAL
  Filled 2022-11-28 (×8): qty 1

## 2022-11-28 MED ORDER — TRAZODONE HCL 100 MG PO TABS
300.0000 mg | ORAL_TABLET | Freq: Every day | ORAL | Status: DC
Start: 2022-11-28 — End: 2022-12-05
  Administered 2022-11-28 – 2022-12-04 (×7): 300 mg via ORAL
  Filled 2022-11-28 (×7): qty 3

## 2022-11-28 MED ORDER — BUSPIRONE HCL 10 MG PO TABS
15.0000 mg | ORAL_TABLET | Freq: Two times a day (BID) | ORAL | Status: DC
  Administered 2022-11-29 – 2022-12-05 (×14): 15 mg via ORAL
  Filled 2022-11-28 (×5): qty 2
  Filled 2022-11-28: qty 3
  Filled 2022-11-28: qty 2
  Filled 2022-11-28: qty 3
  Filled 2022-11-28 (×6): qty 2

## 2022-11-28 MED ORDER — IPRATROPIUM-ALBUTEROL 0.5-2.5 (3) MG/3ML IN SOLN
3.0000 mL | RESPIRATORY_TRACT | Status: DC
  Administered 2022-11-28 – 2022-11-29 (×3): 3 mL via RESPIRATORY_TRACT
  Filled 2022-11-28 (×3): qty 3

## 2022-11-28 MED ORDER — INSULIN ASPART 100 UNIT/ML IJ SOLN
0.0000 [IU] | Freq: Every day | INTRAMUSCULAR | Status: DC
  Administered 2022-12-03: 2 [IU] via SUBCUTANEOUS
  Filled 2022-11-28: qty 1

## 2022-11-28 MED ORDER — ONDANSETRON HCL 4 MG/2ML IJ SOLN: 4.0000 mg | Freq: Three times a day (TID) | INTRAMUSCULAR | Status: DC | PRN

## 2022-11-28 MED ORDER — POLYETHYLENE GLYCOL 3350 17 G PO PACK: 17.0000 g | PACK | Freq: Every day | ORAL | Status: DC | PRN

## 2022-11-28 MED ORDER — LINACLOTIDE 145 MCG PO CAPS
145.0000 ug | ORAL_CAPSULE | Freq: Every day | ORAL | Status: DC
  Administered 2022-11-29 – 2022-12-01 (×3): 145 ug via ORAL
  Filled 2022-11-28 (×4): qty 1

## 2022-11-28 MED ORDER — ASPIRIN 325 MG PO TABS: 325.0000 mg | ORAL_TABLET | Freq: Every day | ORAL | Status: DC

## 2022-11-28 MED ORDER — IPRATROPIUM-ALBUTEROL 0.5-2.5 (3) MG/3ML IN SOLN
3.0000 mL | Freq: Once | RESPIRATORY_TRACT | Status: AC
  Administered 2022-11-28: 3 mL via RESPIRATORY_TRACT
  Filled 2022-11-28: qty 3

## 2022-11-28 MED ORDER — ALBUTEROL SULFATE (2.5 MG/3ML) 0.083% IN NEBU: 2.5000 mg | INHALATION_SOLUTION | RESPIRATORY_TRACT | Status: DC | PRN

## 2022-11-28 MED ORDER — IOHEXOL 350 MG/ML SOLN
75.0000 mL | Freq: Once | INTRAVENOUS | Status: AC | PRN
  Administered 2022-11-28: 75 mL via INTRAVENOUS

## 2022-11-28 MED ORDER — ENOXAPARIN SODIUM 40 MG/0.4ML IJ SOSY
40.0000 mg | PREFILLED_SYRINGE | INTRAMUSCULAR | Status: DC
  Administered 2022-11-28 – 2022-12-04 (×7): 40 mg via SUBCUTANEOUS
  Filled 2022-11-28 (×7): qty 0.4

## 2022-11-28 MED ORDER — INSULIN ASPART 100 UNIT/ML IJ SOLN
0.0000 [IU] | Freq: Three times a day (TID) | INTRAMUSCULAR | Status: DC
  Administered 2022-11-29 (×2): 1 [IU] via SUBCUTANEOUS
  Administered 2022-11-29 – 2022-12-02 (×4): 2 [IU] via SUBCUTANEOUS
  Administered 2022-12-02: 1 [IU] via SUBCUTANEOUS
  Administered 2022-12-03: 2 [IU] via SUBCUTANEOUS
  Administered 2022-12-04: 3 [IU] via SUBCUTANEOUS
  Filled 2022-11-28 (×11): qty 1

## 2022-11-28 MED ORDER — SODIUM CHLORIDE 0.9 % IV SOLN
2.0000 g | INTRAVENOUS | Status: AC
  Administered 2022-11-28 – 2022-12-02 (×5): 2 g via INTRAVENOUS
  Filled 2022-11-28 (×5): qty 20

## 2022-11-28 MED ORDER — SODIUM CHLORIDE 0.9 % IV SOLN: INTRAVENOUS | Status: DC

## 2022-11-28 MED ORDER — DEXTROSE 5 % IV SOLN
500.0000 mg | INTRAVENOUS | Status: DC
  Administered 2022-11-28: 500 mg via INTRAVENOUS
  Filled 2022-11-28: qty 5

## 2022-11-28 MED ORDER — NICOTINE 21 MG/24HR TD PT24
21.0000 mg | MEDICATED_PATCH | Freq: Every day | TRANSDERMAL | Status: DC
  Administered 2022-11-28 – 2022-12-05 (×8): 21 mg via TRANSDERMAL
  Filled 2022-11-28 (×8): qty 1

## 2022-11-28 MED ORDER — VENLAFAXINE HCL ER 75 MG PO CP24
150.0000 mg | ORAL_CAPSULE | Freq: Every day | ORAL | Status: DC
  Administered 2022-11-29 – 2022-12-05 (×7): 150 mg via ORAL
  Filled 2022-11-28: qty 2
  Filled 2022-11-28 (×2): qty 1
  Filled 2022-11-28 (×5): qty 2

## 2022-11-28 NOTE — ED Triage Notes (Signed)
Pt from home for Difficulty of breathing since 11/07. Patient is normally on 2L [place] at home at night only. Patient having wheezing. EMS gave 1 Dou-neb, 2 Albuterol treatments, and 125mg  of Solumedrol. Patient has an inhaler at home that she states "has not been working". EDP at bedside. Patient was 80% on room air with EMD. Patient Tachy with EMS.

## 2022-11-28 NOTE — ED Provider Notes (Signed)
[facility] Provider Note    Event Date/Time   First MD Initiated Contact with Patient 11/28/22 1629     (approximate)   History   Chief Complaint: Shortness of Breath   HPI  Sheri Barron is a 67 y.o. female with a history of COPD who uses 2 L nasal cannula at night only who comes ED complaining of worsening shortness of breath and cough for the past week.  EMS report room air oxygen saturation was 80% on their arrival.  They gave 125 mg Solu-Medrol, DuoNeb, 2 albuterol neb, with some improvement.  Patient denies chest pain or abdominal pain, does endorse urinary frequency and dysuria over the past week.  No vomiting or diarrhea or fever.          Physical Exam   Triage Vital Signs: ED Triage Vitals [11/28/22 1631]  Encounter Vitals Group     BP (!) 133/120     Systolic BP Percentile      Diastolic BP Percentile      Pulse Rate (!) 125     Resp (!) 30     Temp 98.3 F (36.8 C)     Temp Source Oral     SpO2 98 %     Weight      Height      Head Circumference      Peak Flow      Pain Score      Pain Loc      Pain Education      Exclude from Growth Chart     Most recent vital signs: Vitals:   11/28/22 1700 11/28/22 1748  BP:  (!) 128/115  Pulse: (!) 118 (!) 116  Resp: (!) 32 (!) 22  Temp:  98.4 F (36.9 C)  SpO2: 100% 100%    General: Awake, mild respiratory distress. CV:  Good peripheral perfusion.  Tachycardia heart rate 120 Resp:  Tachypnea, respiratory rate 25.  Diffuse expiratory wheezing, prolonged expiratory phase. Abd:  No distention.  Soft nontender.  Right side CVA tenderness Other:  No lower extremity edema   ED Results / Procedures / Treatments   Labs (all labs ordered are listed, but only abnormal results are displayed) Labs Reviewed  LACTIC ACID, PLASMA - Abnormal; Notable for the following components:      Result Value   Lactic Acid, Venous 2.4 (*)    All other components within normal limits   COMPREHENSIVE METABOLIC PANEL - Abnormal; Notable for the following components:   Chloride 94 (*)    CO2 34 (*)    Glucose, Bld 102 (*)    Creatinine, Ser 1.01 (*)    Calcium 8.7 (*)    All other components within normal limits  CBC WITH DIFFERENTIAL/PLATELET - Abnormal; Notable for the following components:   Hemoglobin 11.9 (*)    All other components within normal limits  BLOOD GAS, VENOUS - Abnormal; Notable for the following components:   pCO2, Ven 90 (*)    Bicarbonate 43.3 (*)    Acid-Base Excess 12.6 (*)    All other components within normal limits  URINALYSIS, W/ REFLEX TO CULTURE (INFECTION SUSPECTED) - Abnormal; Notable for the following components:   Color, Urine YELLOW (*)    APPearance CLEAR (*)    Specific Gravity, Urine 1.039 (*)    Ketones, ur 20 (*)    All other components within normal limits  CULTURE, BLOOD (ROUTINE X 2)  CULTURE, BLOOD (ROUTINE X 2)  RESP  PANEL BY RT-PCR (RSV, FLU A&B, COVID)  RVPGX2  EXPECTORATED SPUTUM ASSESSMENT W GRAM STAIN, RFLX TO RESP C  MAGNESIUM  PROCALCITONIN  LACTIC ACID, PLASMA  HEMOGLOBIN A1C  STREP PNEUMONIAE URINARY ANTIGEN  LEGIONELLA PNEUMOPHILA SEROGP 1 UR AG  BASIC METABOLIC PANEL  CBC  HIV ANTIBODY (ROUTINE TESTING W REFLEX)     EKG Interpreted by me Sinus tachycardia rate 122.  Normal axis intervals QRS ST segments and T waves   RADIOLOGY Chest x-ray interpreted by me, no edema infiltrate or effusion.  Radiology report reviewed   PROCEDURES:  .Critical Care  Performed by: Sharman Cheek, MD Authorized by: Sharman Cheek, MD   Critical care provider statement:    Critical care time (minutes):  35   Critical care time was exclusive of:  Separately billable procedures and treating other patients   Critical care was necessary to treat or prevent imminent or life-threatening deterioration of the following conditions:  Respiratory failure and sepsis   Critical care was time spent personally by me on  the following activities:  Development of treatment plan with patient or surrogate, discussions with consultants, evaluation of patient's response to treatment, examination of patient, obtaining history from patient or surrogate, ordering and performing treatments and interventions, ordering and review of laboratory studies, ordering and review of radiographic studies, pulse oximetry, re-evaluation of patient's condition and review of old charts   Care discussed with: admitting provider      MEDICATIONS ORDERED IN ED: Medications  cefTRIAXone (ROCEPHIN) 2 g in sodium chloride 0.9 % 100 mL IVPB (0 g Intravenous Stopped 11/28/22 1927)  azithromycin (ZITHROMAX) 500 mg in dextrose 5 % 250 mL IVPB (500 mg Intravenous New Bag/Given 11/28/22 1927)  nicotine (NICODERM CQ - dosed in mg/24 hours) patch 21 mg (21 mg Transdermal Patch Applied 11/28/22 1921)  ipratropium-albuterol (DUONEB) 0.5-2.5 (3) MG/3ML nebulizer solution 3 mL (3 mLs Nebulization Given 11/28/22 1937)  albuterol (PROVENTIL) (2.5 MG/3ML) 0.083% nebulizer solution 2.5 mg (has no administration in time range)  dextromethorphan-guaiFENesin (MUCINEX DM) 30-600 MG per 12 hr tablet 1 tablet (has no administration in time range)  ondansetron (ZOFRAN) injection 4 mg (has no administration in time range)  hydrALAZINE (APRESOLINE) injection 5 mg (has no administration in time range)  acetaminophen (TYLENOL) tablet 650 mg (has no administration in time range)  0.9 %  sodium chloride infusion (has no administration in time range)  insulin aspart (novoLOG) injection 0-9 Units (has no administration in time range)  insulin aspart (novoLOG) injection 0-5 Units (has no administration in time range)  enoxaparin (LOVENOX) injection 40 mg (has no administration in time range)  sodium chloride 0.9 % bolus 1,000 mL (0 mLs Intravenous Stopped 11/28/22 1855)  ipratropium-albuterol (DUONEB) 0.5-2.5 (3) MG/3ML nebulizer solution 3 mL (3 mLs Nebulization Given  11/28/22 1641)  iohexol (OMNIPAQUE) 350 MG/ML injection 75 mL (75 mLs Intravenous Contrast Given 11/28/22 1804)  sodium chloride 0.9 % bolus 500 mL (500 mLs Intravenous New Bag/Given 11/28/22 1927)     IMPRESSION / MDM / ASSESSMENT AND PLAN / ED COURSE  I reviewed the triage vital signs and the nursing notes.  DDx: COPD exacerbation, pneumonia, pleural effusion, less likely pulmonary edema.  UTI, doubt sepsis  Patient's presentation is most consistent with acute presentation with potential threat to life or bodily function.  Patient presents with shortness of breath and cough, has wheezing and exam consistent with COPD exacerbation.  Will check chest x-ray, labs.  Doubt sepsis, will obtain screening labs.  Will give additional  IV magnesium and bronchodilators   Clinical Course as of 11/28/22 2032  Wynelle Link Nov 28, 2022  1704 Acute on chronic resp acidosis. Will start bipap [PS]    Clinical Course User Index [PS] Sharman Cheek, MD    ----------------------------------------- 8:34 PM on 11/28/2022 ----------------------------------------- CT angiogram chest obtained which is negative for PE but does show area of pneumonia.  Case was discussed with hospitalist for further management   FINAL CLINICAL IMPRESSION(S) / ED DIAGNOSES   Final diagnoses:  COPD exacerbation (HCC)  Community acquired pneumonia of right lung, unspecified part of lung     Rx / DC Orders   ED Discharge Orders     None        Note:  This document was prepared using Dragon voice recognition software and may include unintentional dictation errors.   Sharman Cheek, MD 11/28/22 2034

## 2022-11-28 NOTE — H&P (Signed)
History and Physical    Sheri Barron VWU:[id number] DOB: 05-07-1955 DOA: 11/28/2022  Referring MD/NP/PA:   PCP: Abram Sander, MD   Patient coming from:  The patient is coming from home.     Chief Complaint: SOB  HPI: Sheri Barron is a 67 y.o. female with medical history significant of asthma, COPD on 2 L oxygen at night, HTN, HLD, stroke, depression with anxiety, chronic constipation, tobacco abuse, who presents with shortness of breath.  Patient states that she has shortness of breath for more than 10 days, which has been progressively worsening.  Patient has dry cough and wheezing.  Denies chest pain.  No fever or chills.  Patient is constipated chronically. She has nausea, no vomiting or abdominal pain.  Patient reports symptoms of UTI, including dysuria, burning on urination and increased urinary frequency.  No fever or chills.  Patient is using 2 L oxygen only at night at home.  Patient is found to have severe respiratory distress, oxygen desaturation to 80s% on room air, initially started on 5 L oxygen, still has respiratory distress, cannot speak in full sentence.  BiPAP is started in ED.  Data reviewed independently and ED Course: pt was found to have WBC 8.4, GFR> 60, negative urinalysis, negative PCR for COVID, flu and RSV, temperature normal, blood pressure 128/115, heart rate 125, RR 32.  VBG with pH 7.29, CO2 90.  Chest x-ray negative.  CTA is negative for PE but showed possible bronchopneumonia.  Patient is admitted to PCU as inpatient.   CTA: 1. No evidence of pulmonary embolus. 2. Bilateral bronchial wall thickening with scattered tree in bud ground-glass airspace opacities, consistent with bronchitis and mild bilateral bronchopneumonia. 3.  Emphysema (ICD10-J43.9).  EKG: I have personally reviewed.  Sinus rhythm, regular, QTc 443, possible left atrial enlargement.   Review of Systems:   General: no fevers, chills, no body weight gain,  has fatigue HEENT:  no blurry vision, hearing changes or sore throat Respiratory: has dyspnea, coughing, wheezing CV: no chest pain, no palpitations GI: has nausea, constipation, no vomiting, abdominal pain, diarrhea,  GU: no dysuria, burning on urination, increased urinary frequency, hematuria  Ext: no leg edema Neuro: no unilateral weakness, numbness, or tingling, no vision change or hearing loss Skin: no rash, no skin tear. MSK: No muscle spasm, no deformity, no limitation of range of movement in spin Heme: No easy bruising.  Travel history: No recent long distant travel.   Allergy:  Allergies  Allergen Reactions   Sulfa Antibiotics Nausea Only    Patient states she gets dehydrated with sulfa as well.    Past Medical History:  Diagnosis Date   Anxiety    Asthma    Chronic constipation    COPD (chronic obstructive pulmonary disease) (HCC)    Depression    Diabetes mellitus without complication (HCC)    GERD (gastroesophageal reflux disease)    Hypertension    Stroke [facility])     Past Surgical History:  Procedure Laterality Date   ABDOMINAL HYSTERECTOMY     APPENDECTOMY     CHOLECYSTECTOMY     COLONOSCOPY WITH PROPOFOL N/A 09/04/2014   Procedure: COLONOSCOPY WITH PROPOFOL;  Surgeon: Scot Jun, MD;  Location: [facility] ENDOSCOPY;  Service: Endoscopy;  Laterality: N/A;   COLONOSCOPY WITH PROPOFOL N/A 10/21/2017   Procedure: COLONOSCOPY WITH PROPOFOL;  Surgeon: Scot Jun, MD;  Location: [facility] ENDOSCOPY;  Service: Endoscopy;  Laterality: N/A;   EGD-colonsocopy     ESOPHAGOGASTRODUODENOSCOPY (EGD) WITH  PROPOFOL N/A 09/04/2014   Procedure: ESOPHAGOGASTRODUODENOSCOPY (EGD) WITH PROPOFOL;  Surgeon: Scot Jun, MD;  Location: [facility] ENDOSCOPY;  Service: Endoscopy;  Laterality: N/A;   SAVORY DILATION N/A 09/04/2014   Procedure: SAVORY DILATION;  Surgeon: Scot Jun, MD;  Location: [facility] ENDOSCOPY;  Service: Endoscopy;  Laterality: N/A;    Social History:  reports that she has been  smoking cigarettes. She has never used smokeless tobacco. She reports current drug use. Drug: Marijuana. She reports that she does not drink alcohol.  Family History:  Family History  Problem Relation Age of Onset   Breast cancer Neg Hx      Prior to Admission medications   Medication Sig Start Date End Date Taking? Authorizing Provider  albuterol (PROVENTIL HFA;VENTOLIN HFA) 108 (90 BASE) MCG/ACT inhaler Inhale 2 puffs into the lungs every 6 (six) hours as needed for wheezing or shortness of breath. 12/06/14   Minna Antis, MD  aspirin 325 MG tablet Take 325 mg by mouth daily.    [provider]  azelastine (ASTELIN) 0.1 % nasal spray Place 2 sprays into both nostrils 2 (two) times daily. Use in each nostril as directed     [provider]  beclomethasone (QVAR) 80 MCG/ACT inhaler Inhale 2 puffs into the lungs 2 (two) times daily.     [provider]  fluticasone (FLONASE) 50 MCG/ACT nasal spray Place 1 spray into both nostrils 2 (two) times daily.     [provider]  gabapentin (NEURONTIN) 100 MG capsule Take 100 mg by mouth 3 (three) times daily as needed. For pain    [provider]  hydrochlorothiazide (MICROZIDE) 12.5 MG capsule Take 12.5 mg by mouth daily.    [provider]  ibuprofen (ADVIL,MOTRIN) 200 MG tablet Take 800 mg by mouth 3 (three) times daily as needed for moderate pain.    [provider]  lidocaine (LIDODERM) 5 % Place 1 patch onto the skin daily as needed. For pain    [provider]  linaclotide (LINZESS) 145 MCG CAPS capsule Take 145 mcg by mouth daily before breakfast.    [provider]  loratadine (CLARITIN) 10 MG tablet Take 10 mg by mouth daily.    [provider]  metFORMIN (GLUCOPHAGE) 500 MG tablet Take 500 mg by mouth 2 (two) times daily with a meal.    [provider]  omeprazole (PRILOSEC) 20 MG capsule Take 20 mg by mouth daily.    [provider]  polyethylene glycol (MIRALAX / GLYCOLAX) packet Take 17 g by mouth at bedtime.    [provider]  propranolol (INDERAL) 20 MG tablet Take 40 mg by mouth 2 (two) times daily.     [provider]  senna (SENOKOT) 8.6 MG TABS tablet Take 1 tablet by mouth daily.    [provider]  sucralfate (CARAFATE) 1 G tablet Take 1 g by mouth 4 (four) times daily -  before meals and at bedtime.     [provider]  tiotropium (SPIRIVA) 18 MCG inhalation capsule Place 18 mcg into inhaler and inhale daily.    [provider]  traZODone (DESYREL) 100 MG tablet Take 300 mg by mouth at bedtime.     [provider]  ursodiol (ACTIGALL) 300 MG capsule Take 300 mg by mouth 2 (two) times daily.    [provider]  venlafaxine XR (EFFEXOR-XR) 150 MG 24 hr capsule Take 150 mg by mouth daily with breakfast.    [provider]    Physical Exam: Vitals:   11/28/22 1631 11/28/22 1636 11/28/22 1700 11/28/22 1748  BP: (!) 133/120   (!) 128/115  Pulse: (!) 125  (!) 118 (!) 116  Resp: (!) 30  (!) 32 (!) 22  Temp: 98.3 F (36.8 C)   98.4 F (36.9 C)  TempSrc: Oral     SpO2: 98%  100% 100%  Weight:  61.2 kg    Height:  5' (1.524 m)     General: Has acute respiratory distress HEENT:       Eyes: PERRL, EOMI, no jaundice       ENT: No discharge from the ears and nose, no pharynx injection, no tonsillar enlargement.        Neck: No JVD, no bruit, no mass felt. Heme: No neck lymph node enlargement. Cardiac: S1/S2, RRR, No murmurs, No gallops or rubs. Respiratory: Has wheezing bilaterally GI: Soft, nondistended, nontender, no rebound pain, no organomegaly, BS present. GU: No hematuria Ext: No pitting leg edema bilaterally. 1+DP/PT pulse bilaterally. Musculoskeletal: No joint deformities, No joint redness or warmth, no limitation of ROM in spin. Skin: No rashes.  Neuro: Alert, oriented X3, cranial nerves II-XII grossly intact, moves all  extremities normally. Psych: Patient is not psychotic, no suicidal or hemocidal ideation.  Labs on Admission: I have personally reviewed following labs and imaging studies  CBC: Recent Labs  Lab 11/28/22 1630  WBC 8.4  NEUTROABS 5.4  HGB 11.9*  HCT 38.9  MCV 91.7  PLT 174   Basic Metabolic Panel: Recent Labs  Lab 11/28/22 1630  NA 139  K 4.1  CL 94*  CO2 34*  GLUCOSE 102*  BUN 22  CREATININE 1.01*  CALCIUM 8.7*  MG 1.7   GFR: Estimated Creatinine Clearance: 44.8 mL/min (A) (by C-G formula based on SCr of 1.01 mg/dL (H)). Liver Function Tests: Recent Labs  Lab 11/28/22 1630  AST 16  ALT 15  ALKPHOS 73  BILITOT 0.7  PROT 7.2  ALBUMIN 3.7   No results for input(s): "LIPASE", "AMYLASE" in the last 168 hours. No results for input(s): "AMMONIA" in the last 168 hours. Coagulation Profile: No results for input(s): "INR", "PROTIME" in the last 168 hours. Cardiac Enzymes: No results for input(s): "CKTOTAL", "CKMB", "CKMBINDEX", "TROPONINI" in the last 168 hours. BNP (last 3 results) No results for input(s): "PROBNP" in the last 8760 hours. HbA1C: No results for input(s): "HGBA1C" in the last 72 hours. CBG: No results for input(s): "GLUCAP" in the last 168 hours. Lipid Profile: No results for input(s): "CHOL", "HDL", "LDLCALC", "TRIG", "CHOLHDL", "LDLDIRECT" in the last 72 hours. Thyroid Function Tests: No results for input(s): "TSH", "T4TOTAL", "FREET4", "T3FREE", "THYROIDAB" in the last 72 hours. Anemia Panel: No results for input(s): "VITAMINB12", "FOLATE", "FERRITIN", "TIBC", "IRON", "RETICCTPCT" in the last 72 hours. Urine analysis:    Component Value Date/Time   COLORURINE YELLOW (A) 11/28/2022 1850   APPEARANCEUR CLEAR (A) 11/28/2022 1850   LABSPEC 1.039 (H) 11/28/2022 1850   PHURINE 5.0 11/28/2022 1850   GLUCOSEU NEGATIVE 11/28/2022 1850   HGBUR NEGATIVE 11/28/2022 1850   BILIRUBINUR NEGATIVE 11/28/2022 1850   KETONESUR 20 (A) 11/28/2022 1850    PROTEINUR NEGATIVE 11/28/2022 1850   NITRITE NEGATIVE 11/28/2022 1850   LEUKOCYTESUR NEGATIVE 11/28/2022 1850   Sepsis Labs: @LABRCNTIP (procalcitonin:4,lacticidven:4) ) Recent Results (from the past 240 hour(s))  Resp panel by RT-PCR (RSV, Flu A&B, Covid) Anterior Nasal Swab     Status: None   Collection Time: 11/28/22  7:55  PM   Specimen: Anterior Nasal Swab  Result Value Ref Range Status   SARS Coronavirus 2 by RT PCR NEGATIVE NEGATIVE Final    Comment: (NOTE) SARS-CoV-2 target nucleic acids are NOT DETECTED.  The SARS-CoV-2 RNA is generally detectable in upper respiratory specimens during the acute phase of infection. The lowest concentration of SARS-CoV-2 viral copies this assay can detect is 138 copies/mL. A negative result does not preclude SARS-Cov-2 infection and should not be used as the sole basis for treatment or other patient management decisions. A negative result may occur with  improper specimen collection/handling, submission of specimen other than nasopharyngeal swab, presence of viral mutation(s) within the areas targeted by this assay, and inadequate number of viral copies(<138 copies/mL). A negative result must be combined with clinical observations, patient history, and epidemiological information. The expected result is Negative.  Fact Sheet for Patients:  BloggerCourse.com  Fact Sheet for Healthcare Providers:  SeriousBroker.it  This test is no t yet approved or cleared by the [place] FDA and  has been authorized for detection and/or diagnosis of SARS-CoV-2 by FDA under an Emergency Use Authorization (EUA). This EUA will remain  in effect (meaning this test can be used) for the duration of the COVID-19 declaration under Section 564(b)(1) of the Act, 21 U.S.C.section 360bbb-3(b)(1), unless the authorization is terminated  or revoked sooner.       Influenza A by PCR NEGATIVE NEGATIVE Final    Influenza B by PCR NEGATIVE NEGATIVE Final    Comment: (NOTE) The Xpert Xpress SARS-CoV-2/FLU/RSV plus assay is intended as an aid in the diagnosis of influenza from Nasopharyngeal swab specimens and should not be used as a sole basis for treatment. Nasal washings and aspirates are unacceptable for Xpert Xpress SARS-CoV-2/FLU/RSV testing.  Fact Sheet for Patients: BloggerCourse.com  Fact Sheet for Healthcare Providers: SeriousBroker.it  This test is not yet approved or cleared by the [place] FDA and has been authorized for detection and/or diagnosis of SARS-CoV-2 by FDA under an Emergency Use Authorization (EUA). This EUA will remain in effect (meaning this test can be used) for the duration of the COVID-19 declaration under Section 564(b)(1) of the Act, 21 U.S.C. section 360bbb-3(b)(1), unless the authorization is terminated or revoked.     Resp Syncytial Virus by PCR NEGATIVE NEGATIVE Final    Comment: (NOTE) Fact Sheet for Patients: BloggerCourse.com  Fact Sheet for Healthcare Providers: SeriousBroker.it  This test is not yet approved or cleared by the [place] FDA and has been authorized for detection and/or diagnosis of SARS-CoV-2 by FDA under an Emergency Use Authorization (EUA). This EUA will remain in effect (meaning this test can be used) for the duration of the COVID-19 declaration under Section 564(b)(1) of the Act, 21 U.S.C. section 360bbb-3(b)(1), unless the authorization is terminated or revoked.  Performed at [facility], [address] [place], [place] [address]      Radiological Exams on Admission: CT Angio Chest PE W and/or Wo Contrast  Result Date: 11/28/2022 CLINICAL DATA:  Difficulty breathing for 10 days, wheezing, hypoxia, tachycardia EXAM: CT ANGIOGRAPHY CHEST WITH CONTRAST TECHNIQUE: Multidetector CT imaging of the chest was  performed using the standard protocol during bolus administration of intravenous contrast. Multiplanar CT image reconstructions and MIPs were obtained to evaluate the vascular anatomy. RADIATION DOSE REDUCTION: This exam was performed according to the departmental dose-optimization program which includes automated exposure control, adjustment of the mA and/or kV according to patient size and/or use of iterative reconstruction technique. CONTRAST:  75mL OMNIPAQUE IOHEXOL 350 MG/ML SOLN COMPARISON:  11/28/2022, 04/05/2016 FINDINGS: Cardiovascular: This is a technically adequate evaluation of the pulmonary vasculature. No filling defects or pulmonary emboli. The heart is unremarkable without pericardial effusion. No evidence of thoracic aortic aneurysm or dissection. Mediastinum/Nodes: No enlarged mediastinal, hilar, or axillary lymph nodes. Thyroid gland, trachea, and esophagus demonstrate no significant findings. Lungs/Pleura: Upper lobe predominant emphysema. There is bilateral bronchial wall thickening, with scattered tree in bud ground-glass airspace disease bilaterally, most pronounced in the left upper and right lower lobes. No other dense consolidation, effusion, or pneumothorax. The central airways are patent. Upper Abdomen: No acute abnormality. Musculoskeletal: No acute or destructive bony abnormalities. Reconstructed images demonstrate no additional findings. Review of the MIP images confirms the above findings. IMPRESSION: 1. No evidence of pulmonary embolus. 2. Bilateral bronchial wall thickening with scattered tree in bud ground-glass airspace opacities, consistent with bronchitis and mild bilateral bronchopneumonia. 3.  Emphysema (ICD10-J43.9). Electronically Signed   By: Sharlet Salina M.D.   On: 11/28/2022 18:22   DG Chest Port 1 View  Result Date: 11/28/2022 CLINICAL DATA:  Questionable sepsis. Evaluate for abnormality. Shortness of breath. EXAM: PORTABLE CHEST 1 VIEW COMPARISON:  Two-view chest  x-ray 11/14/2015.  CT chest 04/05/2016. FINDINGS: The heart size and mediastinal contours are within normal limits. Both lungs are clear. The visualized skeletal structures are unremarkable. IMPRESSION: Negative one-view chest x-ray Electronically Signed   By: Marin Roberts M.D.   On: 11/28/2022 16:56      Assessment/Plan Principal Problem:   COPD exacerbation (HCC) Active Problems:   Acute on chronic respiratory failure with hypoxia and hypercapnia (HCC)   CAP (community acquired pneumonia)   Severe sepsis (HCC)   HTN (hypertension)   Diabetes mellitus without complication (HCC)   Stroke (HCC)   Depression with anxiety   Tobacco abuse   Assessment and Plan:  Acute on chronic respiratory failure with hypoxia and hypercapnia and severe sepsis due to COPD exacerbation and possible CAP (community acquired pneumonia): CTA negative for PE, but showed bilateral bronchial wall thickening with scattered tree in bud ground-glass airspace opacities, consistent with bronchitis and mild bilateral bronchopneumonia. Pt meets criteria for severe sepsis with heart rate of 125, RR 32.  Lactic acid 2.4.   - Will admit to PCU as inpt - pt is on BiPAP, will try to wean off BiPAP - IV Rocephin and azithromycin - Incentive spirometry - Solu-Medrol 125 mg, then 80 mg twice daily - Mucinex for cough  - Bronchodilators - Urine legionella and S. pneumococcal antigen - Follow up blood culture x2, sputum culture - will get Procalcitonin and trend lactic acid level per sepsis protocol - IVF: 1.5L of NS bolus in ED, followed by 75 mL per hour of NS  HTN (hypertension): -IV hydralazine as needed -Hold HCTZ since patient may need IV fluid  Diabetes mellitus without complication (HCC): Recent A1c 6.1, well-controlled.  Patient is taking metformin at home -Sliding scale insulin  History of stroke (HCC) -Aspirin  Depression with anxiety -Continue home medications  Tobacco abuse -Did counseling  about importance of quitting smoking -Nicotine patch       DVT ppx: SQ Lovenox  Code Status: Full code   Family Communication: not done, no family member is at bed side.     Disposition Plan:  Anticipate discharge back to previous environment  Consults called:  none  Admission status and Level of care: Progressive:     as inpt       Dispo:  The patient is from: Home              Anticipated d/c is to: Home              Anticipated d/c date is: 2 days              Patient currently is not medically stable to d/c.    Severity of Illness:  The appropriate patient status for this patient is INPATIENT. Inpatient status is judged to be reasonable and necessary in order to provide the required intensity of service to ensure the patient's safety. The patient's presenting symptoms, physical exam findings, and initial radiographic and laboratory data in the context of their chronic comorbidities is felt to place them at high risk for further clinical deterioration. Furthermore, it is not anticipated that the patient will be medically stable for discharge from the hospital within 2 midnights of admission.   * I certify that at the point of admission it is my clinical judgment that the patient will require inpatient hospital care spanning beyond 2 midnights from the point of admission due to high intensity of service, high risk for further deterioration and high frequency of surveillance required.*       Date of Service 11/28/2022    Lorretta Harp [facility]   If 7PM-7AM, please contact night-coverage [email or web address] 11/28/2022, 9:11 PM

## 2022-11-29 ENCOUNTER — Encounter: Payer: Self-pay | Admitting: *Deleted

## 2022-11-29 ENCOUNTER — Encounter: Payer: Self-pay | Admitting: Internal Medicine

## 2022-11-29 DIAGNOSIS — J441 Chronic obstructive pulmonary disease with (acute) exacerbation: Secondary | ICD-10-CM | POA: Diagnosis not present

## 2022-11-29 LAB — LACTIC ACID, PLASMA
Lactic Acid, Venous: 1.2 mmol/L (ref 0.5–1.9)
Lactic Acid, Venous: 0.8 mmol/L (ref 0.5–1.9)

## 2022-11-29 LAB — HEMOGLOBIN A1C
Hgb A1c MFr Bld: 6.2 % — ABNORMAL HIGH (ref 4.8–5.6)
Mean Plasma Glucose: 131 mg/dL

## 2022-11-29 LAB — CBC
HCT: 34.1 % — ABNORMAL LOW (ref 36.0–46.0)
Hemoglobin: 10.5 g/dL — ABNORMAL LOW (ref 12.0–15.0)
MCH: 28.6 pg (ref 26.0–34.0)
MCHC: 30.8 g/dL (ref 30.0–36.0)
MCV: 92.9 fL (ref 80.0–100.0)
Platelets: 168 10*3/uL (ref 150–400)
RBC: 3.67 MIL/uL — ABNORMAL LOW (ref 3.87–5.11)
RDW: 14.1 % (ref 11.5–15.5)
WBC: 4.3 10*3/uL (ref 4.0–10.5)
nRBC: 0 % (ref 0.0–0.2)

## 2022-11-29 LAB — BASIC METABOLIC PANEL
Anion gap: 9 (ref 5–15)
BUN: 21 mg/dL (ref 8–23)
CO2: 31 mmol/L (ref 22–32)
Calcium: 8 mg/dL — ABNORMAL LOW (ref 8.9–10.3)
Chloride: 97 mmol/L — ABNORMAL LOW (ref 98–111)
Creatinine, Ser: 0.95 mg/dL (ref 0.44–1.00)
GFR, Estimated: >60 mL/min (ref >60)
Glucose, Bld: 156 mg/dL — ABNORMAL HIGH (ref 70–99)
Potassium: 4.2 mmol/L (ref 3.5–5.1)
Sodium: 137 mmol/L (ref 135–145)

## 2022-11-29 LAB — BLOOD GAS, VENOUS
Acid-Base Excess: 12.6 mmol/L — ABNORMAL HIGH (ref 0.0–2.0)
Bicarbonate: 43.3 mmol/L — ABNORMAL HIGH (ref 20.0–28.0)
Patient temperature: 37
pCO2, Ven: 90 mmHg (ref 44–60)
pH, Ven: 7.29 (ref 7.25–7.43)

## 2022-11-29 LAB — RESPIRATORY PANEL BY PCR

## 2022-11-29 LAB — CBG MONITORING, ED
Glucose-Capillary: 126 mg/dL — ABNORMAL HIGH (ref 70–99)
Glucose-Capillary: 138 mg/dL — ABNORMAL HIGH (ref 70–99)
Glucose-Capillary: 173 mg/dL — ABNORMAL HIGH (ref 70–99)
Glucose-Capillary: 177 mg/dL — ABNORMAL HIGH (ref 70–99)

## 2022-11-29 LAB — GLUCOSE, CAPILLARY: Glucose-Capillary: 103 mg/dL — ABNORMAL HIGH (ref 70–99)

## 2022-11-29 LAB — EXPECTORATED SPUTUM ASSESSMENT W GRAM STAIN, RFLX TO RESP C

## 2022-11-29 LAB — HIV ANTIBODY (ROUTINE TESTING W REFLEX): HIV Screen 4th Generation wRfx: NONREACTIVE

## 2022-11-29 LAB — STREP PNEUMONIAE URINARY ANTIGEN: Strep Pneumo Urinary Antigen: NEGATIVE

## 2022-11-29 MED ORDER — AZITHROMYCIN 250 MG PO TABS
500.0000 mg | ORAL_TABLET | Freq: Every day | ORAL | Status: DC
  Administered 2022-11-29: 500 mg via ORAL
  Filled 2022-11-29: qty 2

## 2022-11-29 MED ORDER — ALPRAZOLAM 0.5 MG PO TABS
0.5000 mg | ORAL_TABLET | Freq: Once | ORAL | Status: AC
  Administered 2022-11-29: 0.5 mg via ORAL
  Filled 2022-11-29: qty 1

## 2022-11-29 MED ORDER — INFLUENZA VAC A&B SURF ANT ADJ 0.5 ML IM SUSY
0.5000 mL | PREFILLED_SYRINGE | INTRAMUSCULAR | Status: DC
  Filled 2022-11-29: qty 0.5

## 2022-11-29 MED ORDER — GABAPENTIN 100 MG PO CAPS
100.0000 mg | ORAL_CAPSULE | Freq: Three times a day (TID) | ORAL | Status: DC | PRN
  Administered 2022-12-03: 100 mg via ORAL
  Filled 2022-11-29: qty 1

## 2022-11-29 MED ORDER — HYDROXYZINE HCL 10 MG PO TABS
10.0000 mg | ORAL_TABLET | Freq: Once | ORAL | Status: AC
  Administered 2022-11-30: 10 mg via ORAL
  Filled 2022-11-29: qty 1

## 2022-11-29 MED ORDER — ALBUTEROL SULFATE (2.5 MG/3ML) 0.083% IN NEBU
2.5000 mg | INHALATION_SOLUTION | RESPIRATORY_TRACT | Status: DC | PRN
  Administered 2022-11-29: 2.5 mg via RESPIRATORY_TRACT
  Filled 2022-11-29: qty 3

## 2022-11-29 MED ORDER — IPRATROPIUM-ALBUTEROL 0.5-2.5 (3) MG/3ML IN SOLN
3.0000 mL | Freq: Four times a day (QID) | RESPIRATORY_TRACT | Status: DC
  Administered 2022-11-29 – 2022-11-30 (×4): 3 mL via RESPIRATORY_TRACT
  Filled 2022-11-29 (×4): qty 3

## 2022-11-29 MED ORDER — BACLOFEN 10 MG PO TABS
10.0000 mg | ORAL_TABLET | Freq: Three times a day (TID) | ORAL | Status: DC
  Administered 2022-11-29 – 2022-12-05 (×18): 10 mg via ORAL
  Filled 2022-11-29 (×20): qty 1

## 2022-11-29 MED ORDER — METHYLPREDNISOLONE SODIUM SUCC 40 MG IJ SOLR
40.0000 mg | Freq: Two times a day (BID) | INTRAMUSCULAR | Status: DC
Start: 2022-11-29 — End: 2022-12-01
  Administered 2022-11-29 – 2022-12-01 (×4): 40 mg via INTRAVENOUS
  Filled 2022-11-29 (×4): qty 1

## 2022-11-29 MED ORDER — ASPIRIN 81 MG PO TBEC
81.0000 mg | DELAYED_RELEASE_TABLET | Freq: Every day | ORAL | Status: DC
  Administered 2022-11-29 – 2022-12-05 (×7): 81 mg via ORAL
  Filled 2022-11-29 (×7): qty 1

## 2022-11-29 NOTE — Plan of Care (Signed)
  Problem: Education: Goal: Ability to describe self-care measures that may prevent or decrease complications (Diabetes Survival Skills Education) will improve Outcome: Progressing   Problem: Respiratory: Goal: Ability to maintain a clear airway will improve Outcome: Progressing   Problem: Respiratory: Goal: Levels of oxygenation will improve Outcome: Progressing   Problem: Respiratory: Goal: Ability to maintain adequate ventilation will improve Outcome: Progressing   Problem: Clinical Measurements: Goal: Ability to maintain a body temperature in the normal range will improve Outcome: Progressing

## 2022-11-29 NOTE — ED Notes (Signed)
ED TO INPATIENT HANDOFF REPORT  ED Nurse Name and Phone #: Al Corpus, RN [phone]  S Name/Age/Gender Sheri Barron 67 y.o. female Room/Bed: ED09A/ED09A  Code Status   Code Status: Limited: Do not attempt resuscitation (DNR) -DNR-LIMITED -Do Not Intubate/DNI   Home/SNF/Other Home Patient oriented to: self, place, time, and situation Is this baseline? Yes   Triage Complete: Triage complete  Chief Complaint COPD exacerbation [facility]) [J44.1]  Triage Note Pt from home for Difficulty of breathing since 11/07. Patient is normally on 2L [place] at home at night only. Patient having wheezing. EMS gave 1 Dou-neb, 2 Albuterol treatments, and 125mg  of Solumedrol. Patient has an inhaler at home that she states "has not been working". EDP at bedside. Patient was 80% on room air with EMD. Patient Tachy with EMS.      Allergies Allergies  Allergen Reactions   Sulfa Antibiotics Nausea Only    Patient states she gets dehydrated with sulfa as well.    Level of Care/Admitting Diagnosis ED Disposition     ED Disposition  Admit   Condition  --   Comment  Hospital Area: [facility] [[id number]]  Level of Care: Progressive [102]  Admit to Progressive based on following criteria: RESPIRATORY PROBLEMS hypoxemic/hypercapnic respiratory failure that is responsive to NIPPV (BiPAP) or High Flow Nasal Cannula (6-80 lpm). Frequent assessment/intervention, no > Q2 hrs < Q4 hrs, to maintain oxygenation and pulmonary hygiene.  Covid Evaluation: Symptomatic Person Under Investigation (PUI) or recent exposure (last 10 days) *Testing Required*  Diagnosis: COPD exacerbation [facility]) [[id number]]  Admitting Physician: Lorretta Harp [[id number]]  Attending Physician: Lorretta Harp [phone]  Certification:: I certify this patient will need inpatient services for at least 2 midnights          B Medical/Surgery History Past Medical History:  Diagnosis Date   Anxiety    Asthma    Chronic constipation     COPD (chronic obstructive pulmonary disease) (HCC)    Depression    Diabetes mellitus without complication (HCC)    GERD (gastroesophageal reflux disease)    Hypertension    Stroke [facility])    Past Surgical History:  Procedure Laterality Date   ABDOMINAL HYSTERECTOMY     APPENDECTOMY     CHOLECYSTECTOMY     COLONOSCOPY WITH PROPOFOL N/A 09/04/2014   Procedure: COLONOSCOPY WITH PROPOFOL;  Surgeon: Scot Jun, MD;  Location: [facility] ENDOSCOPY;  Service: Endoscopy;  Laterality: N/A;   COLONOSCOPY WITH PROPOFOL N/A 10/21/2017   Procedure: COLONOSCOPY WITH PROPOFOL;  Surgeon: Scot Jun, MD;  Location: [facility] ENDOSCOPY;  Service: Endoscopy;  Laterality: N/A;   EGD-colonsocopy     ESOPHAGOGASTRODUODENOSCOPY (EGD) WITH PROPOFOL N/A 09/04/2014   Procedure: ESOPHAGOGASTRODUODENOSCOPY (EGD) WITH PROPOFOL;  Surgeon: Scot Jun, MD;  Location: [facility] ENDOSCOPY;  Service: Endoscopy;  Laterality: N/A;   SAVORY DILATION N/A 09/04/2014   Procedure: SAVORY DILATION;  Surgeon: Scot Jun, MD;  Location: [facility] ENDOSCOPY;  Service: Endoscopy;  Laterality: N/A;     A IV Location/Drains/Wounds Patient Lines/Drains/Airways Status     Active Line/Drains/Airways     Name Placement date Placement time Site Days   Peripheral IV 11/28/22 20 G 1" Anterior;Left Forearm 11/28/22  1556  Forearm  1   Peripheral IV 11/28/22 20 G Left Antecubital 11/28/22  1644  Antecubital  1   Peripheral IV 11/28/22 22 G 1" Right Antecubital 11/28/22  1651  Antecubital  1   Airway 09/04/14  1506  -- 3008  Intake/Output Last 24 hours  Intake/Output Summary (Last 24 hours) at 11/29/2022 1748 Last data filed at 11/29/2022 1314 Gross per 24 hour  Intake 2858.91 ml  Output 1 ml  Net 2857.91 ml    Labs/Imaging Results for orders placed or performed during the hospital encounter of 11/28/22 (from the past 48 hour(s))  Comprehensive metabolic panel     Status: Abnormal   Collection Time: 11/28/22   4:30 PM  Result Value Ref Range   Sodium 139 135 - 145 mmol/L   Potassium 4.1 3.5 - 5.1 mmol/L   Chloride 94 (L) 98 - 111 mmol/L   CO2 34 (H) 22 - 32 mmol/L   Glucose, Bld 102 (H) 70 - 99 mg/dL    Comment: Glucose reference range applies only to samples taken after fasting for at least 8 hours.   BUN 22 8 - 23 mg/dL   Creatinine, Ser 6.06 (H) 0.44 - 1.00 mg/dL   Calcium 8.7 (L) 8.9 - 10.3 mg/dL   Total Protein 7.2 6.5 - 8.1 g/dL   Albumin 3.7 3.5 - 5.0 g/dL   AST 16 15 - 41 U/L   ALT 15 0 - 44 U/L   Alkaline Phosphatase 73 38 - 126 U/L   Total Bilirubin 0.7 <1.2 mg/dL   GFR, Estimated >30 >16 mL/min    Comment: (NOTE) Calculated using the CKD-EPI Creatinine Equation (2021)    Anion gap 11 5 - 15    Comment: Performed at [facility], [address] [place], [place] [address]  CBC with Differential     Status: Abnormal   Collection Time: 11/28/22  4:30 PM  Result Value Ref Range   WBC 8.4 4.0 - 10.5 K/uL   RBC 4.24 3.87 - 5.11 MIL/uL   Hemoglobin 11.9 (L) 12.0 - 15.0 g/dL   HCT 23.5 57.3 - 22.0 %   MCV 91.7 80.0 - 100.0 fL   MCH 28.1 26.0 - 34.0 pg   MCHC 30.6 30.0 - 36.0 g/dL   RDW 25.4 27.0 - 62.3 %   Platelets 174 150 - 400 K/uL   nRBC 0.0 0.0 - 0.2 %   Neutrophils Relative % 64 %   Neutro Abs 5.4 1.7 - 7.7 K/uL   Lymphocytes Relative 29 %   Lymphs Abs 2.5 0.7 - 4.0 K/uL   Monocytes Relative 7 %   Monocytes Absolute 0.6 0.1 - 1.0 K/uL   Eosinophils Relative 0 %   Eosinophils Absolute 0.0 0.0 - 0.5 K/uL   Basophils Relative 0 %   Basophils Absolute 0.0 0.0 - 0.1 K/uL   Immature Granulocytes 0 %   Abs Immature Granulocytes 0.03 0.00 - 0.07 K/uL    Comment: Performed at [facility], [address] [place], [place] [address]  Magnesium     Status: None   Collection Time: 11/28/22  4:30 PM  Result Value Ref Range   Magnesium 1.7 1.7 - 2.4 mg/dL    Comment: Performed at [facility], [address] [place], [place] [address]  Blood  gas, venous     Status: Abnormal   Collection Time: 11/28/22  4:31 PM  Result Value Ref Range   pH, Ven 7.29 7.25 - 7.43   pCO2, Ven 90 (HH) 44 - 60 mmHg    Comment: CRITICAL RESULT CALLED TO, READ BACK BY AND VERIFIED WITH: Clayborn Bigness RN AT 1659 BY N ELINSKI RRT ON [id number]    Bicarbonate 43.3 (H) 20.0 - 28.0 mmol/L   Acid-Base  Excess 12.6 (H) 0.0 - 2.0 mmol/L   Patient temperature 37.0    Collection site VENOUS     Comment: Performed at [facility], [address] [place], [place] [address]  Lactic acid, plasma     Status: Abnormal   Collection Time: 11/28/22  4:32 PM  Result Value Ref Range   Lactic Acid, Venous 2.4 (HH) 0.5 - 1.9 mmol/L    Comment: CRITICAL RESULT CALLED TO, READ BACK BY AND VERIFIED WITH Nash Dimmer NELSON 11/28/2022 AT 1724 SRR Performed at [facility], [address] [place], [place] [address]   Blood Culture (routine x 2)     Status: None (Preliminary result)   Collection Time: 11/28/22  4:32 PM   Specimen: BLOOD  Result Value Ref Range   Specimen Description BLOOD RIGHT AC    Special Requests BOTTLES DRAWN AEROBIC AND ANAEROBIC    Culture      NO GROWTH < 12 HOURS Performed at [facility], [address], [place], [place] [address]    Report Status PENDING   Blood Culture (routine x 2)     Status: None (Preliminary result)   Collection Time: 11/28/22  4:32 PM   Specimen: BLOOD  Result Value Ref Range   Specimen Description BLOOD LEFT AC    Special Requests BOTTLES DRAWN AEROBIC AND ANAEROBIC    Culture      NO GROWTH < 12 HOURS Performed at [facility], [address] [place], [place] [address]    Report Status PENDING   Procalcitonin     Status: None   Collection Time: 11/28/22  4:32 PM  Result Value Ref Range   Procalcitonin <0.10 ng/mL    Comment:        Interpretation: PCT (Procalcitonin) <= 0.5 ng/mL: Systemic infection (sepsis) is not likely. Local bacterial infection is possible. (NOTE)        Sepsis PCT Algorithm           Lower Respiratory Tract                                      Infection PCT Algorithm    ----------------------------     ----------------------------         PCT < 0.25 ng/mL                PCT < 0.10 ng/mL          Strongly encourage             Strongly discourage   discontinuation of antibiotics    initiation of antibiotics    ----------------------------     -----------------------------       PCT 0.25 - 0.50 ng/mL            PCT 0.10 - 0.25 ng/mL               OR       >80% decrease in PCT            Discourage initiation of                                            antibiotics      Encourage discontinuation           of antibiotics    ----------------------------     -----------------------------  PCT >= 0.50 ng/mL              PCT 0.26 - 0.50 ng/mL               AND        <80% decrease in PCT             Encourage initiation of                                             antibiotics       Encourage continuation           of antibiotics    ----------------------------     -----------------------------        PCT >= 0.50 ng/mL                  PCT > 0.50 ng/mL               AND         increase in PCT                  Strongly encourage                                      initiation of antibiotics    Strongly encourage escalation           of antibiotics                                     -----------------------------                                           PCT <= 0.25 ng/mL                                                 OR                                        > 80% decrease in PCT                                      Discontinue / Do not initiate                                             antibiotics  Performed at [facility], [address] [place], [place] [address]   Urinalysis, w/ Reflex to Culture (Infection Suspected) -Urine, Clean Catch     Status: Abnormal   Collection Time: 11/28/22  6:50 PM  Result  Value Ref Range   Specimen Source URINE, CLEAN CATCH    Color, Urine YELLOW (A) YELLOW   APPearance CLEAR (A)  CLEAR   Specific Gravity, Urine 1.039 (H) 1.005 - 1.030   pH 5.0 5.0 - 8.0   Glucose, UA NEGATIVE NEGATIVE mg/dL   Hgb urine dipstick NEGATIVE NEGATIVE   Bilirubin Urine NEGATIVE NEGATIVE   Ketones, ur 20 (A) NEGATIVE mg/dL   Protein, ur NEGATIVE NEGATIVE mg/dL   Nitrite NEGATIVE NEGATIVE   Leukocytes,Ua NEGATIVE NEGATIVE   RBC / HPF 0-5 0 - 5 RBC/hpf   WBC, UA 0-5 0 - 5 WBC/hpf    Comment:        Reflex urine culture not performed if WBC <=10, OR if Squamous epithelial cells >5. If Squamous epithelial cells >5 suggest recollection.    Bacteria, UA NONE SEEN NONE SEEN   Squamous Epithelial / HPF 0-5 0 - 5 /HPF    Comment: Performed at [facility], [address] [place], [place] [address]  Resp panel by RT-PCR (RSV, Flu A&B, Covid) Anterior Nasal Swab     Status: None   Collection Time: 11/28/22  7:55 PM   Specimen: Anterior Nasal Swab  Result Value Ref Range   SARS Coronavirus 2 by RT PCR NEGATIVE NEGATIVE    Comment: (NOTE) SARS-CoV-2 target nucleic acids are NOT DETECTED.  The SARS-CoV-2 RNA is generally detectable in upper respiratory specimens during the acute phase of infection. The lowest concentration of SARS-CoV-2 viral copies this assay can detect is 138 copies/mL. A negative result does not preclude SARS-Cov-2 infection and should not be used as the sole basis for treatment or other patient management decisions. A negative result may occur with  improper specimen collection/handling, submission of specimen other than nasopharyngeal swab, presence of viral mutation(s) within the areas targeted by this assay, and inadequate number of viral copies(<138 copies/mL). A negative result must be combined with clinical observations, patient history, and epidemiological information. The expected result is Negative.  Fact Sheet for Patients:   BloggerCourse.com  Fact Sheet for Healthcare Providers:  SeriousBroker.it  This test is no t yet approved or cleared by the [place] FDA and  has been authorized for detection and/or diagnosis of SARS-CoV-2 by FDA under an Emergency Use Authorization (EUA). This EUA will remain  in effect (meaning this test can be used) for the duration of the COVID-19 declaration under Section 564(b)(1) of the Act, 21 U.S.C.section 360bbb-3(b)(1), unless the authorization is terminated  or revoked sooner.       Influenza A by PCR NEGATIVE NEGATIVE   Influenza B by PCR NEGATIVE NEGATIVE    Comment: (NOTE) The Xpert Xpress SARS-CoV-2/FLU/RSV plus assay is intended as an aid in the diagnosis of influenza from Nasopharyngeal swab specimens and should not be used as a sole basis for treatment. Nasal washings and aspirates are unacceptable for Xpert Xpress SARS-CoV-2/FLU/RSV testing.  Fact Sheet for Patients: BloggerCourse.com  Fact Sheet for Healthcare Providers: SeriousBroker.it  This test is not yet approved or cleared by the [place] FDA and has been authorized for detection and/or diagnosis of SARS-CoV-2 by FDA under an Emergency Use Authorization (EUA). This EUA will remain in effect (meaning this test can be used) for the duration of the COVID-19 declaration under Section 564(b)(1) of the Act, 21 U.S.C. section 360bbb-3(b)(1), unless the authorization is terminated or revoked.     Resp Syncytial Virus by PCR NEGATIVE NEGATIVE    Comment: (NOTE) Fact Sheet for Patients: BloggerCourse.com  Fact Sheet for Healthcare Providers: SeriousBroker.it  This test is not yet approved or cleared by the [place] and has been authorized  for detection and/or diagnosis of SARS-CoV-2 by FDA under an Emergency Use Authorization (EUA).  This EUA will remain in effect (meaning this test can be used) for the duration of the COVID-19 declaration under Section 564(b)(1) of the Act, 21 U.S.C. section 360bbb-3(b)(1), unless the authorization is terminated or revoked.  Performed at [facility], [address] [place], [place] [address]   Strep pneumoniae urinary antigen     Status: None   Collection Time: 11/28/22  7:55 PM  Result Value Ref Range   Strep Pneumo Urinary Antigen NEGATIVE NEGATIVE    Comment:        Infection due to S. pneumoniae cannot be absolutely ruled out since the antigen present may be below the detection limit of the test. Performed at [facility], [address], [place], [place] [address]   Lactic acid, plasma     Status: Abnormal   Collection Time: 11/28/22  9:00 PM  Result Value Ref Range   Lactic Acid, Venous 2.0 (HH) 0.5 - 1.9 mmol/L    Comment: CRITICAL VALUE NOTED. VALUE IS CONSISTENT WITH PREVIOUSLY REPORTED/CALLED VALUE [facility] Performed at [facility], [address] [place], [place] [address]   CBG monitoring, ED     Status: Abnormal   Collection Time: 11/28/22 10:35 PM  Result Value Ref Range   Glucose-Capillary 189 (H) 70 - 99 mg/dL    Comment: Glucose reference range applies only to samples taken after fasting for at least 8 hours.  CBG monitoring, ED     Status: Abnormal   Collection Time: 11/29/22 12:09 AM  Result Value Ref Range   Glucose-Capillary 177 (H) 70 - 99 mg/dL    Comment: Glucose reference range applies only to samples taken after fasting for at least 8 hours.  HIV Antibody (routine testing w rflx)     Status: None   Collection Time: 11/29/22  1:51 AM  Result Value Ref Range   HIV Screen 4th Generation wRfx Non Reactive Non Reactive    Comment: Performed at [facility], [address] [place], [place] [address]  Lactic acid, plasma     Status: None   Collection Time: 11/29/22  1:51 AM  Result Value Ref Range   Lactic Acid,  Venous 1.2 0.5 - 1.9 mmol/L    Comment: Performed at [facility], [address] [place], [place] [address]  Basic metabolic panel     Status: Abnormal   Collection Time: 11/29/22  2:22 AM  Result Value Ref Range   Sodium 137 135 - 145 mmol/L   Potassium 4.2 3.5 - 5.1 mmol/L   Chloride 97 (L) 98 - 111 mmol/L   CO2 31 22 - 32 mmol/L   Glucose, Bld 156 (H) 70 - 99 mg/dL    Comment: Glucose reference range applies only to samples taken after fasting for at least 8 hours.   BUN 21 8 - 23 mg/dL   Creatinine, Ser 1.30 0.44 - 1.00 mg/dL   Calcium 8.0 (L) 8.9 - 10.3 mg/dL   GFR, Estimated >86 >57 mL/min    Comment: (NOTE) Calculated using the CKD-EPI Creatinine Equation (2021)    Anion gap 9 5 - 15    Comment: Performed at [facility], [address] [place], [place] [address]  CBC     Status: Abnormal   Collection Time: 11/29/22  2:22 AM  Result Value Ref Range   WBC 4.3 4.0 - 10.5 K/uL   RBC 3.67 (L)  3.87 - 5.11 MIL/uL   Hemoglobin 10.5 (L) 12.0 - 15.0 g/dL   HCT 40.9 (L) 81.1 - 91.4 %   MCV 92.9 80.0 - 100.0 fL   MCH 28.6 26.0 - 34.0 pg   MCHC 30.8 30.0 - 36.0 g/dL   RDW 78.2 95.6 - 21.3 %   Platelets 168 150 - 400 K/uL   nRBC 0.0 0.0 - 0.2 %    Comment: Performed at [facility], [address] [place], [place] [address]  Lactic acid, plasma     Status: None   Collection Time: 11/29/22  6:13 AM  Result Value Ref Range   Lactic Acid, Venous 0.8 0.5 - 1.9 mmol/L    Comment: Performed at [facility], [address] [place], [place] [address]  CBG monitoring, ED     Status: Abnormal   Collection Time: 11/29/22  8:58 AM  Result Value Ref Range   Glucose-Capillary 173 (H) 70 - 99 mg/dL    Comment: Glucose reference range applies only to samples taken after fasting for at least 8 hours.  Respiratory (~20 pathogens) panel by PCR     Status: Abnormal   Collection Time: 11/29/22  9:02 AM   Specimen: Nasopharyngeal Swab; Respiratory   Result Value Ref Range   Adenovirus NOT DETECTED NOT DETECTED   Coronavirus 229E NOT DETECTED NOT DETECTED    Comment: (NOTE) The Coronavirus on the Respiratory Panel, DOES NOT test for the novel  Coronavirus (2019 nCoV)    Coronavirus HKU1 NOT DETECTED NOT DETECTED   Coronavirus NL63 NOT DETECTED NOT DETECTED   Coronavirus OC43 NOT DETECTED NOT DETECTED   Metapneumovirus NOT DETECTED NOT DETECTED   Rhinovirus / Enterovirus NOT DETECTED NOT DETECTED   Influenza A NOT DETECTED NOT DETECTED   Influenza B NOT DETECTED NOT DETECTED   Parainfluenza Virus 1 DETECTED (A) NOT DETECTED   Parainfluenza Virus 2 NOT DETECTED NOT DETECTED   Parainfluenza Virus 3 NOT DETECTED NOT DETECTED   Parainfluenza Virus 4 NOT DETECTED NOT DETECTED   Respiratory Syncytial Virus NOT DETECTED NOT DETECTED   Bordetella pertussis NOT DETECTED NOT DETECTED   Bordetella Parapertussis NOT DETECTED NOT DETECTED   Chlamydophila pneumoniae NOT DETECTED NOT DETECTED   Mycoplasma pneumoniae NOT DETECTED NOT DETECTED    Comment: Performed at [facility], [address], [place], [place] [address]  Expectorated Sputum Assessment w Gram Stain, Rflx to Resp Cult     Status: None   Collection Time: 11/29/22 12:15 PM   Specimen: Sputum  Result Value Ref Range   Specimen Description SPUTUM    Special Requests EXPSU    Sputum evaluation      THIS SPECIMEN IS ACCEPTABLE FOR SPUTUM CULTURE Performed at [facility], [address] [place], [place] [address]    Report Status 11/29/2022 FINAL   CBG monitoring, ED     Status: Abnormal   Collection Time: 11/29/22 12:23 PM  Result Value Ref Range   Glucose-Capillary 138 (H) 70 - 99 mg/dL    Comment: Glucose reference range applies only to samples taken after fasting for at least 8 hours.  CBG monitoring, ED     Status: Abnormal   Collection Time: 11/29/22  4:50 PM  Result Value Ref Range   Glucose-Capillary 126 (H) 70 - 99 mg/dL    Comment: Glucose  reference range applies only to samples taken after fasting for at least 8 hours.   CT Angio Chest PE W and/or Wo Contrast  Result Date: 11/28/2022 CLINICAL DATA:  Difficulty breathing for 10 days, wheezing, hypoxia, tachycardia EXAM: CT ANGIOGRAPHY CHEST WITH CONTRAST TECHNIQUE: Multidetector CT imaging of the chest was performed using the standard protocol during bolus administration of intravenous contrast. Multiplanar CT image reconstructions and MIPs were obtained to evaluate the vascular anatomy. RADIATION DOSE REDUCTION: This exam was performed according to the departmental dose-optimization program which includes automated exposure control, adjustment of the mA and/or kV according to patient size and/or use of iterative reconstruction technique. CONTRAST:  75mL OMNIPAQUE IOHEXOL 350 MG/ML SOLN COMPARISON:  11/28/2022, 04/05/2016 FINDINGS: Cardiovascular: This is a technically adequate evaluation of the pulmonary vasculature. No filling defects or pulmonary emboli. The heart is unremarkable without pericardial effusion. No evidence of thoracic aortic aneurysm or dissection. Mediastinum/Nodes: No enlarged mediastinal, hilar, or axillary lymph nodes. Thyroid gland, trachea, and esophagus demonstrate no significant findings. Lungs/Pleura: Upper lobe predominant emphysema. There is bilateral bronchial wall thickening, with scattered tree in bud ground-glass airspace disease bilaterally, most pronounced in the left upper and right lower lobes. No other dense consolidation, effusion, or pneumothorax. The central airways are patent. Upper Abdomen: No acute abnormality. Musculoskeletal: No acute or destructive bony abnormalities. Reconstructed images demonstrate no additional findings. Review of the MIP images confirms the above findings. IMPRESSION: 1. No evidence of pulmonary embolus. 2. Bilateral bronchial wall thickening with scattered tree in bud ground-glass airspace opacities, consistent with bronchitis  and mild bilateral bronchopneumonia. 3.  Emphysema (ICD10-J43.9). Electronically Signed   By: Sharlet Salina M.D.   On: 11/28/2022 18:22   DG Chest Port 1 View  Result Date: 11/28/2022 CLINICAL DATA:  Questionable sepsis. Evaluate for abnormality. Shortness of breath. EXAM: PORTABLE CHEST 1 VIEW COMPARISON:  Two-view chest x-ray 11/14/2015.  CT chest 04/05/2016. FINDINGS: The heart size and mediastinal contours are within normal limits. Both lungs are clear. The visualized skeletal structures are unremarkable. IMPRESSION: Negative one-view chest x-ray Electronically Signed   By: Marin Roberts M.D.   On: 11/28/2022 16:56    Pending Labs Unresulted Labs (From admission, onward)     Start     Ordered   11/29/22 1215  Culture, Respiratory w Gram Stain  Once,   R        11/29/22 1215   11/28/22 1904  Legionella Pneumophila Serogp 1 Ur Ag  (COPD / Pneumonia / Cellulitis / Lower Extremity Wound)  Once,   R        11/28/22 1904   11/28/22 1902  Hemoglobin A1c  Add-on,   AD       Comments: To assess prior glycemic control    11/28/22 1901            Vitals/Pain Today's Vitals   11/29/22 1600 11/29/22 1615 11/29/22 1630 11/29/22 1647  BP: 123/77  117/75   Pulse: 88 100 85   Resp: (!) 22 (!) 27 20   Temp:    99.1 F (37.3 C)  TempSrc:    Oral  SpO2: 98% 100% 99%   Weight:      Height:      PainSc:        Isolation Precautions Droplet precaution  Medications Medications  cefTRIAXone (ROCEPHIN) 2 g in sodium chloride 0.9 % 100 mL IVPB (0 g Intravenous Stopped 11/28/22 1927)  nicotine (NICODERM CQ - dosed in mg/24 hours) patch 21 mg (21 mg Transdermal Patch Applied 11/29/22 0921)  dextromethorphan-guaiFENesin (MUCINEX DM) 30-600 MG per 12 hr tablet 1 tablet (1 tablet Oral Given 11/29/22 1245)  ondansetron (  ZOFRAN) injection 4 mg (has no administration in time range)  acetaminophen (TYLENOL) tablet 650 mg (650 mg Oral Given 11/29/22 0918)  0.9 %  sodium chloride infusion (  Intravenous New Bag/Given 11/29/22 1315)  insulin aspart (novoLOG) injection 0-9 Units (1 Units Subcutaneous Given 11/29/22 1654)  insulin aspart (novoLOG) injection 0-5 Units ( Subcutaneous Not Given 11/28/22 2238)  enoxaparin (LOVENOX) injection 40 mg (40 mg Subcutaneous Given 11/28/22 2103)  senna-docusate (Senokot-S) tablet 1 tablet (1 tablet Oral Given 11/29/22 0918)  polyethylene glycol (MIRALAX / GLYCOLAX) packet 17 g (has no administration in time range)  traZODone (DESYREL) tablet 300 mg (300 mg Oral Given 11/28/22 2341)  venlafaxine XR (EFFEXOR-XR) 24 hr capsule 150 mg (150 mg Oral Given 11/29/22 1118)  linaclotide (LINZESS) capsule 145 mcg (145 mcg Oral Given 11/29/22 1118)  pantoprazole (PROTONIX) EC tablet 40 mg (40 mg Oral Given 11/29/22 0918)  busPIRone (BUSPAR) tablet 15 mg (15 mg Oral Given 11/29/22 0918)  influenza vaccine adjuvanted (FLUAD) injection 0.5 mL (has no administration in time range)  baclofen (LIORESAL) tablet 10 mg (10 mg Oral Given 11/29/22 1642)  gabapentin (NEURONTIN) capsule 100 mg (has no administration in time range)  aspirin EC tablet 81 mg (81 mg Oral Given 11/29/22 0918)  azithromycin (ZITHROMAX) tablet 500 mg (has no administration in time range)  ipratropium-albuterol (DUONEB) 0.5-2.5 (3) MG/3ML nebulizer solution 3 mL (3 mLs Nebulization Given 11/29/22 1421)  methylPREDNISolone sodium succinate (SOLU-MEDROL) 40 mg/mL injection 40 mg (40 mg Intravenous Given 11/29/22 1142)  albuterol (PROVENTIL) (2.5 MG/3ML) 0.083% nebulizer solution 2.5 mg (2.5 mg Nebulization Given 11/29/22 1245)  sodium chloride 0.9 % bolus 1,000 mL (0 mLs Intravenous Stopped 11/28/22 1855)  ipratropium-albuterol (DUONEB) 0.5-2.5 (3) MG/3ML nebulizer solution 3 mL (3 mLs Nebulization Given 11/28/22 1641)  iohexol (OMNIPAQUE) 350 MG/ML injection 75 mL (75 mLs Intravenous Contrast Given 11/28/22 1804)  sodium chloride 0.9 % bolus 500 mL (0 mLs Intravenous Stopped 11/28/22 2052)     Mobility walks     Focused Assessments Pulmonary Assessment Handoff:  Lung sounds: Bilateral Breath Sounds: Diminished, Inspiratory wheezes, Expiratory wheezes O2 Device: Nasal Cannula (switched off bipap, to [place] per request for food and PO meds) O2 Flow Rate (L/min): 4 L/min    R Recommendations: See Admitting Provider Note  Report given to:   Additional Notes: A&Ox4, NAD, calm, interactive, pleasant, comfortable on 4L , wears 2L at home, when she gets exacerbated she has switched to bipap for a while, poorly tolerated ambulation to [facility], wearing brief, is continent.

## 2022-11-29 NOTE — ED Notes (Signed)
Off bipap, on 4L. Doing well, set up for breakfast per RRT

## 2022-11-29 NOTE — ED Notes (Addendum)
Sudden anxiety and subjective sob after removing her oxygen when neb finished, returned to Bipap by other RNs x2, "feeling better".

## 2022-11-29 NOTE — Progress Notes (Signed)
PROGRESS NOTE    Sheri Barron  WGN:[id number] DOB: January 02, 1956 DOA: 11/28/2022 PCP: Abram Sander, MD     Brief Narrative:  Sheri Barron is a 67 y.o. female with medical history significant of asthma, COPD on 2 L oxygen at night, HTN, HLD, stroke, depression with anxiety, chronic constipation, tobacco abuse, who presents with shortness of breath.   Patient states that she has shortness of breath for more than 10 days, which has been progressively worsening.  Patient has dry cough and wheezing.  Denies chest pain.  No fever or chills.  Patient is constipated chronically. She has nausea, no vomiting or abdominal pain.  Patient reports symptoms of UTI, including dysuria, burning on urination and increased urinary frequency.  No fever or chills.   Patient is using 2 L oxygen only at night at home.  Patient is found to have severe respiratory distress, oxygen desaturation to 80s% on room air, initially started on 5 L oxygen, still has respiratory distress, cannot speak in full sentence.  BiPAP is started in ED.   Assessment & Plan:   Principal Problem:   COPD exacerbation (HCC) Active Problems:   Acute on chronic respiratory failure with hypoxia and hypercapnia (HCC)   CAP (community acquired pneumonia)   Severe sepsis (HCC)   HTN (hypertension)   Diabetes mellitus without complication (HCC)   Stroke (HCC)   Depression with anxiety   Tobacco abuse   Irritable bowel syndrome without diarrhea  # COPD with acute exacerbation Several weeks worsening dyspnea, cough. Possible superimposed pneumonia. Required bipap on admission, now weaned off. Feeling a little bit better today. Received 3 days steroids as outpatient, no recent abx. - continue steroids, abx (ceftriaxone/azithromycin), breathing treatments  # Acute hypoxic hypercarbic respiratory failure Presented with respiratory distress, hypercarbia. Placed on bipap overnight, now weaned off. Is on 2 liters at night at baseline. -  continue [place] O2, wean as able  # CAP CTA no PE but shows signs bronchitis and b/l bronchopneumonia. Procal is, however, low. Covid/flu/rsv neg. No LE swelling or pulm edema to suggest chf exacerbation, no chest pain to suggest acs. - continue abx, at a minimum the will have anti-inflammatory properties that will help COPD - f/u RVP, sputum culture - f/u blood cultures  # Sepsis, severe By elevated lactate, tachycardia and tachypnea. Sepisis physiology resolved - maintain gentle fluids today.  # Debility Ambulates w/ a cane at baseline, lives alone - PT consult  # Chronic back pain - home baclofen, gabapentin  # Palpitations - hold propranolol until bp stabilizes  # IBS - home linzess  # HTN Here BP low normal in setting of above processes - hold home lisinopril/hydrochlorothiazide  # Hx CVA - cont home aspirin (but will reduce dose from 325 to 81)  # GAD - cont home buspar, trazodone, effexor  # T2DM Here mildly hyperglycemic - hold home metformin - SSI   DVT prophylaxis: lovenox Code Status: DNR Family Communication: none   Level of care: Progressive Status is: Inpatient Remains inpatient appropriate because: severity of illness    Consultants:  none  Procedures: none  Antimicrobials:  Ceftriaxone/azithromycin    Subjective: Reports dyspnea and cough persist, slightly better, no chest pain  Objective: Vitals:   11/29/22 0720 11/29/22 0730 11/29/22 0800 11/29/22 0806  BP:  97/64 102/88   Pulse:  73 74 82  Resp:  17 18 16   Temp: 97.6 F (36.4 C)     TempSrc: Axillary     SpO2:  99% 99% 97%  Weight:      Height:        Intake/Output Summary (Last 24 hours) at 11/29/2022 1610 Last data filed at 11/29/2022 0209 Gross per 24 hour  Intake 1858.91 ml  Output 1 ml  Net 1857.91 ml   Filed Weights   11/28/22 1636  Weight: 61.2 kg    Examination:  General exam: Appears chronically ill, NAD Respiratory system: exp wheeze and rhonchi  throughout Cardiovascular system: S1 & S2 heard, RRR. Distant heart sounds Gastrointestinal system: Abdomen is nondistended, soft and nontender. No organomegaly or masses felt.   Central nervous system: Alert and oriented. No focal neurological deficits. Extremities: Symmetric 5 x 5 power. No edema, warm Skin: No rashes, lesions or ulcers Psychiatry: Judgement and insight appear normal. Mood & affect appropriate.     Data Reviewed: I have personally reviewed following labs and imaging studies  CBC: Recent Labs  Lab 11/28/22 1630 11/29/22 0222  WBC 8.4 4.3  NEUTROABS 5.4  --   HGB 11.9* 10.5*  HCT 38.9 34.1*  MCV 91.7 92.9  PLT 174 168   Basic Metabolic Panel: Recent Labs  Lab 11/28/22 1630 11/29/22 0222  NA 139 137  K 4.1 4.2  CL 94* 97*  CO2 34* 31  GLUCOSE 102* 156*  BUN 22 21  CREATININE 1.01* 0.95  CALCIUM 8.7* 8.0*  MG 1.7  --    GFR: Estimated Creatinine Clearance: 47.6 mL/min (by C-G formula based on SCr of 0.95 mg/dL). Liver Function Tests: Recent Labs  Lab 11/28/22 1630  AST 16  ALT 15  ALKPHOS 73  BILITOT 0.7  PROT 7.2  ALBUMIN 3.7   No results for input(s): "LIPASE", "AMYLASE" in the last 168 hours. No results for input(s): "AMMONIA" in the last 168 hours. Coagulation Profile: No results for input(s): "INR", "PROTIME" in the last 168 hours. Cardiac Enzymes: No results for input(s): "CKTOTAL", "CKMB", "CKMBINDEX", "TROPONINI" in the last 168 hours. BNP (last 3 results) No results for input(s): "PROBNP" in the last 8760 hours. HbA1C: No results for input(s): "HGBA1C" in the last 72 hours. CBG: Recent Labs  Lab 11/28/22 2235 11/29/22 0009  GLUCAP 189* 177*   Lipid Profile: No results for input(s): "CHOL", "HDL", "LDLCALC", "TRIG", "CHOLHDL", "LDLDIRECT" in the last 72 hours. Thyroid Function Tests: No results for input(s): "TSH", "T4TOTAL", "FREET4", "T3FREE", "THYROIDAB" in the last 72 hours. Anemia Panel: No results for input(s):  "VITAMINB12", "FOLATE", "FERRITIN", "TIBC", "IRON", "RETICCTPCT" in the last 72 hours. Urine analysis:    Component Value Date/Time   COLORURINE YELLOW (A) 11/28/2022 1850   APPEARANCEUR CLEAR (A) 11/28/2022 1850   LABSPEC 1.039 (H) 11/28/2022 1850   PHURINE 5.0 11/28/2022 1850   GLUCOSEU NEGATIVE 11/28/2022 1850   HGBUR NEGATIVE 11/28/2022 1850   BILIRUBINUR NEGATIVE 11/28/2022 1850   KETONESUR 20 (A) 11/28/2022 1850   PROTEINUR NEGATIVE 11/28/2022 1850   NITRITE NEGATIVE 11/28/2022 1850   LEUKOCYTESUR NEGATIVE 11/28/2022 1850   Sepsis Labs: @LABRCNTIP (procalcitonin:4,lacticidven:4)  ) Recent Results (from the past 240 hour(s))  Blood Culture (routine x 2)     Status: None (Preliminary result)   Collection Time: 11/28/22  4:32 PM   Specimen: BLOOD  Result Value Ref Range Status   Specimen Description BLOOD RIGHT [facility]  Final   Special Requests BOTTLES DRAWN AEROBIC AND ANAEROBIC  Final   Culture   Final    NO GROWTH < 12 HOURS Performed at [facility], [address], [place], [place] [address]  Report Status PENDING  Incomplete  Blood Culture (routine x 2)     Status: None (Preliminary result)   Collection Time: 11/28/22  4:32 PM   Specimen: BLOOD  Result Value Ref Range Status   Specimen Description BLOOD LEFT AC  Final   Special Requests BOTTLES DRAWN AEROBIC AND ANAEROBIC  Final   Culture   Final    NO GROWTH < 12 HOURS Performed at [facility], [address] [place], [place] [address]    Report Status PENDING  Incomplete  Resp panel by RT-PCR (RSV, Flu A&B, Covid) Anterior Nasal Swab     Status: None   Collection Time: 11/28/22  7:55 PM   Specimen: Anterior Nasal Swab  Result Value Ref Range Status   SARS Coronavirus 2 by RT PCR NEGATIVE NEGATIVE Final    Comment: (NOTE) SARS-CoV-2 target nucleic acids are NOT DETECTED.  The SARS-CoV-2 RNA is generally detectable in upper respiratory specimens during the acute phase of infection. The  lowest concentration of SARS-CoV-2 viral copies this assay can detect is 138 copies/mL. A negative result does not preclude SARS-Cov-2 infection and should not be used as the sole basis for treatment or other patient management decisions. A negative result may occur with  improper specimen collection/handling, submission of specimen other than nasopharyngeal swab, presence of viral mutation(s) within the areas targeted by this assay, and inadequate number of viral copies(<138 copies/mL). A negative result must be combined with clinical observations, patient history, and epidemiological information. The expected result is Negative.  Fact Sheet for Patients:  BloggerCourse.com  Fact Sheet for Healthcare Providers:  SeriousBroker.it  This test is no t yet approved or cleared by the [place] FDA and  has been authorized for detection and/or diagnosis of SARS-CoV-2 by FDA under an Emergency Use Authorization (EUA). This EUA will remain  in effect (meaning this test can be used) for the duration of the COVID-19 declaration under Section 564(b)(1) of the Act, 21 U.S.C.section 360bbb-3(b)(1), unless the authorization is terminated  or revoked sooner.       Influenza A by PCR NEGATIVE NEGATIVE Final   Influenza B by PCR NEGATIVE NEGATIVE Final    Comment: (NOTE) The Xpert Xpress SARS-CoV-2/FLU/RSV plus assay is intended as an aid in the diagnosis of influenza from Nasopharyngeal swab specimens and should not be used as a sole basis for treatment. Nasal washings and aspirates are unacceptable for Xpert Xpress SARS-CoV-2/FLU/RSV testing.  Fact Sheet for Patients: BloggerCourse.com  Fact Sheet for Healthcare Providers: SeriousBroker.it  This test is not yet approved or cleared by the [place] FDA and has been authorized for detection and/or diagnosis of SARS-CoV-2 by FDA under  an Emergency Use Authorization (EUA). This EUA will remain in effect (meaning this test can be used) for the duration of the COVID-19 declaration under Section 564(b)(1) of the Act, 21 U.S.C. section 360bbb-3(b)(1), unless the authorization is terminated or revoked.     Resp Syncytial Virus by PCR NEGATIVE NEGATIVE Final    Comment: (NOTE) Fact Sheet for Patients: BloggerCourse.com  Fact Sheet for Healthcare Providers: SeriousBroker.it  This test is not yet approved or cleared by the [place] FDA and has been authorized for detection and/or diagnosis of SARS-CoV-2 by FDA under an Emergency Use Authorization (EUA). This EUA will remain in effect (meaning this test can be used) for the duration of the COVID-19 declaration under Section 564(b)(1) of the Act, 21 U.S.C. section 360bbb-3(b)(1), unless the authorization is terminated or revoked.  Performed at  [facility], [address] [place], [place] [address]          Radiology Studies: CT Angio Chest PE W and/or Wo Contrast  Result Date: 11/28/2022 CLINICAL DATA:  Difficulty breathing for 10 days, wheezing, hypoxia, tachycardia EXAM: CT ANGIOGRAPHY CHEST WITH CONTRAST TECHNIQUE: Multidetector CT imaging of the chest was performed using the standard protocol during bolus administration of intravenous contrast. Multiplanar CT image reconstructions and MIPs were obtained to evaluate the vascular anatomy. RADIATION DOSE REDUCTION: This exam was performed according to the departmental dose-optimization program which includes automated exposure control, adjustment of the mA and/or kV according to patient size and/or use of iterative reconstruction technique. CONTRAST:  75mL OMNIPAQUE IOHEXOL 350 MG/ML SOLN COMPARISON:  11/28/2022, 04/05/2016 FINDINGS: Cardiovascular: This is a technically adequate evaluation of the pulmonary vasculature. No filling defects or pulmonary  emboli. The heart is unremarkable without pericardial effusion. No evidence of thoracic aortic aneurysm or dissection. Mediastinum/Nodes: No enlarged mediastinal, hilar, or axillary lymph nodes. Thyroid gland, trachea, and esophagus demonstrate no significant findings. Lungs/Pleura: Upper lobe predominant emphysema. There is bilateral bronchial wall thickening, with scattered tree in bud ground-glass airspace disease bilaterally, most pronounced in the left upper and right lower lobes. No other dense consolidation, effusion, or pneumothorax. The central airways are patent. Upper Abdomen: No acute abnormality. Musculoskeletal: No acute or destructive bony abnormalities. Reconstructed images demonstrate no additional findings. Review of the MIP images confirms the above findings. IMPRESSION: 1. No evidence of pulmonary embolus. 2. Bilateral bronchial wall thickening with scattered tree in bud ground-glass airspace opacities, consistent with bronchitis and mild bilateral bronchopneumonia. 3.  Emphysema (ICD10-J43.9). Electronically Signed   By: Sharlet Salina M.D.   On: 11/28/2022 18:22   DG Chest Port 1 View  Result Date: 11/28/2022 CLINICAL DATA:  Questionable sepsis. Evaluate for abnormality. Shortness of breath. EXAM: PORTABLE CHEST 1 VIEW COMPARISON:  Two-view chest x-ray 11/14/2015.  CT chest 04/05/2016. FINDINGS: The heart size and mediastinal contours are within normal limits. Both lungs are clear. The visualized skeletal structures are unremarkable. IMPRESSION: Negative one-view chest x-ray Electronically Signed   By: Marin Roberts M.D.   On: 11/28/2022 16:56        Scheduled Meds:  aspirin  325 mg Oral Daily   busPIRone  15 mg Oral BID   enoxaparin (LOVENOX) injection  40 mg Subcutaneous Q24H   [START ON 11/30/2022] influenza vaccine adjuvanted  0.5 mL Intramuscular Tomorrow-1000   insulin aspart  0-5 Units Subcutaneous QHS   insulin aspart  0-9 Units Subcutaneous TID WC    ipratropium-albuterol  3 mL Nebulization Q4H   linaclotide  145 mcg Oral QAC breakfast   methylPREDNISolone (SOLU-MEDROL) injection  80 mg Intravenous Q12H   nicotine  21 mg Transdermal Daily   pantoprazole  40 mg Oral Daily   propranolol  40 mg Oral BID   senna-docusate  1 tablet Oral BID   traZODone  300 mg Oral QHS   venlafaxine XR  150 mg Oral Q breakfast   Continuous Infusions:  sodium chloride 75 mL/hr at 11/28/22 2243   azithromycin Stopped (11/28/22 2053)   cefTRIAXone (ROCEPHIN)  IV Stopped (11/28/22 1927)     LOS: 1 day   ACP planning: after extensive discussion of the meaning of code status, the various options available, meaning of dnr and its implications, patient elects to be DNR but would want full scope of care prior to arrest. Time spent 17 min  Silvano Bilis, MD [facility]  If 7PM-7AM, please contact night-coverage [email or web address] Password [facility] 11/29/2022, 8:32 AM

## 2022-11-29 NOTE — ED Notes (Signed)
Resting comfortably on Bipap, RT at [facility].

## 2022-11-29 NOTE — ED Notes (Signed)
Pt was assisted to the room toilet with one person assist. Pt was then assisted back to bed. Pt was then given 2 cups of apple sauce per pt request.

## 2022-11-29 NOTE — ED Notes (Addendum)
Tolerated ambulating to [facility] poorly with O2NC, returned to Bipap. #IV flushed.

## 2022-11-29 NOTE — ED Notes (Signed)
Off Bipap, returned to 4L [place], "feeling better", up to [facility]

## 2022-11-30 DIAGNOSIS — B348 Other viral infections of unspecified site: Secondary | ICD-10-CM | POA: Insufficient documentation

## 2022-11-30 DIAGNOSIS — J441 Chronic obstructive pulmonary disease with (acute) exacerbation: Secondary | ICD-10-CM | POA: Diagnosis not present

## 2022-11-30 LAB — GLUCOSE, CAPILLARY
Glucose-Capillary: 164 mg/dL — ABNORMAL HIGH (ref 70–99)
Glucose-Capillary: 195 mg/dL — ABNORMAL HIGH (ref 70–99)
Glucose-Capillary: 136 mg/dL — ABNORMAL HIGH (ref 70–99)
Glucose-Capillary: 116 mg/dL — ABNORMAL HIGH (ref 70–99)
Glucose-Capillary: 114 mg/dL — ABNORMAL HIGH (ref 70–99)

## 2022-11-30 MED ORDER — INSULIN GLARGINE-YFGN 100 UNIT/ML ~~LOC~~ SOLN
5.0000 [IU] | Freq: Every day | SUBCUTANEOUS | Status: DC
  Administered 2022-11-30 – 2022-12-04 (×5): 5 [IU] via SUBCUTANEOUS
  Filled 2022-11-30 (×6): qty 0.05

## 2022-11-30 MED ORDER — PROPRANOLOL HCL 40 MG PO TABS
40.0000 mg | ORAL_TABLET | Freq: Two times a day (BID) | ORAL | Status: DC
  Administered 2022-11-30 – 2022-12-05 (×10): 40 mg via ORAL
  Filled 2022-11-30 (×11): qty 1

## 2022-11-30 MED ORDER — IPRATROPIUM-ALBUTEROL 0.5-2.5 (3) MG/3ML IN SOLN
3.0000 mL | Freq: Three times a day (TID) | RESPIRATORY_TRACT | Status: DC
  Administered 2022-11-30 – 2022-12-05 (×14): 3 mL via RESPIRATORY_TRACT
  Filled 2022-11-30 (×15): qty 3

## 2022-11-30 NOTE — Evaluation (Signed)
Physical Therapy Evaluation Patient Details Name: Sheri Barron MRN: [id number] DOB: [birth year] Today's Date: 11/30/2022  History of Present Illness  Pt is 67 y/o admitted 11/28/22 for COPD exacerbation. Pt experiences symptoms of dyspnea, fatigue, and the inability to produce of productive cough.PmHx includes asthma, HTN, and anxiety.   Clinical Impression  Pt received in bed on 4L of O2 [place] and agreed to PT session. Pt presents with full body tremors and non-productive cough. Pt performed bed mobility SUP where an increase in O2 to 7L was necessary due to desat. Throughout session pt demonstrated anxiety with mobility which increased their tremors and a non-productive cough which caused the pt increased difficulty to breathe. While seated EOB, pt performed x5 bilat knee ext and x10 bilat ankle pumps. Due to tremors, dyspnea, and anxiety further mobility was deferred. Vitals throughout session read 80-93% SpO2 on O2 and ~107 bpm. HR readings were difficult to detect. Pt left in bed on 6L of O2 [place] as when they cough, they desat easily. RN notified regarding pt performance during session. Pt did not tolerate Tx well today and will continue to benefit from skilled PT sessions to improve activity tolerance and functional mobility to maximize safety/IND.       If plan is discharge home, recommend the following: Assist for transportation;Help with stairs or ramp for entrance;A little help with bathing/dressing/bathroom;A lot of help with walking and/or transfers   Can travel by private vehicle   No    Equipment Recommendations BSC/3in1;Rolling walker (2 wheels)  Recommendations for Other Services       Functional Status Assessment Patient has had a recent decline in their functional status and demonstrates the ability to make significant improvements in function in a reasonable and predictable amount of time.     Precautions / Restrictions Precautions Precautions: Fall Restrictions Weight  Bearing Restrictions: No      Mobility  Bed Mobility Overal bed mobility: Needs Assistance Bed Mobility: Supine to Sit, Sit to Supine     Supine to sit: Supervision, HOB elevated Sit to supine: Supervision, HOB elevated   General bed mobility comments: Pt performed bed mobility SUP and required increased time to complete activity. Pursed lip breathing encouraged prior to mobility as pt demonstrated feeling anxious with moving out of bed.    Transfers                   General transfer comment: Deferred. Pt declined further mobility due to fatigue and difficulty breathing/getting sputum dislodged.    Ambulation/Gait               General Gait Details: deferred  Stairs            Wheelchair Mobility     Tilt Bed    Modified Rankin (Stroke Patients Only)       Balance Overall balance assessment: Needs assistance Sitting-balance support: Feet supported Sitting balance-Leahy Scale: Fair         Standing balance comment: Deferred. Standing balance not observed                             Pertinent Vitals/Pain Pain Assessment Pain Assessment: Faces Faces Pain Scale: Hurts a little bit Pain Location: Back Pain Descriptors / Indicators: Aching Pain Intervention(s): Monitored during session    Home Living Family/patient expects to be discharged to:: Private residence Living Arrangements: Alone Available Help at Discharge: Family;Available PRN/intermittently Type of Home: Mobile home Home  Access: Ramped entrance       Home Layout: One level Home Equipment: Cane - single point;Tub bench Additional Comments: Pt reports having canes spread out throughout the house for easy access.    Prior Function Prior Level of Function : Needs assist             Mobility Comments: Pt reports that they do not leave their home unless they are going to an appointment. Pt has a driver that brings them to appointments, they do not drive.  Majority of the time pt states being at home and has canes spread out throughout the house for easy access. Often, pt uses cane and the wall/furniture to move around in the house. ADLs Comments: Pt reports that they are IND with ADLs, however they have had to alter things for saftey reasons. For instance: Pt has not been taking a shower while at home because they are fearful of stepping over and into their bath tub.     Extremity/Trunk Assessment   Upper Extremity Assessment Upper Extremity Assessment: Generalized weakness    Lower Extremity Assessment Lower Extremity Assessment: Generalized weakness       Communication   Communication Communication: No apparent difficulties Cueing Techniques: Verbal cues;Tactile cues  Cognition Arousal: Alert Behavior During Therapy: Anxious Overall Cognitive Status: Within Functional Limits for tasks assessed                                 General Comments: AOx4. Pt pleasant and willing to participate in PT session. Pt reports feeling anxious with mobility.        General Comments      Exercises Other Exercises Other Exercises: 5x knee extension, 10x ankle pumps. Pt reports doing this often on her own even prior to admission into hospital. Pt requires increased time with activity.   Assessment/Plan    PT Assessment Patient needs continued PT services  PT Problem List Decreased strength;Decreased activity tolerance;Decreased mobility;Cardiopulmonary status limiting activity       PT Treatment Interventions Therapeutic exercise;Functional mobility training    PT Goals (Current goals can be found in the Care Plan section)  Acute Rehab PT Goals Patient Stated Goal: To go home and get better PT Goal Formulation: With patient Time For Goal Achievement: 12/14/22 Potential to Achieve Goals: Good    Frequency Min 1X/week     Co-evaluation               AM-PAC PT "6 Clicks" Mobility  Outcome Measure Help needed  turning from your back to your side while in a flat bed without using bedrails?: A Little Help needed moving from lying on your back to sitting on the side of a flat bed without using bedrails?: A Little Help needed moving to and from a bed to a chair (including a wheelchair)?: A Little Help needed standing up from a chair using your arms (e.g., wheelchair or bedside chair)?: A Lot Help needed to walk in hospital room?: A Lot Help needed climbing 3-5 steps with a railing? : A Lot 6 Click Score: 15    End of Session   Activity Tolerance: Patient limited by fatigue Patient left: in bed;with call bell/phone within reach;with bed alarm set Nurse Communication: Mobility status PT Visit Diagnosis: Unsteadiness on feet (R26.81);Other abnormalities of gait and mobility (R26.89);Muscle weakness (generalized) (M62.81);Difficulty in walking, not elsewhere classified (R26.2)    Time: 1610-9604 PT Time Calculation (min) (ACUTE  ONLY): 35 min   Charges:   PT Evaluation $PT Eval Moderate Complexity: 1 Mod PT Treatments $Therapeutic Exercise: 8-22 mins PT General Charges $$ ACUTE PT VISIT: 1 Visit         Gaetano Romberger [place] SPT, LAT, ATC   Kieanna Rollo [place]How[place] 11/30/2022, 2:04 PM

## 2022-11-30 NOTE — Progress Notes (Addendum)
PROGRESS NOTE    [name]  NWG:[id number] DOB: 11/21/1955 DOA: 11/28/2022 PCP: Abram Sander, MD     Brief Narrative:  Sheri Barron is a 67 y.o. female with medical history significant of asthma, COPD on 2 L oxygen at night, HTN, HLD, stroke, depression with anxiety, chronic constipation, tobacco abuse, who presents with shortness of breath.   Patient states that she has shortness of breath for more than 10 days, which has been progressively worsening.  Patient has dry cough and wheezing.  Denies chest pain.  No fever or chills.  Patient is constipated chronically. She has nausea, no vomiting or abdominal pain.  Patient reports symptoms of UTI, including dysuria, burning on urination and increased urinary frequency.  No fever or chills.   Patient is using 2 L oxygen only at night at home.  Patient is found to have severe respiratory distress, oxygen desaturation to 80s% on room air, initially started on 5 L oxygen, still has respiratory distress, cannot speak in full sentence.  BiPAP is started in ED.   Assessment & Plan:   Principal Problem:   COPD exacerbation (HCC) Active Problems:   Acute on chronic respiratory failure with hypoxia and hypercapnia (HCC)   CAP (community acquired pneumonia)   Severe sepsis (HCC)   HTN (hypertension)   Diabetes mellitus without complication (HCC)   Stroke (HCC)   Depression with anxiety   Tobacco abuse   Irritable bowel syndrome without diarrhea   Infection due to parainfluenza virus 1  # COPD with acute exacerbation # Parainfluenza virus 1 infection Several weeks worsening dyspnea, cough. Parainfluenza 1 positive. Required bipap on admission, now weaned off though required again overnight. Feeling a little bit better today. Received 3 days steroids as outpatient, no recent abx. - continue steroids, abx (ceftriaxone), breathing treatments  # Acute hypoxic hypercarbic respiratory failure Presented with respiratory distress,  hypercarbia. Placed on bipap overnight, now weaned off. Is on 2 liters at night at baseline. - continue [place] O2, wean as able  # CAP CTA no PE but shows signs bronchitis and b/l bronchopneumonia. Procal is, however, low. Covid/flu/rsv neg. No LE swelling or pulm edema to suggest chf exacerbation, no chest pain to suggest acs. - continue abx, at a minimum the will have anti-inflammatory properties that will help COPD - f/u RVP, sputum culture - f/u blood cultures  # Sepsis, severe By elevated lactate, tachycardia and tachypnea. Sepisis physiology resolved - d/c fluids  # Debility Ambulates w/ a cane at baseline, lives alone - PT advising snf, toc consulted  # Chronic back pain - home baclofen, gabapentin  # Tremor - home propranolol  # IBS - home linzess  # HTN Bp improved today, wnl - hold home lisinopril/hydrochlorothiazide  # Hx CVA - cont home aspirin (but will reduce dose from 325 to 81)  # GAD - cont home buspar, trazodone, effexor  # T2DM Here mildly hyperglycemic - hold home metformin - SSI - add semglee 5   DVT prophylaxis: lovenox Code Status: DNR Family Communication: none at bedside. Message left w/ daughter Burtis Junes.  Level of care: Progressive Status is: Inpatient Remains inpatient appropriate because: severity of illness    Consultants:  none  Procedures: none  Antimicrobials:  Ceftriaxone/azithromycin> ceftriaxone   Subjective: Reports dyspnea and cough persist, slightly better, no chest pain, tolerating diet  Objective: Vitals:   11/30/22 0810 11/30/22 0901 11/30/22 1145 11/30/22 1352  BP:  (P) 139/84 123/73   Pulse:   (!) 101  Resp:   16   Temp:   97.8 F (36.6 C)   TempSrc:      SpO2: 100%  100% 100%  Weight:      Height:        Intake/Output Summary (Last 24 hours) at 11/30/2022 1456 Last data filed at 11/30/2022 0900 Gross per 24 hour  Intake --  Output 800 ml  Net -800 ml   Filed Weights   11/28/22 1636 11/29/22  1900  Weight: 61.2 kg 63.7 kg    Examination:  General exam: Appears chronically ill, NAD Respiratory system: exp wheeze and rhonchi throughout Cardiovascular system: S1 & S2 heard, RRR. Distant heart sounds Gastrointestinal system: Abdomen is nondistended, soft and nontender. No organomegaly or masses felt.   Central nervous system: Alert and oriented. No focal neurological deficits. Rest tremor Extremities: Symmetric 5 x 5 power. No edema, warm Skin: No rashes, lesions or ulcers Psychiatry: Judgement and insight appear normal. Mood & affect appropriate.     Data Reviewed: I have personally reviewed following labs and imaging studies  CBC: Recent Labs  Lab 11/28/22 1630 11/29/22 0222  WBC 8.4 4.3  NEUTROABS 5.4  --   HGB 11.9* 10.5*  HCT 38.9 34.1*  MCV 91.7 92.9  PLT 174 168   Basic Metabolic Panel: Recent Labs  Lab 11/28/22 1630 11/29/22 0222  NA 139 137  K 4.1 4.2  CL 94* 97*  CO2 34* 31  GLUCOSE 102* 156*  BUN 22 21  CREATININE 1.01* 0.95  CALCIUM 8.7* 8.0*  MG 1.7  --    GFR: Estimated Creatinine Clearance: 48.6 mL/min (by C-G formula based on SCr of 0.95 mg/dL). Liver Function Tests: Recent Labs  Lab 11/28/22 1630  AST 16  ALT 15  ALKPHOS 73  BILITOT 0.7  PROT 7.2  ALBUMIN 3.7   No results for input(s): "LIPASE", "AMYLASE" in the last 168 hours. No results for input(s): "AMMONIA" in the last 168 hours. Coagulation Profile: No results for input(s): "INR", "PROTIME" in the last 168 hours. Cardiac Enzymes: No results for input(s): "CKTOTAL", "CKMB", "CKMBINDEX", "TROPONINI" in the last 168 hours. BNP (last 3 results) No results for input(s): "PROBNP" in the last 8760 hours. HbA1C: Recent Labs    11/28/22 1632  HGBA1C 6.2*   CBG: Recent Labs  Lab 11/29/22 1223 11/29/22 1650 11/29/22 2101 11/30/22 0734 11/30/22 1142  GLUCAP 138* 126* 103* 164* 195*   Lipid Profile: No results for input(s): "CHOL", "HDL", "LDLCALC", "TRIG",  "CHOLHDL", "LDLDIRECT" in the last 72 hours. Thyroid Function Tests: No results for input(s): "TSH", "T4TOTAL", "FREET4", "T3FREE", "THYROIDAB" in the last 72 hours. Anemia Panel: No results for input(s): "VITAMINB12", "FOLATE", "FERRITIN", "TIBC", "IRON", "RETICCTPCT" in the last 72 hours. Urine analysis:    Component Value Date/Time   COLORURINE YELLOW (A) 11/28/2022 1850   APPEARANCEUR CLEAR (A) 11/28/2022 1850   LABSPEC 1.039 (H) 11/28/2022 1850   PHURINE 5.0 11/28/2022 1850   GLUCOSEU NEGATIVE 11/28/2022 1850   HGBUR NEGATIVE 11/28/2022 1850   BILIRUBINUR NEGATIVE 11/28/2022 1850   KETONESUR 20 (A) 11/28/2022 1850   PROTEINUR NEGATIVE 11/28/2022 1850   NITRITE NEGATIVE 11/28/2022 1850   LEUKOCYTESUR NEGATIVE 11/28/2022 1850   Sepsis Labs: @LABRCNTIP (procalcitonin:4,lacticidven:4)  ) Recent Results (from the past 240 hour(s))  Blood Culture (routine x 2)     Status: None (Preliminary result)   Collection Time: 11/28/22  4:32 PM   Specimen: BLOOD  Result Value Ref Range Status   Specimen Description BLOOD RIGHT AC  Final  Special Requests BOTTLES DRAWN AEROBIC AND ANAEROBIC  Final   Culture   Final    NO GROWTH 2 DAYS Performed at [facility], [address] [place], [place] [address]    Report Status PENDING  Incomplete  Blood Culture (routine x 2)     Status: None (Preliminary result)   Collection Time: 11/28/22  4:32 PM   Specimen: BLOOD  Result Value Ref Range Status   Specimen Description BLOOD LEFT [facility]  Final   Special Requests BOTTLES DRAWN AEROBIC AND ANAEROBIC  Final   Culture   Final    NO GROWTH 2 DAYS Performed at [facility], [address], [place], [place] [address]    Report Status PENDING  Incomplete  Resp panel by RT-PCR (RSV, Flu A&B, Covid) Anterior Nasal Swab     Status: None   Collection Time: 11/28/22  7:55 PM   Specimen: Anterior Nasal Swab  Result Value Ref Range Status   SARS Coronavirus 2 by RT PCR NEGATIVE  NEGATIVE Final    Comment: (NOTE) SARS-CoV-2 target nucleic acids are NOT DETECTED.  The SARS-CoV-2 RNA is generally detectable in upper respiratory specimens during the acute phase of infection. The lowest concentration of SARS-CoV-2 viral copies this assay can detect is 138 copies/mL. A negative result does not preclude SARS-Cov-2 infection and should not be used as the sole basis for treatment or other patient management decisions. A negative result may occur with  improper specimen collection/handling, submission of specimen other than nasopharyngeal swab, presence of viral mutation(s) within the areas targeted by this assay, and inadequate number of viral copies(<138 copies/mL). A negative result must be combined with clinical observations, patient history, and epidemiological information. The expected result is Negative.  Fact Sheet for Patients:  BloggerCourse.com  Fact Sheet for Healthcare Providers:  SeriousBroker.it  This test is no t yet approved or cleared by the [place] FDA and  has been authorized for detection and/or diagnosis of SARS-CoV-2 by FDA under an Emergency Use Authorization (EUA). This EUA will remain  in effect (meaning this test can be used) for the duration of the COVID-19 declaration under Section 564(b)(1) of the Act, 21 U.S.C.section 360bbb-3(b)(1), unless the authorization is terminated  or revoked sooner.       Influenza A by PCR NEGATIVE NEGATIVE Final   Influenza B by PCR NEGATIVE NEGATIVE Final    Comment: (NOTE) The Xpert Xpress SARS-CoV-2/FLU/RSV plus assay is intended as an aid in the diagnosis of influenza from Nasopharyngeal swab specimens and should not be used as a sole basis for treatment. Nasal washings and aspirates are unacceptable for Xpert Xpress SARS-CoV-2/FLU/RSV testing.  Fact Sheet for Patients: BloggerCourse.com  Fact Sheet for Healthcare  Providers: SeriousBroker.it  This test is not yet approved or cleared by the [place] FDA and has been authorized for detection and/or diagnosis of SARS-CoV-2 by FDA under an Emergency Use Authorization (EUA). This EUA will remain in effect (meaning this test can be used) for the duration of the COVID-19 declaration under Section 564(b)(1) of the Act, 21 U.S.C. section 360bbb-3(b)(1), unless the authorization is terminated or revoked.     Resp Syncytial Virus by PCR NEGATIVE NEGATIVE Final    Comment: (NOTE) Fact Sheet for Patients: BloggerCourse.com  Fact Sheet for Healthcare Providers: SeriousBroker.it  This test is not yet approved or cleared by the [place] FDA and has been authorized for detection and/or diagnosis of SARS-CoV-2 by FDA under an Emergency Use Authorization (EUA). This EUA will  remain in effect (meaning this test can be used) for the duration of the COVID-19 declaration under Section 564(b)(1) of the Act, 21 U.S.C. section 360bbb-3(b)(1), unless the authorization is terminated or revoked.  Performed at [facility], [address] [place], [place] [address]   Respiratory (~20 pathogens) panel by PCR     Status: Abnormal   Collection Time: 11/29/22  9:02 AM   Specimen: Nasopharyngeal Swab; Respiratory  Result Value Ref Range Status   Adenovirus NOT DETECTED NOT DETECTED Final   Coronavirus 229E NOT DETECTED NOT DETECTED Final    Comment: (NOTE) The Coronavirus on the Respiratory Panel, DOES NOT test for the novel  Coronavirus (2019 nCoV)    Coronavirus HKU1 NOT DETECTED NOT DETECTED Final   Coronavirus NL63 NOT DETECTED NOT DETECTED Final   Coronavirus OC43 NOT DETECTED NOT DETECTED Final   Metapneumovirus NOT DETECTED NOT DETECTED Final   Rhinovirus / Enterovirus NOT DETECTED NOT DETECTED Final   Influenza A NOT DETECTED NOT DETECTED Final   Influenza B NOT  DETECTED NOT DETECTED Final   Parainfluenza Virus 1 DETECTED (A) NOT DETECTED Final   Parainfluenza Virus 2 NOT DETECTED NOT DETECTED Final   Parainfluenza Virus 3 NOT DETECTED NOT DETECTED Final   Parainfluenza Virus 4 NOT DETECTED NOT DETECTED Final   Respiratory Syncytial Virus NOT DETECTED NOT DETECTED Final   Bordetella pertussis NOT DETECTED NOT DETECTED Final   Bordetella Parapertussis NOT DETECTED NOT DETECTED Final   Chlamydophila pneumoniae NOT DETECTED NOT DETECTED Final   Mycoplasma pneumoniae NOT DETECTED NOT DETECTED Final    Comment: Performed at [facility], [address] [place], [place] [address]  Expectorated Sputum Assessment w Gram Stain, Rflx to Resp Cult     Status: None   Collection Time: 11/29/22 12:15 PM   Specimen: Sputum  Result Value Ref Range Status   Specimen Description SPUTUM  Final   Special Requests EXPSU  Final   Sputum evaluation   Final    THIS SPECIMEN IS ACCEPTABLE FOR SPUTUM CULTURE Performed at [facility], [address] [place], [place] [address]    Report Status 11/29/2022 FINAL  Final  Culture, Respiratory w Gram Stain     Status: None (Preliminary result)   Collection Time: 11/29/22 12:15 PM   Specimen: SPU  Result Value Ref Range Status   Specimen Description   Final    SPUTUM Performed at [facility], [address], [place], [place] [address]    Special Requests   Final    EXPSU Reflexed from [id number] Performed at [facility], [address] [place], [place] [address]    Gram Stain   Final    ABUNDANT WBC PRESENT, PREDOMINANTLY PMN RARE GRAM POSITIVE COCCI IN PAIRS RARE GRAM POSITIVE COCCI IN CHAINS    Culture   Final    TOO YOUNG TO READ Performed at [facility], [address] [place], [place] [address]    Report Status PENDING  Incomplete         Radiology Studies: CT Angio Chest PE W and/or Wo Contrast  Result Date: 11/28/2022 CLINICAL DATA:  Difficulty  breathing for 10 days, wheezing, hypoxia, tachycardia EXAM: CT ANGIOGRAPHY CHEST WITH CONTRAST TECHNIQUE: Multidetector CT imaging of the chest was performed using the standard protocol during bolus administration of intravenous contrast. Multiplanar CT image reconstructions and MIPs were obtained to evaluate the vascular anatomy. RADIATION DOSE REDUCTION: This exam was performed according to the departmental dose-optimization  program which includes automated exposure control, adjustment of the mA and/or kV according to patient size and/or use of iterative reconstruction technique. CONTRAST:  75mL OMNIPAQUE IOHEXOL 350 MG/ML SOLN COMPARISON:  11/28/2022, 04/05/2016 FINDINGS: Cardiovascular: This is a technically adequate evaluation of the pulmonary vasculature. No filling defects or pulmonary emboli. The heart is unremarkable without pericardial effusion. No evidence of thoracic aortic aneurysm or dissection. Mediastinum/Nodes: No enlarged mediastinal, hilar, or axillary lymph nodes. Thyroid gland, trachea, and esophagus demonstrate no significant findings. Lungs/Pleura: Upper lobe predominant emphysema. There is bilateral bronchial wall thickening, with scattered tree in bud ground-glass airspace disease bilaterally, most pronounced in the left upper and right lower lobes. No other dense consolidation, effusion, or pneumothorax. The central airways are patent. Upper Abdomen: No acute abnormality. Musculoskeletal: No acute or destructive bony abnormalities. Reconstructed images demonstrate no additional findings. Review of the MIP images confirms the above findings. IMPRESSION: 1. No evidence of pulmonary embolus. 2. Bilateral bronchial wall thickening with scattered tree in bud ground-glass airspace opacities, consistent with bronchitis and mild bilateral bronchopneumonia. 3.  Emphysema (ICD10-J43.9). Electronically Signed   By: Sharlet Salina M.D.   On: 11/28/2022 18:22   DG Chest Port 1 View  Result Date:  11/28/2022 CLINICAL DATA:  Questionable sepsis. Evaluate for abnormality. Shortness of breath. EXAM: PORTABLE CHEST 1 VIEW COMPARISON:  Two-view chest x-ray 11/14/2015.  CT chest 04/05/2016. FINDINGS: The heart size and mediastinal contours are within normal limits. Both lungs are clear. The visualized skeletal structures are unremarkable. IMPRESSION: Negative one-view chest x-ray Electronically Signed   By: Marin Roberts M.D.   On: 11/28/2022 16:56        Scheduled Meds:  aspirin EC  81 mg Oral Daily   azithromycin  500 mg Oral QHS   baclofen  10 mg Oral TID   busPIRone  15 mg Oral BID   enoxaparin (LOVENOX) injection  40 mg Subcutaneous Q24H   influenza vaccine adjuvanted  0.5 mL Intramuscular Tomorrow-1000   insulin aspart  0-5 Units Subcutaneous QHS   insulin aspart  0-9 Units Subcutaneous TID WC   ipratropium-albuterol  3 mL Nebulization TID   linaclotide  145 mcg Oral QAC breakfast   methylPREDNISolone (SOLU-MEDROL) injection  40 mg Intravenous Q12H   nicotine  21 mg Transdermal Daily   pantoprazole  40 mg Oral Daily   senna-docusate  1 tablet Oral BID   traZODone  300 mg Oral QHS   venlafaxine XR  150 mg Oral Q breakfast   Continuous Infusions:  sodium chloride 75 mL/hr at 11/29/22 1315   cefTRIAXone (ROCEPHIN)  IV 2 g (11/29/22 2158)     LOS: 2 days    Silvano Bilis, MD [facility]   If 7PM-7AM, please contact night-coverage [email or web address] Password [email or web address] 11/30/2022, 2:56 PM

## 2022-11-30 NOTE — TOC Initial Note (Signed)
Transition of Care [facility]) - Initial/Assessment Note    Patient Details  Name: Sheri Barron MRN: [id number] Date of Birth: 11-19-1955  Transition of Care [facility]) CM/SW Contact:    Darolyn Rua, LCSW Phone Number: 11/30/2022, 3:11 PM  Clinical Narrative:                  CSW spoke with patient regarding SNF recs, patient is in agreement with no preference of SNF facility as long as it's in/near [place]. Referrals sent out for bed offers, pending bed offers at this time.   Expected Discharge Plan: Skilled Nursing Facility Barriers to Discharge: Continued Medical Work up   Patient Goals and CMS Choice Patient states their goals for this hospitalization and ongoing recovery are:: to go home CMS Medicare.gov Compare Post Acute Care list provided to:: Patient Choice offered to / list presented to : Patient      Expected Discharge Plan and Services       Living arrangements for the past 2 months: Single Family Home                                      Prior Living Arrangements/Services Living arrangements for the past 2 months: Single Family Home Lives with:: Self                   Activities of Daily Living   ADL Screening (condition at time of admission) Independently performs ADLs?: Yes (appropriate for developmental age) Is the patient deaf or have difficulty hearing?: No Does the patient have difficulty seeing, even when wearing glasses/contacts?: No Does the patient have difficulty concentrating, remembering, or making decisions?: No  Permission Sought/Granted                  Emotional Assessment       Orientation: : Oriented to Self, Oriented to Place, Oriented to  Time, Oriented to Situation Alcohol / Substance Use: Not Applicable Psych Involvement: No (comment)  Admission diagnosis:  COPD exacerbation (HCC) [J44.1] Community acquired pneumonia of right lung, unspecified part of lung [J18.9] Patient Active Problem List    Diagnosis Date Noted   Infection due to parainfluenza virus 1 11/30/2022   COPD exacerbation (HCC) 11/28/2022   CAP (community acquired pneumonia) 11/28/2022   Severe sepsis (HCC) 11/28/2022   HTN (hypertension) 11/28/2022   Diabetes mellitus without complication (HCC) 11/28/2022   Stroke (HCC) 11/28/2022   Depression with anxiety 11/28/2022   Tobacco abuse 11/28/2022   Acute on chronic respiratory failure with hypoxia and hypercapnia (HCC) 11/28/2022   Irritable bowel syndrome without diarrhea 05/17/2016   Hypokalemia 11/14/2015   Essential tremor 11/02/2013   PCP:  Abram Sander, MD Pharmacy:   [facility] [phone] - Closed - [place], [place] - [address] [place] [address] Phone: [phone] Fax: [phone]  [facility] Delivery - [place], [place] - [address] [place] [address] Phone: [phone] Fax: [phone]     Social Determinants of Health (SDOH) Social History: SDOH Screenings   Food Insecurity: No Food Insecurity (11/29/2022)  Housing: Low Risk  (11/29/2022)  Transportation Needs: No Transportation Needs (11/29/2022)  Utilities: Not At Risk (11/29/2022)  Tobacco Use: High Risk (11/29/2022)   SDOH Interventions:     Readmission Risk Interventions     No data to display

## 2022-11-30 NOTE — [place] FL2 (Signed)
[place] MEDICAID FL2 LEVEL OF CARE FORM     IDENTIFICATION  Patient Name: Sheri Barron Birthdate: 02/25/1955 Sex: female Admission Date (Current Location): 11/28/2022  [facility] and [place] Number:  Chiropodist and Address:  [facility], [address] [place], [place] [address]      Provider Number: [id number]  Attending Physician Name and Address:  Kathrynn Running, MD  Relative Name and Phone Number:  Burtis Junes (daughter)  [phone]    Current Level of Care: Hospital Recommended Level of Care: Skilled Nursing Facility Prior Approval Number:    Date Approved/Denied:   PASRR Number: [id number]  Discharge Plan: SNF    Current Diagnoses: Patient Active Problem List   Diagnosis Date Noted   Infection due to parainfluenza virus 1 11/30/2022   COPD exacerbation (HCC) 11/28/2022   CAP (community acquired pneumonia) 11/28/2022   Severe sepsis (HCC) 11/28/2022   HTN (hypertension) 11/28/2022   Diabetes mellitus without complication (HCC) 11/28/2022   Stroke (HCC) 11/28/2022   Depression with anxiety 11/28/2022   Tobacco abuse 11/28/2022   Acute on chronic respiratory failure with hypoxia and hypercapnia (HCC) 11/28/2022   Irritable bowel syndrome without diarrhea 05/17/2016   Hypokalemia 11/14/2015   Essential tremor 11/02/2013    Orientation RESPIRATION BLADDER Height & Weight     Self, Time, Situation, Place  O2 (6L nasal cannula) Incontinent, External catheter Weight: 140 lb 6.9 oz (63.7 kg) Height:  5' (152.4 cm)  BEHAVIORAL SYMPTOMS/MOOD NEUROLOGICAL BOWEL NUTRITION STATUS      Continent Diet (see discharge summary)  AMBULATORY STATUS COMMUNICATION OF NEEDS Skin   Extensive Assist Verbally Normal                       Personal Care Assistance Level of Assistance  Bathing, Dressing, Total care, Feeding Bathing Assistance: Maximum assistance Feeding assistance: Independent Dressing Assistance: Maximum  assistance Total Care Assistance: Maximum assistance   Functional Limitations Info  Sight, Hearing, Speech Sight Info: Adequate Hearing Info: Adequate Speech Info: Adequate    SPECIAL CARE FACTORS FREQUENCY  PT (By licensed PT), OT (By licensed OT)     PT Frequency: min 4x weekly OT Frequency: min 4x weekly            Contractures Contractures Info: Not present    Additional Factors Info  Allergies, Code Status Code Status Info: dnr limited Allergies Info: sulfa antibiotics           Current Medications (11/30/2022):  This is the current hospital active medication list Current Facility-Administered Medications  Medication Dose Route Frequency Provider Last Rate Last Admin   acetaminophen (TYLENOL) tablet 650 mg  650 mg Oral Q6H PRN Lorretta Harp, MD   650 mg at 11/29/22 0918   albuterol (PROVENTIL) (2.5 MG/3ML) 0.083% nebulizer solution 2.5 mg  2.5 mg Nebulization Q2H PRN Kathrynn Running, MD   2.5 mg at 11/29/22 1245   aspirin EC tablet 81 mg  81 mg Oral Daily Kathrynn Running, MD   81 mg at 11/30/22 0857   baclofen (LIORESAL) tablet 10 mg  10 mg Oral TID Kathrynn Running, MD   10 mg at 11/30/22 0857   busPIRone (BUSPAR) tablet 15 mg  15 mg Oral BID Lorretta Harp, MD   15 mg at 11/30/22 0857   cefTRIAXone (ROCEPHIN) 2 g in sodium chloride 0.9 % 100 mL IVPB  2 g Intravenous Q24H Sharman Cheek, MD 200 mL/hr at 11/29/22 2158 2 g at  11/29/22 2158   dextromethorphan-guaiFENesin (MUCINEX DM) 30-600 MG per 12 hr tablet 1 tablet  1 tablet Oral BID PRN Lorretta Harp, MD   1 tablet at 11/29/22 1245   enoxaparin (LOVENOX) injection 40 mg  40 mg Subcutaneous Q24H Lorretta Harp, MD   40 mg at 11/29/22 2158   gabapentin (NEURONTIN) capsule 100 mg  100 mg Oral TID PRN Kathrynn Running, MD       influenza vaccine adjuvanted (FLUAD) injection 0.5 mL  0.5 mL Intramuscular Tomorrow-1000 Wouk, Wilfred Curtis, MD       insulin aspart (novoLOG) injection 0-5 Units  0-5 Units Subcutaneous QHS  Lorretta Harp, MD       insulin aspart (novoLOG) injection 0-9 Units  0-9 Units Subcutaneous TID WC Lorretta Harp, MD   2 Units at 11/30/22 1248   insulin glargine-yfgn (SEMGLEE) injection 5 Units  5 Units Subcutaneous QHS Wouk, Wilfred Curtis, MD       ipratropium-albuterol (DUONEB) 0.5-2.5 (3) MG/3ML nebulizer solution 3 mL  3 mL Nebulization TID Kathrynn Running, MD   3 mL at 11/30/22 1352   linaclotide (LINZESS) capsule 145 mcg  145 mcg Oral QAC breakfast Lorretta Harp, MD   145 mcg at 11/30/22 0857   methylPREDNISolone sodium succinate (SOLU-MEDROL) 40 mg/mL injection 40 mg  40 mg Intravenous Q12H Wouk, Wilfred Curtis, MD   40 mg at 11/30/22 1247   nicotine (NICODERM CQ - dosed in mg/24 hours) patch 21 mg  21 mg Transdermal Daily Lorretta Harp, MD   21 mg at 11/30/22 0901   ondansetron (ZOFRAN) injection 4 mg  4 mg Intravenous Q8H PRN Lorretta Harp, MD       pantoprazole (PROTONIX) EC tablet 40 mg  40 mg Oral Daily Lorretta Harp, MD   40 mg at 11/30/22 0857   polyethylene glycol (MIRALAX / GLYCOLAX) packet 17 g  17 g Oral Daily PRN Lorretta Harp, MD       propranolol (INDERAL) tablet 40 mg  40 mg Oral BID Wouk, Wilfred Curtis, MD       senna-docusate (Senokot-S) tablet 1 tablet  1 tablet Oral BID Lorretta Harp, MD   1 tablet at 11/30/22 0857   traZODone (DESYREL) tablet 300 mg  300 mg Oral QHS Lorretta Harp, MD   300 mg at 11/29/22 2159   venlafaxine XR (EFFEXOR-XR) 24 hr capsule 150 mg  150 mg Oral Q breakfast Lorretta Harp, MD   150 mg at 11/30/22 1610     Discharge Medications: Please see discharge summary for a list of discharge medications.  Relevant Imaging Results:  Relevant Lab Results:   Additional Information SSN: [id number]  Darolyn Rua, LCSW

## 2022-11-30 NOTE — Plan of Care (Signed)
  Problem: Education: Goal: Knowledge of General Education information will improve Description: Including pain rating scale, medication(s)/side effects and non-pharmacologic comfort measures Outcome: Progressing   Problem: Clinical Measurements: Goal: Respiratory complications will improve Outcome: Progressing   Problem: Clinical Measurements: Goal: Cardiovascular complication will be avoided Outcome: Progressing   Problem: Elimination: Goal: Will not experience complications related to bowel motility Outcome: Progressing   Problem: Elimination: Goal: Will not experience complications related to urinary retention Outcome: Progressing   Problem: Pain Management: Goal: General experience of comfort will improve Outcome: Progressing   Problem: Safety: Goal: Ability to remain free from injury will improve Outcome: Progressing

## 2022-12-01 DIAGNOSIS — J441 Chronic obstructive pulmonary disease with (acute) exacerbation: Secondary | ICD-10-CM | POA: Diagnosis not present

## 2022-12-01 LAB — CULTURE, RESPIRATORY W GRAM STAIN: Culture: NORMAL

## 2022-12-01 LAB — BASIC METABOLIC PANEL
Anion gap: 5 (ref 5–15)
BUN: 23 mg/dL (ref 8–23)
CO2: 32 mmol/L (ref 22–32)
Calcium: 9 mg/dL (ref 8.9–10.3)
Chloride: 105 mmol/L (ref 98–111)
Creatinine, Ser: 0.79 mg/dL (ref 0.44–1.00)
GFR, Estimated: >60 mL/min (ref >60)
Glucose, Bld: 122 mg/dL — ABNORMAL HIGH (ref 70–99)
Potassium: 4 mmol/L (ref 3.5–5.1)
Sodium: 142 mmol/L (ref 135–145)

## 2022-12-01 LAB — LEGIONELLA PNEUMOPHILA SEROGP 1 UR AG: L. pneumophila Serogp 1 Ur Ag: NEGATIVE

## 2022-12-01 LAB — GLUCOSE, CAPILLARY
Glucose-Capillary: 118 mg/dL — ABNORMAL HIGH (ref 70–99)
Glucose-Capillary: 105 mg/dL — ABNORMAL HIGH (ref 70–99)
Glucose-Capillary: 116 mg/dL — ABNORMAL HIGH (ref 70–99)
Glucose-Capillary: 92 mg/dL (ref 70–99)
Glucose-Capillary: 107 mg/dL — ABNORMAL HIGH (ref 70–99)

## 2022-12-01 LAB — CBC
HCT: 29.8 % — ABNORMAL LOW (ref 36.0–46.0)
Hemoglobin: 9.6 g/dL — ABNORMAL LOW (ref 12.0–15.0)
MCH: 28.6 pg (ref 26.0–34.0)
MCHC: 32.2 g/dL (ref 30.0–36.0)
MCV: 88.7 fL (ref 80.0–100.0)
Platelets: 195 10*3/uL (ref 150–400)
RBC: 3.36 MIL/uL — ABNORMAL LOW (ref 3.87–5.11)
RDW: 14.3 % (ref 11.5–15.5)
WBC: 5.2 10*3/uL (ref 4.0–10.5)
nRBC: 0 % (ref 0.0–0.2)

## 2022-12-01 MED ORDER — PREDNISONE 20 MG PO TABS
40.0000 mg | ORAL_TABLET | Freq: Every day | ORAL | Status: DC
  Administered 2022-12-02 – 2022-12-05 (×4): 40 mg via ORAL
  Filled 2022-12-01 (×4): qty 2

## 2022-12-01 MED ORDER — POLYVINYL ALCOHOL 1.4 % OP SOLN
1.0000 [drp] | OPHTHALMIC | Status: DC | PRN
  Administered 2022-12-02 – 2022-12-04 (×4): 1 [drp] via OPHTHALMIC
  Filled 2022-12-01: qty 15

## 2022-12-01 NOTE — Progress Notes (Addendum)
PROGRESS NOTE    Sheri Barron  QVZ:[id number] DOB: 12-28-1955 DOA: 11/28/2022 PCP: Abram Sander, MD     Brief Narrative:  Sheri Barron is a 67 y.o. female with medical history significant of asthma, COPD on 2 L oxygen at night, HTN, HLD, stroke, depression with anxiety, chronic constipation, tobacco abuse, who presents with shortness of breath.   Patient states that she has shortness of breath for more than 10 days, which has been progressively worsening.  Patient has dry cough and wheezing.  Denies chest pain.  No fever or chills.  Patient is constipated chronically. She has nausea, no vomiting or abdominal pain.  Patient reports symptoms of UTI, including dysuria, burning on urination and increased urinary frequency.  No fever or chills.   Patient is using 2 L oxygen only at night at home.  Patient is found to have severe respiratory distress, oxygen desaturation to 80s% on room air, initially started on 5 L oxygen, still has respiratory distress, cannot speak in full sentence.  BiPAP is started in ED.   Assessment & Plan:   Principal Problem:   COPD exacerbation (HCC) Active Problems:   Acute on chronic respiratory failure with hypoxia and hypercapnia (HCC)   CAP (community acquired pneumonia)   Severe sepsis (HCC)   HTN (hypertension)   Diabetes mellitus without complication (HCC)   Stroke (HCC)   Depression with anxiety   Tobacco abuse   Irritable bowel syndrome without diarrhea   Infection due to parainfluenza virus 1  # COPD with acute exacerbation # Parainfluenza virus 1 infection Several weeks worsening dyspnea, cough. Parainfluenza 1 positive. Requiring intermittent bipap at night but overall reports progress. Received 3 days steroids as outpatient, no recent abx. Blood cultures ngtd, sputum culture no abnormal growth. - continue steroids (transition to orals tomorrow), abx (ceftriaxone), breathing treatments  # Acute hypoxic hypercarbic respiratory  failure Presented with respiratory distress, hypercarbia. Requiring inermittent bipap - bipap prn - titrate Meno o2 to 88-92  # CAP CTA no PE but shows signs bronchitis and b/l bronchopneumonia. Procal is, however, low. Covid/flu/rsv neg. No LE swelling or pulm edema to suggest chf exacerbation, no chest pain to suggest acs. Cultures neg. - continue abx, at a minimum the will have anti-inflammatory properties that will help COPD  # Sepsis, severe By elevated lactate, tachycardia and tachypnea. Sepisis physiology resolved, now off fluids  # Loose BMs Several today per RN. No blood or fever or leukocytosis. Is on abx but also on stool softeners - hold sennakot, linzess, prn miralax - further w/u if persists particularly if any alarm signs/symptoms  # Debility Ambulates w/ a cane at baseline, lives alone - PT advising snf, toc consulted, bed search undreway  # Chronic back pain - home baclofen, gabapentin  # Tremor - home propranolol  # IBS - home linzess on hold as above  # HTN Bp improved today, wnl - hold home lisinopril/hydrochlorothiazide  # Hx CVA - cont home aspirin (but will reduce dose from 325 to 81)  # GAD - cont home buspar, trazodone, effexor  # T2DM Glucose controlled - hold home metformin - SSI - cont semglee 5   DVT prophylaxis: lovenox Code Status: DNR Family Communication: daughter Burtis Junes updated telephonically 11/20  Level of care: Progressive Status is: Inpatient Remains inpatient appropriate because: severity of illness    Consultants:  none  Procedures: none  Antimicrobials:  Ceftriaxone/azithromycin> ceftriaxone   Subjective: Feeling improving, cough persists, bm this morning  Objective: Vitals:  12/01/22 0517 12/01/22 0746 12/01/22 0757 12/01/22 1227  BP: 122/60  (!) 155/72 (!) 130/104  Pulse: 70  (!) 58 72  Resp:   16 16  Temp: 97.8 F (36.6 C)  97.9 F (36.6 C) 98.1 F (36.7 C)  TempSrc: Oral     SpO2: 100% 98%  100% 98%  Weight:      Height:        Intake/Output Summary (Last 24 hours) at 12/01/2022 1229 Last data filed at 12/01/2022 0700 Gross per 24 hour  Intake 440 ml  Output 800 ml  Net -360 ml   Filed Weights   11/28/22 1636 11/29/22 1900  Weight: 61.2 kg 63.7 kg    Examination:  General exam: Appears chronically ill, NAD Respiratory system: exp wheeze and rhonchi throughout Cardiovascular system: S1 & S2 heard, RRR. Distant heart sounds Gastrointestinal system: Abdomen is nondistended, soft and nontender. No organomegaly or masses felt.   Central nervous system: Alert and oriented. No focal neurological deficits. Rest tremor Extremities: Symmetric 5 x 5 power. No edema, warm Skin: No rashes, lesions or ulcers Psychiatry: Judgement and insight appear normal. Mood & affect appropriate.     Data Reviewed: I have personally reviewed following labs and imaging studies  CBC: Recent Labs  Lab 11/28/22 1630 11/29/22 0222 12/01/22 0557  WBC 8.4 4.3 5.2  NEUTROABS 5.4  --   --   HGB 11.9* 10.5* 9.6*  HCT 38.9 34.1* 29.8*  MCV 91.7 92.9 88.7  PLT 174 168 195   Basic Metabolic Panel: Recent Labs  Lab 11/28/22 1630 11/29/22 0222 12/01/22 0557  NA 139 137 142  K 4.1 4.2 4.0  CL 94* 97* 105  CO2 34* 31 32  GLUCOSE 102* 156* 122*  BUN 22 21 23   CREATININE 1.01* 0.95 0.79  CALCIUM 8.7* 8.0* 9.0  MG 1.7  --   --    GFR: Estimated Creatinine Clearance: 57.7 mL/min (by C-G formula based on SCr of 0.79 mg/dL). Liver Function Tests: Recent Labs  Lab 11/28/22 1630  AST 16  ALT 15  ALKPHOS 73  BILITOT 0.7  PROT 7.2  ALBUMIN 3.7   No results for input(s): "LIPASE", "AMYLASE" in the last 168 hours. No results for input(s): "AMMONIA" in the last 168 hours. Coagulation Profile: No results for input(s): "INR", "PROTIME" in the last 168 hours. Cardiac Enzymes: No results for input(s): "CKTOTAL", "CKMB", "CKMBINDEX", "TROPONINI" in the last 168 hours. BNP (last 3  results) No results for input(s): "PROBNP" in the last 8760 hours. HbA1C: Recent Labs    11/28/22 1632  HGBA1C 6.2*   CBG: Recent Labs  Lab 11/30/22 1715 11/30/22 2042 12/01/22 0808 12/01/22 0902 12/01/22 1223  GLUCAP 116* 114* 118* 105* 116*   Lipid Profile: No results for input(s): "CHOL", "HDL", "LDLCALC", "TRIG", "CHOLHDL", "LDLDIRECT" in the last 72 hours. Thyroid Function Tests: No results for input(s): "TSH", "T4TOTAL", "FREET4", "T3FREE", "THYROIDAB" in the last 72 hours. Anemia Panel: No results for input(s): "VITAMINB12", "FOLATE", "FERRITIN", "TIBC", "IRON", "RETICCTPCT" in the last 72 hours. Urine analysis:    Component Value Date/Time   COLORURINE YELLOW (A) 11/28/2022 1850   APPEARANCEUR CLEAR (A) 11/28/2022 1850   LABSPEC 1.039 (H) 11/28/2022 1850   PHURINE 5.0 11/28/2022 1850   GLUCOSEU NEGATIVE 11/28/2022 1850   HGBUR NEGATIVE 11/28/2022 1850   BILIRUBINUR NEGATIVE 11/28/2022 1850   KETONESUR 20 (A) 11/28/2022 1850   PROTEINUR NEGATIVE 11/28/2022 1850   NITRITE NEGATIVE 11/28/2022 1850   LEUKOCYTESUR NEGATIVE 11/28/2022 1850  Sepsis Labs: @LABRCNTIP (procalcitonin:4,lacticidven:4)  ) Recent Results (from the past 240 hour(s))  Blood Culture (routine x 2)     Status: None (Preliminary result)   Collection Time: 11/28/22  4:32 PM   Specimen: BLOOD  Result Value Ref Range Status   Specimen Description BLOOD RIGHT [facility]  Final   Special Requests BOTTLES DRAWN AEROBIC AND ANAEROBIC  Final   Culture   Final    NO GROWTH 3 DAYS Performed at [facility], [address] [place], [place] [address]    Report Status PENDING  Incomplete  Blood Culture (routine x 2)     Status: None (Preliminary result)   Collection Time: 11/28/22  4:32 PM   Specimen: BLOOD  Result Value Ref Range Status   Specimen Description BLOOD LEFT AC  Final   Special Requests BOTTLES DRAWN AEROBIC AND ANAEROBIC  Final   Culture   Final    NO GROWTH 3 DAYS Performed at  [facility], [address], [place], [place] [address]    Report Status PENDING  Incomplete  Resp panel by RT-PCR (RSV, Flu A&B, Covid) Anterior Nasal Swab     Status: None   Collection Time: 11/28/22  7:55 PM   Specimen: Anterior Nasal Swab  Result Value Ref Range Status   SARS Coronavirus 2 by RT PCR NEGATIVE NEGATIVE Final    Comment: (NOTE) SARS-CoV-2 target nucleic acids are NOT DETECTED.  The SARS-CoV-2 RNA is generally detectable in upper respiratory specimens during the acute phase of infection. The lowest concentration of SARS-CoV-2 viral copies this assay can detect is 138 copies/mL. A negative result does not preclude SARS-Cov-2 infection and should not be used as the sole basis for treatment or other patient management decisions. A negative result may occur with  improper specimen collection/handling, submission of specimen other than nasopharyngeal swab, presence of viral mutation(s) within the areas targeted by this assay, and inadequate number of viral copies(<138 copies/mL). A negative result must be combined with clinical observations, patient history, and epidemiological information. The expected result is Negative.  Fact Sheet for Patients:  BloggerCourse.com  Fact Sheet for Healthcare Providers:  SeriousBroker.it  This test is no t yet approved or cleared by the [place] FDA and  has been authorized for detection and/or diagnosis of SARS-CoV-2 by FDA under an Emergency Use Authorization (EUA). This EUA will remain  in effect (meaning this test can be used) for the duration of the COVID-19 declaration under Section 564(b)(1) of the Act, 21 U.S.C.section 360bbb-3(b)(1), unless the authorization is terminated  or revoked sooner.       Influenza A by PCR NEGATIVE NEGATIVE Final   Influenza B by PCR NEGATIVE NEGATIVE Final    Comment: (NOTE) The Xpert Xpress SARS-CoV-2/FLU/RSV plus assay is  intended as an aid in the diagnosis of influenza from Nasopharyngeal swab specimens and should not be used as a sole basis for treatment. Nasal washings and aspirates are unacceptable for Xpert Xpress SARS-CoV-2/FLU/RSV testing.  Fact Sheet for Patients: BloggerCourse.com  Fact Sheet for Healthcare Providers: SeriousBroker.it  This test is not yet approved or cleared by the [place] FDA and has been authorized for detection and/or diagnosis of SARS-CoV-2 by FDA under an Emergency Use Authorization (EUA). This EUA will remain in effect (meaning this test can be used) for the duration of the COVID-19 declaration under Section 564(b)(1) of the Act, 21 U.S.C. section 360bbb-3(b)(1), unless the authorization is terminated or revoked.     Resp Syncytial Virus by PCR  NEGATIVE NEGATIVE Final    Comment: (NOTE) Fact Sheet for Patients: BloggerCourse.com  Fact Sheet for Healthcare Providers: SeriousBroker.it  This test is not yet approved or cleared by the [place] FDA and has been authorized for detection and/or diagnosis of SARS-CoV-2 by FDA under an Emergency Use Authorization (EUA). This EUA will remain in effect (meaning this test can be used) for the duration of the COVID-19 declaration under Section 564(b)(1) of the Act, 21 U.S.C. section 360bbb-3(b)(1), unless the authorization is terminated or revoked.  Performed at [facility], [address] [place], [place] [address]   Respiratory (~20 pathogens) panel by PCR     Status: Abnormal   Collection Time: 11/29/22  9:02 AM   Specimen: Nasopharyngeal Swab; Respiratory  Result Value Ref Range Status   Adenovirus NOT DETECTED NOT DETECTED Final   Coronavirus 229E NOT DETECTED NOT DETECTED Final    Comment: (NOTE) The Coronavirus on the Respiratory Panel, DOES NOT test for the novel  Coronavirus (2019 nCoV)     Coronavirus HKU1 NOT DETECTED NOT DETECTED Final   Coronavirus NL63 NOT DETECTED NOT DETECTED Final   Coronavirus OC43 NOT DETECTED NOT DETECTED Final   Metapneumovirus NOT DETECTED NOT DETECTED Final   Rhinovirus / Enterovirus NOT DETECTED NOT DETECTED Final   Influenza A NOT DETECTED NOT DETECTED Final   Influenza B NOT DETECTED NOT DETECTED Final   Parainfluenza Virus 1 DETECTED (A) NOT DETECTED Final   Parainfluenza Virus 2 NOT DETECTED NOT DETECTED Final   Parainfluenza Virus 3 NOT DETECTED NOT DETECTED Final   Parainfluenza Virus 4 NOT DETECTED NOT DETECTED Final   Respiratory Syncytial Virus NOT DETECTED NOT DETECTED Final   Bordetella pertussis NOT DETECTED NOT DETECTED Final   Bordetella Parapertussis NOT DETECTED NOT DETECTED Final   Chlamydophila pneumoniae NOT DETECTED NOT DETECTED Final   Mycoplasma pneumoniae NOT DETECTED NOT DETECTED Final    Comment: Performed at [facility], [address], [place], [place] [address]  Expectorated Sputum Assessment w Gram Stain, Rflx to Resp Cult     Status: None   Collection Time: 11/29/22 12:15 PM   Specimen: Sputum  Result Value Ref Range Status   Specimen Description SPUTUM  Final   Special Requests EXPSU  Final   Sputum evaluation   Final    THIS SPECIMEN IS ACCEPTABLE FOR SPUTUM CULTURE Performed at [facility], [address], [place], [place] [address]    Report Status 11/29/2022 FINAL  Final  Culture, Respiratory w Gram Stain     Status: None   Collection Time: 11/29/22 12:15 PM   Specimen: SPU  Result Value Ref Range Status   Specimen Description   Final    SPUTUM Performed at [facility], [address] [place], [place] [address]    Special Requests   Final    EXPSU Reflexed from [id number] Performed at [facility], [address] [place], [place] [address]    Gram Stain   Final    ABUNDANT WBC PRESENT, PREDOMINANTLY PMN RARE GRAM POSITIVE COCCI IN PAIRS RARE GRAM POSITIVE  COCCI IN CHAINS    Culture   Final    Normal respiratory flora-no Staph aureus or Pseudomonas seen Performed at [facility], [address] [place], [place] [address]    Report Status 12/01/2022 FINAL  Final         Radiology Studies: No results found.      Scheduled Meds:  aspirin EC  81  mg Oral Daily   baclofen  10 mg Oral TID   busPIRone  15 mg Oral BID   enoxaparin (LOVENOX) injection  40 mg Subcutaneous Q24H   influenza vaccine adjuvanted  0.5 mL Intramuscular Tomorrow-1000   insulin aspart  0-5 Units Subcutaneous QHS   insulin aspart  0-9 Units Subcutaneous TID WC   insulin glargine-yfgn  5 Units Subcutaneous QHS   ipratropium-albuterol  3 mL Nebulization TID   linaclotide  145 mcg Oral QAC breakfast   nicotine  21 mg Transdermal Daily   pantoprazole  40 mg Oral Daily   [START ON 12/02/2022] predniSONE  40 mg Oral Q breakfast   propranolol  40 mg Oral BID   senna-docusate  1 tablet Oral BID   traZODone  300 mg Oral QHS   venlafaxine XR  150 mg Oral Q breakfast   Continuous Infusions:  cefTRIAXone (ROCEPHIN)  IV Stopped (11/30/22 1822)     LOS: 3 days    Silvano Bilis, MD [facility]   If 7PM-7AM, please contact night-coverage [email or web address] Password [facility] 12/01/2022, 12:29 PM

## 2022-12-01 NOTE — TOC Progression Note (Signed)
Transition of Care [facility]) - Progression Note    Patient Details  Name: Sheri Barron MRN: [id number] Date of Birth: July 18, 1955  Transition of Care [facility]) CM/SW Contact  Darolyn Rua, [place] Phone Number: 12/01/2022, 1:27 PM  Clinical Narrative:     Patient chose [facility] insurance auth started.   Expected Discharge Plan: Skilled Nursing Facility Barriers to Discharge: Continued Medical Work up  Expected Discharge Plan and Services       Living arrangements for the past 2 months: Single Family Home                                       Social Determinants of Health (SDOH) Interventions SDOH Screenings   Food Insecurity: No Food Insecurity (11/29/2022)  Housing: Low Risk  (11/29/2022)  Transportation Needs: No Transportation Needs (11/29/2022)  Utilities: Not At Risk (11/29/2022)  Tobacco Use: High Risk (11/29/2022)    Readmission Risk Interventions     No data to display

## 2022-12-01 NOTE — Care Management Important Message (Signed)
Important Message  Patient Details  Name: Sheri Barron MRN: [id number] Date of Birth: 1955/11/15   Important Message Given:  N/A - LOS <3 / Initial given by admissions     [name] 12/01/2022, 10:58 AM

## 2022-12-02 DIAGNOSIS — J441 Chronic obstructive pulmonary disease with (acute) exacerbation: Secondary | ICD-10-CM

## 2022-12-02 LAB — CBC
HCT: 31.7 % — ABNORMAL LOW (ref 36.0–46.0)
Hemoglobin: 10 g/dL — ABNORMAL LOW (ref 12.0–15.0)
MCH: 28.2 pg (ref 26.0–34.0)
MCHC: 31.5 g/dL (ref 30.0–36.0)
MCV: 89.3 fL (ref 80.0–100.0)
Platelets: 189 10*3/uL (ref 150–400)
RBC: 3.55 MIL/uL — ABNORMAL LOW (ref 3.87–5.11)
RDW: 14.1 % (ref 11.5–15.5)
WBC: 5.7 10*3/uL (ref 4.0–10.5)
nRBC: 0 % (ref 0.0–0.2)

## 2022-12-02 LAB — GLUCOSE, CAPILLARY
Glucose-Capillary: 91 mg/dL (ref 70–99)
Glucose-Capillary: 131 mg/dL — ABNORMAL HIGH (ref 70–99)
Glucose-Capillary: 152 mg/dL — ABNORMAL HIGH (ref 70–99)
Glucose-Capillary: 116 mg/dL — ABNORMAL HIGH (ref 70–99)

## 2022-12-02 LAB — BASIC METABOLIC PANEL
Anion gap: 7 (ref 5–15)
BUN: 23 mg/dL (ref 8–23)
CO2: 29 mmol/L (ref 22–32)
Calcium: 8.6 mg/dL — ABNORMAL LOW (ref 8.9–10.3)
Chloride: 104 mmol/L (ref 98–111)
Creatinine, Ser: 0.95 mg/dL (ref 0.44–1.00)
GFR, Estimated: >60 mL/min (ref >60)
Glucose, Bld: 84 mg/dL (ref 70–99)
Potassium: 3.8 mmol/L (ref 3.5–5.1)
Sodium: 140 mmol/L (ref 135–145)

## 2022-12-02 MED ORDER — DM-GUAIFENESIN ER 30-600 MG PO TB12
2.0000 | ORAL_TABLET | Freq: Two times a day (BID) | ORAL | Status: DC
  Administered 2022-12-02 – 2022-12-05 (×6): 2 via ORAL
  Filled 2022-12-02 (×7): qty 2

## 2022-12-02 NOTE — Care Management Important Message (Signed)
Important Message  Patient Details  Name: Sheri Barron MRN: [id number] Date of Birth: 08/14/1955   Important Message Given:  Yes - Medicare IM  Patient is in an isolation room so I reviewed her Important Message from Medicare with her by phone [phone]). She is in agreement with her upcoming discharge and I wished her a speedy recovery and thanked her for her time.   [name] A Makiah Foye 12/02/2022, 10:01 AM

## 2022-12-02 NOTE — TOC Progression Note (Signed)
Transition of Care [facility]) - Progression Note    Patient Details  Name: Sheri Barron MRN: [id number] Date of Birth: 04-23-1955  Transition of Care [facility]) CM/SW Contact  Darolyn Rua, [place] Phone Number: 12/02/2022, 12:24 PM  Clinical Narrative:     Ins[facility] auth achieved for [facility] Auth ID: [id number] Auth ID : [id number]  Approved from 11/21-11/25  MD reports potential dc in next 1-2 days, [facility] updated. She confirms they will be able to accept over the weekend if needed.   Expected Discharge Plan: Skilled Nursing Facility Barriers to Discharge: Continued Medical Work up  Expected Discharge Plan and Services       Living arrangements for the past 2 months: Single Family Home                                       Social Determinants of Health (SDOH) Interventions SDOH Screenings   Food Insecurity: No Food Insecurity (11/29/2022)  Housing: Low Risk  (11/29/2022)  Transportation Needs: No Transportation Needs (11/29/2022)  Utilities: Not At Risk (11/29/2022)  Tobacco Use: High Risk (11/29/2022)    Readmission Risk Interventions     No data to display

## 2022-12-02 NOTE — Plan of Care (Signed)
  Problem: Education: Goal: Knowledge of General Education information will improve Description: Including pain rating scale, medication(s)/side effects and non-pharmacologic comfort measures Outcome: Progressing   Problem: Clinical Measurements: Goal: Respiratory complications will improve Outcome: Progressing   Problem: Clinical Measurements: Goal: Cardiovascular complication will be avoided Outcome: Progressing   Problem: Activity: Goal: Risk for activity intolerance will decrease Outcome: Progressing   Problem: Elimination: Goal: Will not experience complications related to bowel motility Outcome: Progressing   Problem: Elimination: Goal: Will not experience complications related to urinary retention Outcome: Progressing   Problem: Pain Management: Goal: General experience of comfort will improve Outcome: Progressing   Problem: Safety: Goal: Ability to remain free from injury will improve Outcome: Progressing

## 2022-12-02 NOTE — Hospital Course (Addendum)
HPI: Sheri Barron is a 67 y.o. female with medical history significant of asthma, COPD on 2 L oxygen at night, HTN, HLD, stroke, depression with anxiety, chronic constipation, tobacco abuse, who presents with shortness of breath more than 10 days, which has been progressively worsening. Patient has dry cough and wheezing.   Hospital course / significant events:  11/17: admitted to hospitalist service for COPD exacerbation / sepsis CAP 11/18: maintaining on IV fluids pending cultures  11/19-11/21: BiPap at bedtime, saturating well on 4L [place]. Pending SNF placement  11/22: [facility] can take her this weekend, working on tapering down O2  11/23: d.c orders in but [facility] cannot take her, will need to wait to [facility] 11/24: pt actually doing pretty well today, reports feeling a lot stronger, possibly another 1-2 days PT and can go home w/ HH. Ambulate as tolerated w/ assistance     Consultants:  none  Procedures/Surgeries: none      ASSESSMENT & PLAN:   COPD with acute exacerbation Parainfluenza virus 1 infection Requiring intermittent bipap at night but overall reports improvement.  Received 3 days steroids as outpatient, no recent abx.  Blood cultures ngtd, sputum culture no abnormal growth. steroids slow taper abx (ceftriaxone) completed  breathing treatments Taper O2 to baseline as able, still higher O2 requirement on ambulation but improving   Globus sensation Scheduled expectorants and breathing tx improved this,    Acute hypoxic hypercarbic respiratory failure Chronic hypoxic respiratory failure on 2L O2 [place] at home  titrate [place] o2 to 88-92   CAP CTA no PE but shows signs bronchitis and b/l bronchopneumonia. Procal is, however, low. Covid/flu/rsv neg. No LE swelling or pulm edema to suggest chf exacerbation, no chest pain to suggest acs. Cultures neg. Completed abx   Sepsis, severe d/t parainfluenza virus vs CAP Possible SIRS d/t COPD exacerbation  By elevated lactate,  tachycardia and tachypnea. Sepisis physiology resolved, now off fluids Monitor    Loose BMs No blood or fever or leukocytosis. Is on abx but also on stool softeners hold sennakot, linzess, prn miralax further w/u if persists particularly if any alarm signs/symptoms   Debility Ambulates w/ a cane at baseline, lives alone SNF rehab    Chronic back pain Meds as below    Tremor home propranolol   IBS home linzess on hold restart outpatient as needed    HTN Bp improved, wnl hold home lisinopril/hydrochlorothiazide Follow outpatient    Hx CVA cont aspirin reduce dose from 325 to 81   GAD cont home buspar, trazodone, effexor   T2DM Glucose controlled Resume home metformin          overweight based on BMI: Body mass index is 27.43 kg/m.  Underweight - under 18.5  normal weight - 18.5 to 24.9 overweight - 25 to 29.9 obese - 30 or more   DVT prophylaxis: lovenox IV fluids: no continuous IV fluids  Nutrition: carb/cardiac  Central lines / invasive devices: none  Code Status: DNR ACP documentation reviewed: 12/05/22 and none on file in [facility]  TOC needs: SNF placement vs home tomorrow  Barriers to dispo / significant pending items:  still higher O2 requirement on ambulation but improving, anticipate d/c tomorrow

## 2022-12-02 NOTE — Progress Notes (Signed)
PROGRESS NOTE    [name]   ZOX:[id number] DOB: June 14, 1955  DOA: 11/28/2022 Date of Service: 12/02/22 which is hospital day 4  PCP: Abram Sander, MD    HPI: Sheri Barron is a 67 y.o. female with medical history significant of asthma, COPD on 2 L oxygen at night, HTN, HLD, stroke, depression with anxiety, chronic constipation, tobacco abuse, who presents with shortness of breath more than 10 days, which has been progressively worsening. Patient has dry cough and wheezing.   Hospital course / significant events:  11/17: admitted to hospitalist service for COPD exacerbation / sepsis CAP 11/18: maintaining on IV fluids pending cultures  11/19-11/21: BiPap at bedtime, saturating well on 4L [place]. Pending SNF placement     Consultants:  none  Procedures/Surgeries: none      ASSESSMENT & PLAN:   COPD with acute exacerbation Parainfluenza virus 1 infection Requiring intermittent bipap at night but overall reports improvement.  Received 3 days steroids as outpatient, no recent abx.  Blood cultures ngtd, sputum culture no abnormal growth. steroids (transition to orals today) abx (ceftriaxone) breathing treatments Taper O2 to baseline as able   Globus sensation Scheduled expectorants and breathing tx for today    Acute hypoxic hypercarbic respiratory failure Chronic hypoxic respiratory failure on 2L O2 [place] at home  bipap prn titrate [place] o2 to 88-92   CAP CTA no PE but shows signs bronchitis and b/l bronchopneumonia. Procal is, however, low. Covid/flu/rsv neg. No LE swelling or pulm edema to suggest chf exacerbation, no chest pain to suggest acs. Cultures neg. continue abx, at a minimum the will have anti-inflammatory properties that will help COPD   Sepsis, severe d/t parainfluenza virus vs CAP Possible SIRS d/t COPD exacerbation  By elevated lactate, tachycardia and tachypnea. Sepisis physiology resolved, now off fluids   Loose BMs No blood or fever or  leukocytosis. Is on abx but also on stool softeners hold sennakot, linzess, prn miralax further w/u if persists particularly if any alarm signs/symptoms   Debility Ambulates w/ a cane at baseline, lives alone PT advising snf, toc consulted, bed search undreway   Chronic back pain home baclofen, gabapentin   Tremor home propranolol   IBS home linzess on hold as above   HTN Bp improved today, wnl hold home lisinopril/hydrochlorothiazide   Hx CVA cont home aspirin (but will reduce dose from 325 to 81)   GAD cont home buspar, trazodone, effexor   T2DM Glucose controlled hold home metformin SSI cont semglee 5       overweight based on BMI: Body mass index is 27.43 kg/m.  Underweight - under 18.5  normal weight - 18.5 to 24.9 overweight - 25 to 29.9 obese - 30 or more   DVT prophylaxis: lovenox IV fluids: no continuous IV fluids  Nutrition: carb/cardiac  Central lines / invasive devices: none  Code Status: DNR ACP documentation reviewed: 12/02/22 and none on file in [facility]  TOC needs: SNF placement Barriers to dispo / significant pending items: placement, O2 requirement              Subjective / Brief ROS:  Patient reports feeling of something stuck in throat / uncomfortable swallowing but no dysphagia  Denies CP/SOB. (+)Cough.  Pain controlled.  Denies new weakness.  Tolerating diet.  Reports no concerns w/ urination/defecation.   Family Communication: none at this time     Objective Findings:  Vitals:   12/02/22 0340 12/02/22 0803 12/02/22 0847 12/02/22 1412  BP: 116/66  132/60   Pulse: (!) 58  (!) 58   Resp: 18     Temp: 97.6 F (36.4 C)  97.6 F (36.4 C)   TempSrc: Oral  Oral   SpO2: 100% 100% 100% 100%  Weight:      Height:        Intake/Output Summary (Last 24 hours) at 12/02/2022 1429 Last data filed at 12/02/2022 0324 Gross per 24 hour  Intake 100 ml  Output 800 ml  Net -700 ml   Filed Weights   11/28/22 1636  11/29/22 1900  Weight: 61.2 kg 63.7 kg    Examination:  Physical Exam Constitutional:      Appearance: She is well-developed.  Cardiovascular:     Rate and Rhythm: Normal rate and regular rhythm.  Pulmonary:     Effort: Pulmonary effort is normal. No tachypnea, accessory muscle usage or respiratory distress.     Breath sounds: Wheezing present.  Musculoskeletal:     Cervical back: Normal range of motion and neck supple.     Right lower leg: No edema.     Left lower leg: No edema.  Lymphadenopathy:     Cervical: No cervical adenopathy.  Skin:    General: Skin is warm and dry.  Neurological:     General: No focal deficit present.     Mental Status: She is alert and oriented to person, place, and time.  Psychiatric:        Mood and Affect: Mood normal.        Behavior: Behavior normal.          Scheduled Medications:   aspirin EC  81 mg Oral Daily   baclofen  10 mg Oral TID   busPIRone  15 mg Oral BID   dextromethorphan-guaiFENesin  2 tablet Oral BID   enoxaparin (LOVENOX) injection  40 mg Subcutaneous Q24H   influenza vaccine adjuvanted  0.5 mL Intramuscular Tomorrow-1000   insulin aspart  0-5 Units Subcutaneous QHS   insulin aspart  0-9 Units Subcutaneous TID WC   insulin glargine-yfgn  5 Units Subcutaneous QHS   ipratropium-albuterol  3 mL Nebulization TID   nicotine  21 mg Transdermal Daily   pantoprazole  40 mg Oral Daily   predniSONE  40 mg Oral Q breakfast   propranolol  40 mg Oral BID   traZODone  300 mg Oral QHS   venlafaxine XR  150 mg Oral Q breakfast    Continuous Infusions:  cefTRIAXone (ROCEPHIN)  IV Stopped (12/01/22 1849)    PRN Medications:  acetaminophen, albuterol, gabapentin, ondansetron (ZOFRAN) IV, polyvinyl alcohol  Antimicrobials from admission:  Anti-infectives (From admission, onward)    Start     Dose/Rate Route Frequency Ordered Stop   11/29/22 2200  azithromycin (ZITHROMAX) tablet 500 mg  Status:  Discontinued        500 mg  Oral Daily at bedtime 11/29/22 0841 11/30/22 1500   11/28/22 1845  cefTRIAXone (ROCEPHIN) 2 g in sodium chloride 0.9 % 100 mL IVPB        2 g 200 mL/hr over 30 Minutes Intravenous Every 24 hours 11/28/22 1841 12/03/22 1844   11/28/22 1845  azithromycin (ZITHROMAX) 500 mg in dextrose 5 % 250 mL IVPB  Status:  Discontinued        500 mg 250 mL/hr over 60 Minutes Intravenous Every 24 hours 11/28/22 1841 11/29/22 0841           Data Reviewed:  I have personally reviewed the following...  CBC: Recent  Labs  Lab 11/28/22 1630 11/29/22 0222 12/01/22 0557 12/02/22 0322  WBC 8.4 4.3 5.2 5.7  NEUTROABS 5.4  --   --   --   HGB 11.9* 10.5* 9.6* 10.0*  HCT 38.9 34.1* 29.8* 31.7*  MCV 91.7 92.9 88.7 89.3  PLT 174 168 195 189   Basic Metabolic Panel: Recent Labs  Lab 11/28/22 1630 11/29/22 0222 12/01/22 0557 12/02/22 0322  NA 139 137 142 140  K 4.1 4.2 4.0 3.8  CL 94* 97* 105 104  CO2 34* 31 32 29  GLUCOSE 102* 156* 122* 84  BUN 22 21 23 23   CREATININE 1.01* 0.95 0.79 0.95  CALCIUM 8.7* 8.0* 9.0 8.6*  MG 1.7  --   --   --    GFR: Estimated Creatinine Clearance: 48.6 mL/min (by C-G formula based on SCr of 0.95 mg/dL). Liver Function Tests: Recent Labs  Lab 11/28/22 1630  AST 16  ALT 15  ALKPHOS 73  BILITOT 0.7  PROT 7.2  ALBUMIN 3.7   No results for input(s): "LIPASE", "AMYLASE" in the last 168 hours. No results for input(s): "AMMONIA" in the last 168 hours. Coagulation Profile: No results for input(s): "INR", "PROTIME" in the last 168 hours. Cardiac Enzymes: No results for input(s): "CKTOTAL", "CKMB", "CKMBINDEX", "TROPONINI" in the last 168 hours. BNP (last 3 results) No results for input(s): "PROBNP" in the last 8760 hours. HbA1C: No results for input(s): "HGBA1C" in the last 72 hours. CBG: Recent Labs  Lab 12/01/22 1223 12/01/22 1629 12/01/22 2056 12/02/22 0844 12/02/22 1209  GLUCAP 116* 92 107* 91 131*   Lipid Profile: No results for input(s):  "CHOL", "HDL", "LDLCALC", "TRIG", "CHOLHDL", "LDLDIRECT" in the last 72 hours. Thyroid Function Tests: No results for input(s): "TSH", "T4TOTAL", "FREET4", "T3FREE", "THYROIDAB" in the last 72 hours. Anemia Panel: No results for input(s): "VITAMINB12", "FOLATE", "FERRITIN", "TIBC", "IRON", "RETICCTPCT" in the last 72 hours. Most Recent Urinalysis On File:     Component Value Date/Time   COLORURINE YELLOW (A) 11/28/2022 1850   APPEARANCEUR CLEAR (A) 11/28/2022 1850   LABSPEC 1.039 (H) 11/28/2022 1850   PHURINE 5.0 11/28/2022 1850   GLUCOSEU NEGATIVE 11/28/2022 1850   HGBUR NEGATIVE 11/28/2022 1850   BILIRUBINUR NEGATIVE 11/28/2022 1850   KETONESUR 20 (A) 11/28/2022 1850   PROTEINUR NEGATIVE 11/28/2022 1850   NITRITE NEGATIVE 11/28/2022 1850   LEUKOCYTESUR NEGATIVE 11/28/2022 1850   Sepsis Labs: @LABRCNTIP (procalcitonin:4,lacticidven:4) Microbiology: Recent Results (from the past 240 hour(s))  Blood Culture (routine x 2)     Status: None (Preliminary result)   Collection Time: 11/28/22  4:32 PM   Specimen: BLOOD  Result Value Ref Range Status   Specimen Description BLOOD RIGHT [facility]  Final   Special Requests BOTTLES DRAWN AEROBIC AND ANAEROBIC  Final   Culture   Final    NO GROWTH 4 DAYS Performed at [facility], [address] [place], [place] [address]    Report Status PENDING  Incomplete  Blood Culture (routine x 2)     Status: None (Preliminary result)   Collection Time: 11/28/22  4:32 PM   Specimen: BLOOD  Result Value Ref Range Status   Specimen Description BLOOD LEFT AC  Final   Special Requests BOTTLES DRAWN AEROBIC AND ANAEROBIC  Final   Culture   Final    NO GROWTH 4 DAYS Performed at [facility], [address] [place], [place] [address]    Report Status PENDING  Incomplete  Resp panel by RT-PCR (RSV, Flu A&B, Covid)  Anterior Nasal Swab     Status: None   Collection Time: 11/28/22  7:55 PM   Specimen: Anterior Nasal Swab  Result Value Ref  Range Status   SARS Coronavirus 2 by RT PCR NEGATIVE NEGATIVE Final    Comment: (NOTE) SARS-CoV-2 target nucleic acids are NOT DETECTED.  The SARS-CoV-2 RNA is generally detectable in upper respiratory specimens during the acute phase of infection. The lowest concentration of SARS-CoV-2 viral copies this assay can detect is 138 copies/mL. A negative result does not preclude SARS-Cov-2 infection and should not be used as the sole basis for treatment or other patient management decisions. A negative result may occur with  improper specimen collection/handling, submission of specimen other than nasopharyngeal swab, presence of viral mutation(s) within the areas targeted by this assay, and inadequate number of viral copies(<138 copies/mL). A negative result must be combined with clinical observations, patient history, and epidemiological information. The expected result is Negative.  Fact Sheet for Patients:  BloggerCourse.com  Fact Sheet for Healthcare Providers:  SeriousBroker.it  This test is no t yet approved or cleared by the [place] FDA and  has been authorized for detection and/or diagnosis of SARS-CoV-2 by FDA under an Emergency Use Authorization (EUA). This EUA will remain  in effect (meaning this test can be used) for the duration of the COVID-19 declaration under Section 564(b)(1) of the Act, 21 U.S.C.section 360bbb-3(b)(1), unless the authorization is terminated  or revoked sooner.       Influenza A by PCR NEGATIVE NEGATIVE Final   Influenza B by PCR NEGATIVE NEGATIVE Final    Comment: (NOTE) The Xpert Xpress SARS-CoV-2/FLU/RSV plus assay is intended as an aid in the diagnosis of influenza from Nasopharyngeal swab specimens and should not be used as a sole basis for treatment. Nasal washings and aspirates are unacceptable for Xpert Xpress SARS-CoV-2/FLU/RSV testing.  Fact Sheet for  Patients: BloggerCourse.com  Fact Sheet for Healthcare Providers: SeriousBroker.it  This test is not yet approved or cleared by the [place] FDA and has been authorized for detection and/or diagnosis of SARS-CoV-2 by FDA under an Emergency Use Authorization (EUA). This EUA will remain in effect (meaning this test can be used) for the duration of the COVID-19 declaration under Section 564(b)(1) of the Act, 21 U.S.C. section 360bbb-3(b)(1), unless the authorization is terminated or revoked.     Resp Syncytial Virus by PCR NEGATIVE NEGATIVE Final    Comment: (NOTE) Fact Sheet for Patients: BloggerCourse.com  Fact Sheet for Healthcare Providers: SeriousBroker.it  This test is not yet approved or cleared by the [place] FDA and has been authorized for detection and/or diagnosis of SARS-CoV-2 by FDA under an Emergency Use Authorization (EUA). This EUA will remain in effect (meaning this test can be used) for the duration of the COVID-19 declaration under Section 564(b)(1) of the Act, 21 U.S.C. section 360bbb-3(b)(1), unless the authorization is terminated or revoked.  Performed at [facility], [address] [place], [place] [address]   Respiratory (~20 pathogens) panel by PCR     Status: Abnormal   Collection Time: 11/29/22  9:02 AM   Specimen: Nasopharyngeal Swab; Respiratory  Result Value Ref Range Status   Adenovirus NOT DETECTED NOT DETECTED Final   Coronavirus 229E NOT DETECTED NOT DETECTED Final    Comment: (NOTE) The Coronavirus on the Respiratory Panel, DOES NOT test for the novel  Coronavirus (2019 nCoV)    Coronavirus HKU1 NOT DETECTED NOT DETECTED Final   Coronavirus NL63 NOT DETECTED NOT DETECTED  Final   Coronavirus OC43 NOT DETECTED NOT DETECTED Final   Metapneumovirus NOT DETECTED NOT DETECTED Final   Rhinovirus / Enterovirus NOT DETECTED NOT  DETECTED Final   Influenza A NOT DETECTED NOT DETECTED Final   Influenza B NOT DETECTED NOT DETECTED Final   Parainfluenza Virus 1 DETECTED (A) NOT DETECTED Final   Parainfluenza Virus 2 NOT DETECTED NOT DETECTED Final   Parainfluenza Virus 3 NOT DETECTED NOT DETECTED Final   Parainfluenza Virus 4 NOT DETECTED NOT DETECTED Final   Respiratory Syncytial Virus NOT DETECTED NOT DETECTED Final   Bordetella pertussis NOT DETECTED NOT DETECTED Final   Bordetella Parapertussis NOT DETECTED NOT DETECTED Final   Chlamydophila pneumoniae NOT DETECTED NOT DETECTED Final   Mycoplasma pneumoniae NOT DETECTED NOT DETECTED Final    Comment: Performed at [facility], [address], [place], [place] [address]  Expectorated Sputum Assessment w Gram Stain, Rflx to Resp Cult     Status: None   Collection Time: 11/29/22 12:15 PM   Specimen: Sputum  Result Value Ref Range Status   Specimen Description SPUTUM  Final   Special Requests EXPSU  Final   Sputum evaluation   Final    THIS SPECIMEN IS ACCEPTABLE FOR SPUTUM CULTURE Performed at [facility], [address], [place], [place] [address]    Report Status 11/29/2022 FINAL  Final  Culture, Respiratory w Gram Stain     Status: None   Collection Time: 11/29/22 12:15 PM   Specimen: SPU  Result Value Ref Range Status   Specimen Description   Final    SPUTUM Performed at [facility], [address], [place], [place] [address]    Special Requests   Final    EXPSU Reflexed from [id number] Performed at [facility], [address] [place], [place] [address]    Gram Stain   Final    ABUNDANT WBC PRESENT, PREDOMINANTLY PMN RARE GRAM POSITIVE COCCI IN PAIRS RARE GRAM POSITIVE COCCI IN CHAINS    Culture   Final    Normal respiratory flora-no Staph aureus or Pseudomonas seen Performed at [facility], [address], [place], [place] [address]    Report Status 12/01/2022 FINAL  Final      Radiology Studies  last 3 days: CT Angio Chest PE W and/or Wo Contrast  Result Date: 11/28/2022 CLINICAL DATA:  Difficulty breathing for 10 days, wheezing, hypoxia, tachycardia EXAM: CT ANGIOGRAPHY CHEST WITH CONTRAST TECHNIQUE: Multidetector CT imaging of the chest was performed using the standard protocol during bolus administration of intravenous contrast. Multiplanar CT image reconstructions and MIPs were obtained to evaluate the vascular anatomy. RADIATION DOSE REDUCTION: This exam was performed according to the departmental dose-optimization program which includes automated exposure control, adjustment of the mA and/or kV according to patient size and/or use of iterative reconstruction technique. CONTRAST:  75mL OMNIPAQUE IOHEXOL 350 MG/ML SOLN COMPARISON:  11/28/2022, 04/05/2016 FINDINGS: Cardiovascular: This is a technically adequate evaluation of the pulmonary vasculature. No filling defects or pulmonary emboli. The heart is unremarkable without pericardial effusion. No evidence of thoracic aortic aneurysm or dissection. Mediastinum/Nodes: No enlarged mediastinal, hilar, or axillary lymph nodes. Thyroid gland, trachea, and esophagus demonstrate no significant findings. Lungs/Pleura: Upper lobe predominant emphysema. There is bilateral bronchial wall thickening, with scattered tree in bud ground-glass airspace disease bilaterally, most pronounced in the left upper and right lower lobes. No other dense consolidation, effusion, or pneumothorax. The central airways are patent. Upper Abdomen: No acute abnormality. Musculoskeletal:  No acute or destructive bony abnormalities. Reconstructed images demonstrate no additional findings. Review of the MIP images confirms the above findings. IMPRESSION: 1. No evidence of pulmonary embolus. 2. Bilateral bronchial wall thickening with scattered tree in bud ground-glass airspace opacities, consistent with bronchitis and mild bilateral bronchopneumonia. 3.  Emphysema (ICD10-J43.9).  Electronically Signed   By: Sharlet Salina M.D.   On: 11/28/2022 18:22   DG Chest Port 1 View  Result Date: 11/28/2022 CLINICAL DATA:  Questionable sepsis. Evaluate for abnormality. Shortness of breath. EXAM: PORTABLE CHEST 1 VIEW COMPARISON:  Two-view chest x-ray 11/14/2015.  CT chest 04/05/2016. FINDINGS: The heart size and mediastinal contours are within normal limits. Both lungs are clear. The visualized skeletal structures are unremarkable. IMPRESSION: Negative one-view chest x-ray Electronically Signed   By: Marin Roberts M.D.   On: 11/28/2022 16:56          Sunnie Nielsen, DO [facility] 12/02/2022, 2:29 PM    Dictation software may have been used to generate the above note. Typos may occur and escape review in typed/dictated notes. Please contact Dr Lyn Hollingshead directly for clarity if needed.  Staff may message me via secure chat in Epic  but this may not receive an immediate response,  please page me for urgent matters!  If 7PM-7AM, please contact night coverage [email or web address]

## 2022-12-03 DIAGNOSIS — J441 Chronic obstructive pulmonary disease with (acute) exacerbation: Secondary | ICD-10-CM | POA: Diagnosis not present

## 2022-12-03 LAB — CBC
HCT: 31.1 % — ABNORMAL LOW (ref 36.0–46.0)
Hemoglobin: 10.2 g/dL — ABNORMAL LOW (ref 12.0–15.0)
MCH: 28.5 pg (ref 26.0–34.0)
MCHC: 32.8 g/dL (ref 30.0–36.0)
MCV: 86.9 fL (ref 80.0–100.0)
Platelets: 221 10*3/uL (ref 150–400)
RBC: 3.58 MIL/uL — ABNORMAL LOW (ref 3.87–5.11)
RDW: 13.8 % (ref 11.5–15.5)
WBC: 4.6 10*3/uL (ref 4.0–10.5)
nRBC: 0 % (ref 0.0–0.2)

## 2022-12-03 LAB — CULTURE, BLOOD (ROUTINE X 2)
Culture: NO GROWTH
Culture: NO GROWTH

## 2022-12-03 LAB — BASIC METABOLIC PANEL
Anion gap: 8 (ref 5–15)
BUN: 17 mg/dL (ref 8–23)
CO2: 31 mmol/L (ref 22–32)
Calcium: 8.6 mg/dL — ABNORMAL LOW (ref 8.9–10.3)
Chloride: 102 mmol/L (ref 98–111)
Creatinine, Ser: 0.87 mg/dL (ref 0.44–1.00)
GFR, Estimated: >60 mL/min (ref >60)
Glucose, Bld: 83 mg/dL (ref 70–99)
Potassium: 3.4 mmol/L — ABNORMAL LOW (ref 3.5–5.1)
Sodium: 141 mmol/L (ref 135–145)

## 2022-12-03 LAB — GLUCOSE, CAPILLARY
Glucose-Capillary: 96 mg/dL (ref 70–99)
Glucose-Capillary: 107 mg/dL — ABNORMAL HIGH (ref 70–99)
Glucose-Capillary: 155 mg/dL — ABNORMAL HIGH (ref 70–99)
Glucose-Capillary: 238 mg/dL — ABNORMAL HIGH (ref 70–99)

## 2022-12-03 NOTE — Progress Notes (Signed)
Consult placed for IV team difficult start on previous shift. Spoke with unit RN who was told in report that pt has 2 PIV in place. At this time pt has no IV infusions remaining to be administered. Advised unit RN to place consult to IV team at such time that she has further evaluated pt or orders require PIV access.

## 2022-12-03 NOTE — Plan of Care (Signed)
  Problem: Education: Goal: Ability to describe self-care measures that may prevent or decrease complications (Diabetes Survival Skills Education) will improve Outcome: Progressing Goal: Individualized Educational Video(s) Outcome: Progressing   Problem: Coping: Goal: Ability to adjust to condition or change in health will improve Outcome: Progressing   Problem: Fluid Volume: Goal: Ability to maintain a balanced intake and output will improve Outcome: Progressing   Problem: Health Behavior/Discharge Planning: Goal: Ability to identify and utilize available resources and services will improve Outcome: Progressing Goal: Ability to manage health-related needs will improve Outcome: Progressing   Problem: Metabolic: Goal: Ability to maintain appropriate glucose levels will improve Outcome: Progressing   Problem: Nutritional: Goal: Maintenance of adequate nutrition will improve Outcome: Progressing Goal: Progress toward achieving an optimal weight will improve Outcome: Progressing   Problem: Skin Integrity: Goal: Risk for impaired skin integrity will decrease Outcome: Progressing   Problem: Tissue Perfusion: Goal: Adequacy of tissue perfusion will improve Outcome: Progressing   Problem: Education: Goal: Knowledge of General Education information will improve Description: Including pain rating scale, medication(s)/side effects and non-pharmacologic comfort measures Outcome: Progressing   Problem: Health Behavior/Discharge Planning: Goal: Ability to manage health-related needs will improve Outcome: Progressing   Problem: Clinical Measurements: Goal: Ability to maintain clinical measurements within normal limits will improve Outcome: Progressing Goal: Will remain free from infection Outcome: Progressing Goal: Diagnostic test results will improve Outcome: Progressing Goal: Respiratory complications will improve Outcome: Progressing Goal: Cardiovascular complication will  be avoided Outcome: Progressing   Problem: Activity: Goal: Risk for activity intolerance will decrease Outcome: Progressing   Problem: Nutrition: Goal: Adequate nutrition will be maintained Outcome: Progressing   Problem: Coping: Goal: Level of anxiety will decrease Outcome: Progressing   Problem: Elimination: Goal: Will not experience complications related to bowel motility Outcome: Progressing Goal: Will not experience complications related to urinary retention Outcome: Progressing   Problem: Pain Management: Goal: General experience of comfort will improve Outcome: Progressing   Problem: Safety: Goal: Ability to remain free from injury will improve Outcome: Progressing   Problem: Skin Integrity: Goal: Risk for impaired skin integrity will decrease Outcome: Progressing   Problem: Education: Goal: Knowledge of disease or condition will improve Outcome: Progressing Goal: Knowledge of the prescribed therapeutic regimen will improve Outcome: Progressing Goal: Individualized Educational Video(s) Outcome: Progressing   Problem: Activity: Goal: Ability to tolerate increased activity will improve Outcome: Progressing Goal: Will verbalize the importance of balancing activity with adequate rest periods Outcome: Progressing   Problem: Respiratory: Goal: Ability to maintain a clear airway will improve Outcome: Progressing Goal: Levels of oxygenation will improve Outcome: Progressing Goal: Ability to maintain adequate ventilation will improve Outcome: Progressing   Problem: Activity: Goal: Ability to tolerate increased activity will improve Outcome: Progressing   Problem: Clinical Measurements: Goal: Ability to maintain a body temperature in the normal range will improve Outcome: Progressing   Problem: Respiratory: Goal: Ability to maintain adequate ventilation will improve Outcome: Progressing Goal: Ability to maintain a clear airway will improve Outcome:  Progressing

## 2022-12-03 NOTE — Plan of Care (Signed)
  Problem: Education: Goal: Knowledge of General Education information will improve Description: Including pain rating scale, medication(s)/side effects and non-pharmacologic comfort measures Outcome: Progressing   Problem: Clinical Measurements: Goal: Respiratory complications will improve Outcome: Progressing   Problem: Clinical Measurements: Goal: Cardiovascular complication will be avoided Outcome: Progressing   Problem: Activity: Goal: Risk for activity intolerance will decrease Outcome: Progressing   Problem: Elimination: Goal: Will not experience complications related to bowel motility Outcome: Progressing   Problem: Elimination: Goal: Will not experience complications related to urinary retention Outcome: Progressing   Problem: Pain Management: Goal: General experience of comfort will improve Outcome: Progressing   Problem: Safety: Goal: Ability to remain free from injury will improve Outcome: Progressing

## 2022-12-03 NOTE — Progress Notes (Signed)
Physical Therapy Treatment Patient Details Name: Sheri Barron MRN: [id number] DOB: Oct 19, 1955 Today's Date: 12/03/2022   History of Present Illness Pt is 67 y/o admitted 11/28/22 for COPD exacerbation. Pt experiences symptoms of dyspnea, fatigue, and the inability to produce of productive cough.PmHx includes asthma, HTN, and anxiety.    PT Comments  Pt not seen since Tuesday, but she reports big improvements overall. O2 flow rate down to 2L now which is sufficient for AMB short distances in room, however dyspnea is flared up along with anxiety, takes 4-5 minutes to simmer down to a level to allow conversation. Pt is up to chair at end of session set up for pending meal. MD in room at end of session. Definite signs of improvement, but still very limited, far from her baseline. Will continue to follow.    If plan is discharge home, recommend the following: Assist for transportation;Help with stairs or ramp for entrance;A little help with bathing/dressing/bathroom;A lot of help with walking and/or transfers   Can travel by private vehicle     No  Equipment Recommendations  BSC/3in1;Rolling walker (2 wheels)    Recommendations for Other Services       Precautions / Restrictions Precautions Precautions: Fall Restrictions Weight Bearing Restrictions: No     Mobility  Bed Mobility Overal bed mobility: Modified Independent Bed Mobility: Supine to Sit     Supine to sit: Supervision          Transfers Overall transfer level: Needs assistance Equipment used: Rolling walker (2 wheels), 1 person hand held assist               General transfer comment: CGA to supervision    Ambulation/Gait   Gait Distance (Feet): 24 Feet Assistive device: None Gait Pattern/deviations: Step-to pattern       General Gait Details: side step AMB from EOB to CL EOB, then return; performed on 2L, maintains sats, 60sec to halfway point, 60sec rest, then 60sec return to CL EOB; dyspnea  increased x3-4 minutes thereafter despite sats>92% on 2L. MD in room. (2 hands on rail assist)   Stairs             Wheelchair Mobility     Tilt Bed    Modified Rankin (Stroke Patients Only)       Balance                                            Cognition                                                Exercises      General Comments        Pertinent Vitals/Pain Pain Assessment Pain Assessment: No/denies pain    Home Living                          Prior Function            PT Goals (current goals can now be found in the care plan section) Acute Rehab PT Goals Patient Stated Goal: To go home and get better PT Goal Formulation: With patient Time For Goal Achievement: 12/14/22 Potential to Achieve Goals: Good Progress towards PT goals: Progressing  toward goals    Frequency    Min 1X/week      PT Plan      Co-evaluation              AM-PAC PT "6 Clicks" Mobility   Outcome Measure  Help needed turning from your back to your side while in a flat bed without using bedrails?: A Little Help needed moving from lying on your back to sitting on the side of a flat bed without using bedrails?: A Little Help needed moving to and from a bed to a chair (including a wheelchair)?: A Little Help needed standing up from a chair using your arms (e.g., wheelchair or bedside chair)?: A Lot Help needed to walk in hospital room?: A Lot Help needed climbing 3-5 steps with a railing? : A Lot 6 Click Score: 15    End of Session   Activity Tolerance: Patient tolerated treatment well;No increased pain Patient left: in bed;with call bell/phone within reach;with bed alarm set Nurse Communication: Mobility status PT Visit Diagnosis: Unsteadiness on feet (R26.81);Other abnormalities of gait and mobility (R26.89);Muscle weakness (generalized) (M62.81);Difficulty in walking, not elsewhere classified (R26.2)      Time: 1610-9604 PT Time Calculation (min) (ACUTE ONLY): 36 min  Charges:    $Therapeutic Activity: 23-37 mins PT General Charges $$ ACUTE PT VISIT: 1 Visit                    2:47 PM, 12/03/22 Rosamaria Lints, PT, DPT Physical Therapist - [facility]  [phone] (ASCOM)    Berdell Nevitt C 12/03/2022, 2:37 PM

## 2022-12-03 NOTE — Progress Notes (Signed)
PROGRESS NOTE    Sheri Barron   QMV:[id number] DOB: 04-18-1955  DOA: 11/28/2022 Date of Service: 12/03/22 which is hospital day 5  PCP: Abram Sander, MD    HPI: [name] is a 67 y.o. female with medical history significant of asthma, COPD on 2 L oxygen at night, HTN, HLD, stroke, depression with anxiety, chronic constipation, tobacco abuse, who presents with shortness of breath more than 10 days, which has been progressively worsening. Patient has dry cough and wheezing.   Hospital course / significant events:  11/17: admitted to hospitalist service for COPD exacerbation / sepsis CAP 11/18: maintaining on IV fluids pending cultures  11/19-11/21: BiPap at bedtime, saturating well on 4L [place]. Pending SNF placement  11/22: [facility] can take her this weekend, working on tapering down O2     Consultants:  none  Procedures/Surgeries: none      ASSESSMENT & PLAN:   COPD with acute exacerbation Parainfluenza virus 1 infection Requiring intermittent bipap at night but overall reports improvement.  Received 3 days steroids as outpatient, no recent abx.  Blood cultures ngtd, sputum culture no abnormal growth. steroids (transition to orals today) abx (ceftriaxone) breathing treatments Taper O2 to baseline as able, still higher O2 requirement on ambulation but improving, anticipate d/c tomorrow   Globus sensation Scheduled expectorants and breathing tx for today    Acute hypoxic hypercarbic respiratory failure Chronic hypoxic respiratory failure on 2L O2 [place] at home  bipap prn titrate [place] o2 to 88-92   CAP CTA no PE but shows signs bronchitis and b/l bronchopneumonia. Procal is, however, low. Covid/flu/rsv neg. No LE swelling or pulm edema to suggest chf exacerbation, no chest pain to suggest acs. Cultures neg. continue abx, at a minimum the will have anti-inflammatory properties that will help COPD   Sepsis, severe d/t parainfluenza virus vs CAP Possible SIRS d/t COPD  exacerbation  By elevated lactate, tachycardia and tachypnea. Sepisis physiology resolved, now off fluids   Loose BMs No blood or fever or leukocytosis. Is on abx but also on stool softeners hold sennakot, linzess, prn miralax further w/u if persists particularly if any alarm signs/symptoms   Debility Ambulates w/ a cane at baseline, lives alone PT advising snf, toc consulted, bed search undreway   Chronic back pain home baclofen, gabapentin   Tremor home propranolol   IBS home linzess on hold as above   HTN Bp improved today, wnl hold home lisinopril/hydrochlorothiazide   Hx CVA cont home aspirin (but will reduce dose from 325 to 81)   GAD cont home buspar, trazodone, effexor   T2DM Glucose controlled hold home metformin SSI cont semglee 5       overweight based on BMI: Body mass index is 27.43 kg/m.  Underweight - under 18.5  normal weight - 18.5 to 24.9 overweight - 25 to 29.9 obese - 30 or more   DVT prophylaxis: lovenox IV fluids: no continuous IV fluids  Nutrition: carb/cardiac  Central lines / invasive devices: none  Code Status: DNR ACP documentation reviewed: 12/02/22 and none on file in [facility]  TOC needs: SNF placement -[facility] can take over the weekend  Barriers to dispo / significant pending items:  still higher O2 requirement on ambulation but improving, anticipate d/c tomorrow              Subjective / Brief ROS:  Patient reports feeling of something stuck in throat / uncomfortable swallowing but no dysphagia  Denies CP/SOB. (+)Cough.  Pain controlled.  Denies  new weakness.  Tolerating diet.  Reports no concerns w/ urination/defecation.   Family Communication: none at this time     Objective Findings:  Vitals:   12/03/22 0432 12/03/22 0739 12/03/22 0931 12/03/22 1310  BP: (!) 145/76  123/61 (!) 149/76  Pulse: 65  72 70  Resp: 20     Temp: 98.4 F (36.9 C)  98.4 F (36.9 C) (!) 97.5 F (36.4 C)  TempSrc:   Oral    SpO2: 97% 98% 98% 96%  Weight:      Height:        Intake/Output Summary (Last 24 hours) at 12/03/2022 1323 Last data filed at 12/03/2022 1040 Gross per 24 hour  Intake 480 ml  Output 1050 ml  Net -570 ml   Filed Weights   11/28/22 1636 11/29/22 1900  Weight: 61.2 kg 63.7 kg    Examination:  Physical Exam Constitutional:      Appearance: She is well-developed.  Cardiovascular:     Rate and Rhythm: Normal rate and regular rhythm.  Pulmonary:     Effort: Pulmonary effort is normal. No tachypnea, accessory muscle usage or respiratory distress.     Breath sounds: Wheezing (diffuse, but improved from yesterday) present.  Musculoskeletal:     Cervical back: Normal range of motion and neck supple.     Right lower leg: No edema.     Left lower leg: No edema.  Lymphadenopathy:     Cervical: No cervical adenopathy.  Skin:    General: Skin is warm and dry.  Neurological:     General: No focal deficit present.     Mental Status: She is alert and oriented to person, place, and time.  Psychiatric:        Mood and Affect: Mood normal.        Behavior: Behavior normal.          Scheduled Medications:   aspirin EC  81 mg Oral Daily   baclofen  10 mg Oral TID   busPIRone  15 mg Oral BID   dextromethorphan-guaiFENesin  2 tablet Oral BID   enoxaparin (LOVENOX) injection  40 mg Subcutaneous Q24H   influenza vaccine adjuvanted  0.5 mL Intramuscular Tomorrow-1000   insulin aspart  0-5 Units Subcutaneous QHS   insulin aspart  0-9 Units Subcutaneous TID WC   insulin glargine-yfgn  5 Units Subcutaneous QHS   ipratropium-albuterol  3 mL Nebulization TID   nicotine  21 mg Transdermal Daily   pantoprazole  40 mg Oral Daily   predniSONE  40 mg Oral Q breakfast   propranolol  40 mg Oral BID   traZODone  300 mg Oral QHS   venlafaxine XR  150 mg Oral Q breakfast    Continuous Infusions:    PRN Medications:  acetaminophen, albuterol, gabapentin, ondansetron (ZOFRAN) IV,  polyvinyl alcohol  Antimicrobials from admission:  Anti-infectives (From admission, onward)    Start     Dose/Rate Route Frequency Ordered Stop   11/29/22 2200  azithromycin (ZITHROMAX) tablet 500 mg  Status:  Discontinued        500 mg Oral Daily at bedtime 11/29/22 0841 11/30/22 1500   11/28/22 1845  cefTRIAXone (ROCEPHIN) 2 g in sodium chloride 0.9 % 100 mL IVPB        2 g 200 mL/hr over 30 Minutes Intravenous Every 24 hours 11/28/22 1841 12/02/22 1754   11/28/22 1845  azithromycin (ZITHROMAX) 500 mg in dextrose 5 % 250 mL IVPB  Status:  Discontinued  500 mg 250 mL/hr over 60 Minutes Intravenous Every 24 hours 11/28/22 1841 11/29/22 0841           Data Reviewed:  I have personally reviewed the following...  CBC: Recent Labs  Lab 11/28/22 1630 11/29/22 0222 12/01/22 0557 12/02/22 0322 12/03/22 0541  WBC 8.4 4.3 5.2 5.7 4.6  NEUTROABS 5.4  --   --   --   --   HGB 11.9* 10.5* 9.6* 10.0* 10.2*  HCT 38.9 34.1* 29.8* 31.7* 31.1*  MCV 91.7 92.9 88.7 89.3 86.9  PLT 174 168 195 189 221   Basic Metabolic Panel: Recent Labs  Lab 11/28/22 1630 11/29/22 0222 12/01/22 0557 12/02/22 0322 12/03/22 0541  NA 139 137 142 140 141  K 4.1 4.2 4.0 3.8 3.4*  CL 94* 97* 105 104 102  CO2 34* 31 32 29 31  GLUCOSE 102* 156* 122* 84 83  BUN 22 21 23 23 17   CREATININE 1.01* 0.95 0.79 0.95 0.87  CALCIUM 8.7* 8.0* 9.0 8.6* 8.6*  MG 1.7  --   --   --   --    GFR: Estimated Creatinine Clearance: 53 mL/min (by C-G formula based on SCr of 0.87 mg/dL). Liver Function Tests: Recent Labs  Lab 11/28/22 1630  AST 16  ALT 15  ALKPHOS 73  BILITOT 0.7  PROT 7.2  ALBUMIN 3.7   No results for input(s): "LIPASE", "AMYLASE" in the last 168 hours. No results for input(s): "AMMONIA" in the last 168 hours. Coagulation Profile: No results for input(s): "INR", "PROTIME" in the last 168 hours. Cardiac Enzymes: No results for input(s): "CKTOTAL", "CKMB", "CKMBINDEX", "TROPONINI" in the  last 168 hours. BNP (last 3 results) No results for input(s): "PROBNP" in the last 8760 hours. HbA1C: No results for input(s): "HGBA1C" in the last 72 hours. CBG: Recent Labs  Lab 12/02/22 1209 12/02/22 1720 12/02/22 2220 12/03/22 0926 12/03/22 1222  GLUCAP 131* 152* 116* 96 107*   Lipid Profile: No results for input(s): "CHOL", "HDL", "LDLCALC", "TRIG", "CHOLHDL", "LDLDIRECT" in the last 72 hours. Thyroid Function Tests: No results for input(s): "TSH", "T4TOTAL", "FREET4", "T3FREE", "THYROIDAB" in the last 72 hours. Anemia Panel: No results for input(s): "VITAMINB12", "FOLATE", "FERRITIN", "TIBC", "IRON", "RETICCTPCT" in the last 72 hours. Most Recent Urinalysis On File:     Component Value Date/Time   COLORURINE YELLOW (A) 11/28/2022 1850   APPEARANCEUR CLEAR (A) 11/28/2022 1850   LABSPEC 1.039 (H) 11/28/2022 1850   PHURINE 5.0 11/28/2022 1850   GLUCOSEU NEGATIVE 11/28/2022 1850   HGBUR NEGATIVE 11/28/2022 1850   BILIRUBINUR NEGATIVE 11/28/2022 1850   KETONESUR 20 (A) 11/28/2022 1850   PROTEINUR NEGATIVE 11/28/2022 1850   NITRITE NEGATIVE 11/28/2022 1850   LEUKOCYTESUR NEGATIVE 11/28/2022 1850   Sepsis Labs: @LABRCNTIP (procalcitonin:4,lacticidven:4) Microbiology: Recent Results (from the past 240 hour(s))  Blood Culture (routine x 2)     Status: None   Collection Time: 11/28/22  4:32 PM   Specimen: BLOOD  Result Value Ref Range Status   Specimen Description BLOOD RIGHT [facility]  Final   Special Requests BOTTLES DRAWN AEROBIC AND ANAEROBIC  Final   Culture   Final    NO GROWTH 5 DAYS Performed at [facility], [address], [place], [place] [address]    Report Status 12/03/2022 FINAL  Final  Blood Culture (routine x 2)     Status: None   Collection Time: 11/28/22  4:32 PM   Specimen: BLOOD  Result Value Ref Range Status   Specimen Description BLOOD  LEFT AC  Final   Special Requests BOTTLES DRAWN AEROBIC AND ANAEROBIC  Final   Culture   Final    NO  GROWTH 5 DAYS Performed at [facility], [address] [place], [place] [address]    Report Status 12/03/2022 FINAL  Final  Resp panel by RT-PCR (RSV, Flu A&B, Covid) Anterior Nasal Swab     Status: None   Collection Time: 11/28/22  7:55 PM   Specimen: Anterior Nasal Swab  Result Value Ref Range Status   SARS Coronavirus 2 by RT PCR NEGATIVE NEGATIVE Final    Comment: (NOTE) SARS-CoV-2 target nucleic acids are NOT DETECTED.  The SARS-CoV-2 RNA is generally detectable in upper respiratory specimens during the acute phase of infection. The lowest concentration of SARS-CoV-2 viral copies this assay can detect is 138 copies/mL. A negative result does not preclude SARS-Cov-2 infection and should not be used as the sole basis for treatment or other patient management decisions. A negative result may occur with  improper specimen collection/handling, submission of specimen other than nasopharyngeal swab, presence of viral mutation(s) within the areas targeted by this assay, and inadequate number of viral copies(<138 copies/mL). A negative result must be combined with clinical observations, patient history, and epidemiological information. The expected result is Negative.  Fact Sheet for Patients:  BloggerCourse.com  Fact Sheet for Healthcare Providers:  SeriousBroker.it  This test is no t yet approved or cleared by the [place] FDA and  has been authorized for detection and/or diagnosis of SARS-CoV-2 by FDA under an Emergency Use Authorization (EUA). This EUA will remain  in effect (meaning this test can be used) for the duration of the COVID-19 declaration under Section 564(b)(1) of the Act, 21 U.S.C.section 360bbb-3(b)(1), unless the authorization is terminated  or revoked sooner.       Influenza A by PCR NEGATIVE NEGATIVE Final   Influenza B by PCR NEGATIVE NEGATIVE Final    Comment: (NOTE) The Xpert Xpress  SARS-CoV-2/FLU/RSV plus assay is intended as an aid in the diagnosis of influenza from Nasopharyngeal swab specimens and should not be used as a sole basis for treatment. Nasal washings and aspirates are unacceptable for Xpert Xpress SARS-CoV-2/FLU/RSV testing.  Fact Sheet for Patients: BloggerCourse.com  Fact Sheet for Healthcare Providers: SeriousBroker.it  This test is not yet approved or cleared by the [place] FDA and has been authorized for detection and/or diagnosis of SARS-CoV-2 by FDA under an Emergency Use Authorization (EUA). This EUA will remain in effect (meaning this test can be used) for the duration of the COVID-19 declaration under Section 564(b)(1) of the Act, 21 U.S.C. section 360bbb-3(b)(1), unless the authorization is terminated or revoked.     Resp Syncytial Virus by PCR NEGATIVE NEGATIVE Final    Comment: (NOTE) Fact Sheet for Patients: BloggerCourse.com  Fact Sheet for Healthcare Providers: SeriousBroker.it  This test is not yet approved or cleared by the [place] FDA and has been authorized for detection and/or diagnosis of SARS-CoV-2 by FDA under an Emergency Use Authorization (EUA). This EUA will remain in effect (meaning this test can be used) for the duration of the COVID-19 declaration under Section 564(b)(1) of the Act, 21 U.S.C. section 360bbb-3(b)(1), unless the authorization is terminated or revoked.  Performed at [facility], [address] [place], [place] [address]   Respiratory (~20 pathogens) panel by PCR     Status: Abnormal   Collection Time: 11/29/22  9:02 AM   Specimen: Nasopharyngeal Swab; Respiratory  Result Value Ref  Range Status   Adenovirus NOT DETECTED NOT DETECTED Final   Coronavirus 229E NOT DETECTED NOT DETECTED Final    Comment: (NOTE) The Coronavirus on the Respiratory Panel, DOES NOT test for the  novel  Coronavirus (2019 nCoV)    Coronavirus HKU1 NOT DETECTED NOT DETECTED Final   Coronavirus NL63 NOT DETECTED NOT DETECTED Final   Coronavirus OC43 NOT DETECTED NOT DETECTED Final   Metapneumovirus NOT DETECTED NOT DETECTED Final   Rhinovirus / Enterovirus NOT DETECTED NOT DETECTED Final   Influenza A NOT DETECTED NOT DETECTED Final   Influenza B NOT DETECTED NOT DETECTED Final   Parainfluenza Virus 1 DETECTED (A) NOT DETECTED Final   Parainfluenza Virus 2 NOT DETECTED NOT DETECTED Final   Parainfluenza Virus 3 NOT DETECTED NOT DETECTED Final   Parainfluenza Virus 4 NOT DETECTED NOT DETECTED Final   Respiratory Syncytial Virus NOT DETECTED NOT DETECTED Final   Bordetella pertussis NOT DETECTED NOT DETECTED Final   Bordetella Parapertussis NOT DETECTED NOT DETECTED Final   Chlamydophila pneumoniae NOT DETECTED NOT DETECTED Final   Mycoplasma pneumoniae NOT DETECTED NOT DETECTED Final    Comment: Performed at [facility], [address], [place], [place] [address]  Expectorated Sputum Assessment w Gram Stain, Rflx to Resp Cult     Status: None   Collection Time: 11/29/22 12:15 PM   Specimen: Sputum  Result Value Ref Range Status   Specimen Description SPUTUM  Final   Special Requests EXPSU  Final   Sputum evaluation   Final    THIS SPECIMEN IS ACCEPTABLE FOR SPUTUM CULTURE Performed at [facility], [address] [place], [place] [address]    Report Status 11/29/2022 FINAL  Final  Culture, Respiratory w Gram Stain     Status: None   Collection Time: 11/29/22 12:15 PM   Specimen: SPU  Result Value Ref Range Status   Specimen Description   Final    SPUTUM Performed at [facility], [address], [place], [place] [address]    Special Requests   Final    EXPSU Reflexed from [id number] Performed at [facility], [address] [place], [place] [address]    Gram Stain   Final    ABUNDANT WBC PRESENT, PREDOMINANTLY PMN RARE GRAM POSITIVE  COCCI IN PAIRS RARE GRAM POSITIVE COCCI IN CHAINS    Culture   Final    Normal respiratory flora-no Staph aureus or Pseudomonas seen Performed at [facility], [address], [place], [place] [address]    Report Status 12/01/2022 FINAL  Final      Radiology Studies last 3 days: No results found.        Sunnie Nielsen, DO [facility] 12/03/2022, 1:23 PM    Dictation software may have been used to generate the above note. Typos may occur and escape review in typed/dictated notes. Please contact Dr Lyn Hollingshead directly for clarity if needed.  Staff may message me via secure chat in Epic  but this may not receive an immediate response,  please page me for urgent matters!  If 7PM-7AM, please contact night coverage [email or web address]

## 2022-12-04 LAB — GLUCOSE, CAPILLARY
Glucose-Capillary: 88 mg/dL (ref 70–99)
Glucose-Capillary: 93 mg/dL (ref 70–99)
Glucose-Capillary: 157 mg/dL — ABNORMAL HIGH (ref 70–99)
Glucose-Capillary: 131 mg/dL — ABNORMAL HIGH (ref 70–99)
Glucose-Capillary: 208 mg/dL — ABNORMAL HIGH (ref 70–99)
Glucose-Capillary: 140 mg/dL — ABNORMAL HIGH (ref 70–99)

## 2022-12-04 LAB — BASIC METABOLIC PANEL
Anion gap: 10 (ref 5–15)
BUN: 19 mg/dL (ref 8–23)
CO2: 29 mmol/L (ref 22–32)
Calcium: 9 mg/dL (ref 8.9–10.3)
Chloride: 102 mmol/L (ref 98–111)
Creatinine, Ser: 0.94 mg/dL (ref 0.44–1.00)
GFR, Estimated: >60 mL/min (ref >60)
Glucose, Bld: 82 mg/dL (ref 70–99)
Potassium: 3.3 mmol/L — ABNORMAL LOW (ref 3.5–5.1)
Sodium: 141 mmol/L (ref 135–145)

## 2022-12-04 LAB — CBC
HCT: 35.7 % — ABNORMAL LOW (ref 36.0–46.0)
Hemoglobin: 11.8 g/dL — ABNORMAL LOW (ref 12.0–15.0)
MCH: 28.1 pg (ref 26.0–34.0)
MCHC: 33.1 g/dL (ref 30.0–36.0)
MCV: 85 fL (ref 80.0–100.0)
Platelets: 302 10*3/uL (ref 150–400)
RBC: 4.2 MIL/uL (ref 3.87–5.11)
RDW: 13.5 % (ref 11.5–15.5)
WBC: 6.9 10*3/uL (ref 4.0–10.5)
nRBC: 0 % (ref 0.0–0.2)

## 2022-12-04 MED ORDER — PREDNISONE 10 MG PO TABS: ORAL_TABLET | ORAL | Status: DC

## 2022-12-04 MED ORDER — ORAL CARE MOUTH RINSE: 15.0000 mL | OROMUCOSAL | Status: DC | PRN

## 2022-12-04 MED ORDER — NICOTINE 21 MG/24HR TD PT24: 21.0000 mg | MEDICATED_PATCH | Freq: Every day | TRANSDERMAL | Status: DC

## 2022-12-04 MED ORDER — ASPIRIN 81 MG PO TBEC: 81.0000 mg | DELAYED_RELEASE_TABLET | Freq: Every day | ORAL | Status: DC

## 2022-12-04 MED ORDER — DM-GUAIFENESIN ER 30-600 MG PO TB12: 2.0000 | ORAL_TABLET | Freq: Two times a day (BID) | ORAL | Status: DC | PRN

## 2022-12-04 MED ORDER — POTASSIUM CHLORIDE CRYS ER 20 MEQ PO TBCR
40.0000 meq | EXTENDED_RELEASE_TABLET | Freq: Once | ORAL | Status: AC
  Administered 2022-12-04: 40 meq via ORAL
  Filled 2022-12-04: qty 2

## 2022-12-04 NOTE — Discharge Summary (Signed)
Physician Discharge Summary   Patient: Sheri Barron MRN: [id number]  DOB: [birth year]   Admit:     Date of Admission: 11/28/2022 Admitted from: home   Discharge: Date of discharge: 12/04/22 Disposition: SNF Condition at discharge: good  CODE STATUS: DNR - form signed and on chart at time of discharge      Discharge Physician: Sheri Nielsen, DO [facility]     PCP: Sheri Sander, MD  Recommendations for Outpatient Follow-up:  Follow up with PCP Sheri Sander, MD in 1-2 weeks   Discharge Instructions     Diet - low sodium heart healthy   Complete by: As directed    Diet Carb Modified   Complete by: As directed    Increase activity slowly   Complete by: As directed          Discharge Diagnoses: Principal Problem:   COPD exacerbation (HCC) Active Problems:   Acute on chronic respiratory failure with hypoxia and hypercapnia (HCC)   CAP (community acquired pneumonia)   Severe sepsis (HCC)   HTN (hypertension)   Diabetes mellitus without complication (HCC)   Stroke (HCC)   Depression with anxiety   Tobacco abuse   Irritable bowel syndrome without diarrhea   Infection due to parainfluenza virus 1       Hospital Course: HPI: Sheri Barron is a 67 y.o. female with medical history significant of asthma, COPD on 2 L oxygen at night, HTN, HLD, stroke, depression with anxiety, chronic constipation, tobacco abuse, who presents with shortness of breath more than 10 days, which has been progressively worsening. Patient has dry cough and wheezing.   Hospital course / significant events:  11/17: admitted to hospitalist service for COPD exacerbation / sepsis CAP 11/18: maintaining on IV fluids pending cultures  11/19-11/21: BiPap at bedtime, saturating well on 4L [place]. Pending SNF placement  11/22: [facility] can take her this weekend, working on tapering down O2 11/23: still needing higher O2 on ambulation, significant SOB on ambulation, overal  improved and stable for discharge      Consultants:  none  Procedures/Surgeries: none      ASSESSMENT & PLAN:   COPD with acute exacerbation Parainfluenza virus 1 infection Requiring intermittent bipap at night but overall reports improvement.  Received 3 days steroids as outpatient, no recent abx.  Blood cultures ngtd, sputum culture no abnormal growth. steroids slow taper abx (ceftriaxone) completed  breathing treatments Taper O2 to baseline as able, still higher O2 requirement on ambulation but improving  Globus sensation Scheduled expectorants and breathing tx improved this,    Acute hypoxic hypercarbic respiratory failure Chronic hypoxic respiratory failure on 2L O2 [place] at home  titrate  o2 to 88-92   CAP CTA no PE but shows signs bronchitis and b/l bronchopneumonia. Procal is, however, low. Covid/flu/rsv neg. No LE swelling or pulm edema to suggest chf exacerbation, no chest pain to suggest acs. Cultures neg. Completed abx  Sepsis, severe d/t parainfluenza virus vs CAP Possible SIRS d/t COPD exacerbation  By elevated lactate, tachycardia and tachypnea. Sepisis physiology resolved, now off fluids Monitor    Loose BMs No blood or fever or leukocytosis. Is on abx but also on stool softeners hold sennakot, linzess, prn miralax further w/u if persists particularly if any alarm signs/symptoms   Debility Ambulates w/ a cane at baseline, lives alone SNF rehab    Chronic back pain Meds as below    Tremor home propranolol   IBS home linzess on  hold restart outpatient as needed    HTN Bp improved, wnl hold home lisinopril/hydrochlorothiazide Follow outpatient    Hx CVA cont aspirin reduce dose from 325 to 81   GAD cont home buspar, trazodone, effexor   T2DM Glucose controlled Resume home metformin                Discharge Instructions  Allergies as of 12/04/2022       Reactions   Sulfa Antibiotics Nausea Only   Patient states  she gets dehydrated with sulfa as well.        Medication List     STOP taking these medications    aspirin 325 MG tablet Replaced by: aspirin EC 81 MG tablet   azelastine 0.1 % nasal spray Commonly known as: ASTELIN   beclomethasone 80 MCG/ACT inhaler Commonly known as: QVAR   gabapentin 100 MG capsule Commonly known as: NEURONTIN   hydrochlorothiazide 12.5 MG capsule Commonly known as: MICROZIDE   linaclotide 145 MCG Caps capsule Commonly known as: LINZESS   lisinopril-hydrochlorothiazide 10-12.5 MG tablet Commonly known as: ZESTORETIC   senna 8.6 MG Tabs tablet Commonly known as: SENOKOT   sucralfate 1 g tablet Commonly known as: CARAFATE   ursodiol 300 MG capsule Commonly known as: ACTIGALL       TAKE these medications    albuterol (2.5 MG/3ML) 0.083% nebulizer solution Commonly known as: PROVENTIL Take 2.5 mg by nebulization every 6 (six) hours as needed.   albuterol 108 (90 Base) MCG/ACT inhaler Commonly known as: VENTOLIN HFA Inhale 2 puffs into the lungs every 6 (six) hours as needed for wheezing or shortness of breath.   aspirin EC 81 MG tablet Take 1 tablet (81 mg total) by mouth daily. Swallow whole. Start taking on: December 05, 2022 Replaces: aspirin 325 MG tablet   baclofen 10 MG tablet Commonly known as: LIORESAL Take 10 mg by mouth 3 (three) times daily.   busPIRone 15 MG tablet Commonly known as: BUSPAR Take 15 mg by mouth 2 (two) times daily.   dextromethorphan-guaiFENesin 30-600 MG 12hr tablet Commonly known as: MUCINEX DM Take 2 tablets by mouth 2 (two) times daily as needed for cough.   fluticasone 50 MCG/ACT nasal spray Commonly known as: FLONASE Place 1 spray into both nostrils 2 (two) times daily.   ibuprofen 200 MG tablet Commonly known as: ADVIL Take 800 mg by mouth 3 (three) times daily as needed for moderate pain.   lidocaine 5 % Commonly known as: LIDODERM Place 1 patch onto the skin daily as needed. For  pain   loratadine 10 MG tablet Commonly known as: CLARITIN Take 10 mg by mouth daily.   metFORMIN 500 MG tablet Commonly known as: GLUCOPHAGE Take 500 mg by mouth 2 (two) times daily with a meal.   nicotine 21 mg/24hr patch Commonly known as: NICODERM CQ - dosed in mg/24 hours Place 1 patch (21 mg total) onto the skin daily. Start taking on: December 05, 2022   omeprazole 20 MG capsule Commonly known as: PRILOSEC Take 20 mg by mouth daily.   polyethylene glycol 17 g packet Commonly known as: MIRALAX / GLYCOLAX Take 17 g by mouth at bedtime.   predniSONE 10 MG tablet Commonly known as: DELTASONE Take 2 tablets (20 mg total) by mouth daily with breakfast for 5 days, THEN 1.5 tablets (15 mg total) daily with breakfast for 5 days, THEN 1 tablet (10 mg total) daily with breakfast for 5 days, THEN 0.5 tablets (5 mg total) daily  with breakfast for 5 days. Start taking on: December 04, 2022 What changed: See the new instructions.   propranolol 20 MG tablet Commonly known as: INDERAL Take 40 mg by mouth 2 (two) times daily.   tiotropium 18 MCG inhalation capsule Commonly known as: SPIRIVA Place 18 mcg into inhaler and inhale daily.   traZODone 100 MG tablet Commonly known as: DESYREL Take 300 mg by mouth at bedtime.   venlafaxine XR 150 MG 24 hr capsule Commonly known as: EFFEXOR-XR Take 150 mg by mouth daily with breakfast.          Allergies  Allergen Reactions   Sulfa Antibiotics Nausea Only    Patient states she gets dehydrated with sulfa as well.     Subjective: pt feeling much better today, still SOB on exertion but cough has improved.    Discharge Exam: BP (!) 141/82 (BP Location: Right Arm)   Pulse 67   Temp 98.2 F (36.8 C)   Resp 16   Ht 5' (1.524 m)   Wt 63.7 kg   SpO2 96%   BMI 27.43 kg/m  General: Pt is alert, awake, not in acute distress Cardiovascular: RRR, S1/S2 +, no rubs, no gallops Respiratory: scattered wheezing but overall improved  compared to precious exams Abdominal: Soft, NT, ND, bowel sounds + Extremities: no edema, no cyanosis     The results of significant diagnostics from this hospitalization (including imaging, microbiology, ancillary and laboratory) are listed below for reference.     Microbiology: Recent Results (from the past 240 hour(s))  Blood Culture (routine x 2)     Status: None   Collection Time: 11/28/22  4:32 PM   Specimen: BLOOD  Result Value Ref Range Status   Specimen Description BLOOD RIGHT [facility]  Final   Special Requests BOTTLES DRAWN AEROBIC AND ANAEROBIC  Final   Culture   Final    NO GROWTH 5 DAYS Performed at [facility], [address] [place], [place] [address]    Report Status 12/03/2022 FINAL  Final  Blood Culture (routine x 2)     Status: None   Collection Time: 11/28/22  4:32 PM   Specimen: BLOOD  Result Value Ref Range Status   Specimen Description BLOOD LEFT AC  Final   Special Requests BOTTLES DRAWN AEROBIC AND ANAEROBIC  Final   Culture   Final    NO GROWTH 5 DAYS Performed at [facility], [address], [place], [place] [address]    Report Status 12/03/2022 FINAL  Final  Resp panel by RT-PCR (RSV, Flu A&B, Covid) Anterior Nasal Swab     Status: None   Collection Time: 11/28/22  7:55 PM   Specimen: Anterior Nasal Swab  Result Value Ref Range Status   SARS Coronavirus 2 by RT PCR NEGATIVE NEGATIVE Final    Comment: (NOTE) SARS-CoV-2 target nucleic acids are NOT DETECTED.  The SARS-CoV-2 RNA is generally detectable in upper respiratory specimens during the acute phase of infection. The lowest concentration of SARS-CoV-2 viral copies this assay can detect is 138 copies/mL. A negative result does not preclude SARS-Cov-2 infection and should not be used as the sole basis for treatment or other patient management decisions. A negative result may occur with  improper specimen collection/handling, submission of specimen other than nasopharyngeal  swab, presence of viral mutation(s) within the areas targeted by this assay, and inadequate number of viral copies(<138 copies/mL). A negative result must be combined with clinical observations, patient history, and epidemiological information. The  expected result is Negative.  Fact Sheet for Patients:  BloggerCourse.com  Fact Sheet for Healthcare Providers:  SeriousBroker.it  This test is no t yet approved or cleared by the [place] FDA and  has been authorized for detection and/or diagnosis of SARS-CoV-2 by FDA under an Emergency Use Authorization (EUA). This EUA will remain  in effect (meaning this test can be used) for the duration of the COVID-19 declaration under Section 564(b)(1) of the Act, 21 U.S.C.section 360bbb-3(b)(1), unless the authorization is terminated  or revoked sooner.       Influenza A by PCR NEGATIVE NEGATIVE Final   Influenza B by PCR NEGATIVE NEGATIVE Final    Comment: (NOTE) The Xpert Xpress SARS-CoV-2/FLU/RSV plus assay is intended as an aid in the diagnosis of influenza from Nasopharyngeal swab specimens and should not be used as a sole basis for treatment. Nasal washings and aspirates are unacceptable for Xpert Xpress SARS-CoV-2/FLU/RSV testing.  Fact Sheet for Patients: BloggerCourse.com  Fact Sheet for Healthcare Providers: SeriousBroker.it  This test is not yet approved or cleared by the [place] FDA and has been authorized for detection and/or diagnosis of SARS-CoV-2 by FDA under an Emergency Use Authorization (EUA). This EUA will remain in effect (meaning this test can be used) for the duration of the COVID-19 declaration under Section 564(b)(1) of the Act, 21 U.S.C. section 360bbb-3(b)(1), unless the authorization is terminated or revoked.     Resp Syncytial Virus by PCR NEGATIVE NEGATIVE Final    Comment: (NOTE) Fact Sheet for  Patients: BloggerCourse.com  Fact Sheet for Healthcare Providers: SeriousBroker.it  This test is not yet approved or cleared by the [place] FDA and has been authorized for detection and/or diagnosis of SARS-CoV-2 by FDA under an Emergency Use Authorization (EUA). This EUA will remain in effect (meaning this test can be used) for the duration of the COVID-19 declaration under Section 564(b)(1) of the Act, 21 U.S.C. section 360bbb-3(b)(1), unless the authorization is terminated or revoked.  Performed at [facility], [address] [place], [place] [address]   Respiratory (~20 pathogens) panel by PCR     Status: Abnormal   Collection Time: 11/29/22  9:02 AM   Specimen: Nasopharyngeal Swab; Respiratory  Result Value Ref Range Status   Adenovirus NOT DETECTED NOT DETECTED Final   Coronavirus 229E NOT DETECTED NOT DETECTED Final    Comment: (NOTE) The Coronavirus on the Respiratory Panel, DOES NOT test for the novel  Coronavirus (2019 nCoV)    Coronavirus HKU1 NOT DETECTED NOT DETECTED Final   Coronavirus NL63 NOT DETECTED NOT DETECTED Final   Coronavirus OC43 NOT DETECTED NOT DETECTED Final   Metapneumovirus NOT DETECTED NOT DETECTED Final   Rhinovirus / Enterovirus NOT DETECTED NOT DETECTED Final   Influenza A NOT DETECTED NOT DETECTED Final   Influenza B NOT DETECTED NOT DETECTED Final   Parainfluenza Virus 1 DETECTED (A) NOT DETECTED Final   Parainfluenza Virus 2 NOT DETECTED NOT DETECTED Final   Parainfluenza Virus 3 NOT DETECTED NOT DETECTED Final   Parainfluenza Virus 4 NOT DETECTED NOT DETECTED Final   Respiratory Syncytial Virus NOT DETECTED NOT DETECTED Final   Bordetella pertussis NOT DETECTED NOT DETECTED Final   Bordetella Parapertussis NOT DETECTED NOT DETECTED Final   Chlamydophila pneumoniae NOT DETECTED NOT DETECTED Final   Mycoplasma pneumoniae NOT DETECTED NOT DETECTED Final    Comment:  Performed at [facility], [address] [place], [place] [address]  Expectorated Sputum Assessment w Gram Stain, Rflx  to Resp Cult     Status: None   Collection Time: 11/29/22 12:15 PM   Specimen: Sputum  Result Value Ref Range Status   Specimen Description SPUTUM  Final   Special Requests EXPSU  Final   Sputum evaluation   Final    THIS SPECIMEN IS ACCEPTABLE FOR SPUTUM CULTURE Performed at [facility], [address], [place], [place] [address]    Report Status 11/29/2022 FINAL  Final  Culture, Respiratory w Gram Stain     Status: None   Collection Time: 11/29/22 12:15 PM   Specimen: SPU  Result Value Ref Range Status   Specimen Description   Final    SPUTUM Performed at [facility], [address] [place], [place] [address]    Special Requests   Final    EXPSU Reflexed from [phone] Performed at [facility], [address] [place], [place] [address]    Gram Stain   Final    ABUNDANT WBC PRESENT, PREDOMINANTLY PMN RARE GRAM POSITIVE COCCI IN PAIRS RARE GRAM POSITIVE COCCI IN CHAINS    Culture   Final    Normal respiratory flora-no Staph aureus or Pseudomonas seen Performed at [facility], [address] [place], [place] [address]    Report Status 12/01/2022 FINAL  Final     Labs: BNP (last 3 results) No results for input(s): "BNP" in the last 8760 hours. Basic Metabolic Panel: Recent Labs  Lab 11/28/22 1630 11/29/22 0222 12/01/22 0557 12/02/22 0322 12/03/22 0541 12/04/22 0514  NA 139 137 142 140 141 141  K 4.1 4.2 4.0 3.8 3.4* 3.3*  CL 94* 97* 105 104 102 102  CO2 34* 31 32 29 31 29   GLUCOSE 102* 156* 122* 84 83 82  BUN 22 21 23 23 17 19   CREATININE 1.01* 0.95 0.79 0.95 0.87 0.94  CALCIUM 8.7* 8.0* 9.0 8.6* 8.6* 9.0  MG 1.7  --   --   --   --   --    Liver Function Tests: Recent Labs  Lab 11/28/22 1630  AST 16  ALT 15  ALKPHOS 73  BILITOT 0.7  PROT 7.2  ALBUMIN 3.7   No results for input(s):  "LIPASE", "AMYLASE" in the last 168 hours. No results for input(s): "AMMONIA" in the last 168 hours. CBC: Recent Labs  Lab 11/28/22 1630 11/29/22 0222 12/01/22 0557 12/02/22 0322 12/03/22 0541 12/04/22 0514  WBC 8.4 4.3 5.2 5.7 4.6 6.9  NEUTROABS 5.4  --   --   --   --   --   HGB 11.9* 10.5* 9.6* 10.0* 10.2* 11.8*  HCT 38.9 34.1* 29.8* 31.7* 31.1* 35.7*  MCV 91.7 92.9 88.7 89.3 86.9 85.0  PLT 174 168 195 189 221 302   Cardiac Enzymes: No results for input(s): "CKTOTAL", "CKMB", "CKMBINDEX", "TROPONINI" in the last 168 hours. BNP: Invalid input(s): "POCBNP" CBG: Recent Labs  Lab 12/03/22 2101 12/04/22 0733 12/04/22 0759 12/04/22 1113 12/04/22 1144  GLUCAP 238* 88 93 157* 131*   D-Dimer No results for input(s): "DDIMER" in the last 72 hours. Hgb A1c No results for input(s): "HGBA1C" in the last 72 hours. Lipid Profile No results for input(s): "CHOL", "HDL", "LDLCALC", "TRIG", "CHOLHDL", "LDLDIRECT" in the last 72 hours. Thyroid function studies No results for input(s): "TSH", "T4TOTAL", "T3FREE", "THYROIDAB" in the last 72 hours.  Invalid input(s): "FREET3" Anemia work up No results for input(s): "VITAMINB12", "FOLATE", "FERRITIN", "TIBC", "IRON", "RETICCTPCT" in the last 72 hours. Urinalysis  Component Value Date/Time   COLORURINE YELLOW (A) 11/28/2022 1850   APPEARANCEUR CLEAR (A) 11/28/2022 1850   LABSPEC 1.039 (H) 11/28/2022 1850   PHURINE 5.0 11/28/2022 1850   GLUCOSEU NEGATIVE 11/28/2022 1850   HGBUR NEGATIVE 11/28/2022 1850   BILIRUBINUR NEGATIVE 11/28/2022 1850   KETONESUR 20 (A) 11/28/2022 1850   PROTEINUR NEGATIVE 11/28/2022 1850   NITRITE NEGATIVE 11/28/2022 1850   LEUKOCYTESUR NEGATIVE 11/28/2022 1850   Sepsis Labs Recent Labs  Lab 12/01/22 0557 12/02/22 0322 12/03/22 0541 12/04/22 0514  WBC 5.2 5.7 4.6 6.9   Microbiology Recent Results (from the past 240 hour(s))  Blood Culture (routine x 2)     Status: None   Collection Time:  11/28/22  4:32 PM   Specimen: BLOOD  Result Value Ref Range Status   Specimen Description BLOOD RIGHT [facility]  Final   Special Requests BOTTLES DRAWN AEROBIC AND ANAEROBIC  Final   Culture   Final    NO GROWTH 5 DAYS Performed at [facility], [address] [place], [place] [address]    Report Status 12/03/2022 FINAL  Final  Blood Culture (routine x 2)     Status: None   Collection Time: 11/28/22  4:32 PM   Specimen: BLOOD  Result Value Ref Range Status   Specimen Description BLOOD LEFT AC  Final   Special Requests BOTTLES DRAWN AEROBIC AND ANAEROBIC  Final   Culture   Final    NO GROWTH 5 DAYS Performed at [facility], [address], [place], [place] [address]    Report Status 12/03/2022 FINAL  Final  Resp panel by RT-PCR (RSV, Flu A&B, Covid) Anterior Nasal Swab     Status: None   Collection Time: 11/28/22  7:55 PM   Specimen: Anterior Nasal Swab  Result Value Ref Range Status   SARS Coronavirus 2 by RT PCR NEGATIVE NEGATIVE Final    Comment: (NOTE) SARS-CoV-2 target nucleic acids are NOT DETECTED.  The SARS-CoV-2 RNA is generally detectable in upper respiratory specimens during the acute phase of infection. The lowest concentration of SARS-CoV-2 viral copies this assay can detect is 138 copies/mL. A negative result does not preclude SARS-Cov-2 infection and should not be used as the sole basis for treatment or other patient management decisions. A negative result may occur with  improper specimen collection/handling, submission of specimen other than nasopharyngeal swab, presence of viral mutation(s) within the areas targeted by this assay, and inadequate number of viral copies(<138 copies/mL). A negative result must be combined with clinical observations, patient history, and epidemiological information. The expected result is Negative.  Fact Sheet for Patients:  BloggerCourse.com  Fact Sheet for Healthcare Providers:   SeriousBroker.it  This test is no t yet approved or cleared by the [place] FDA and  has been authorized for detection and/or diagnosis of SARS-CoV-2 by FDA under an Emergency Use Authorization (EUA). This EUA will remain  in effect (meaning this test can be used) for the duration of the COVID-19 declaration under Section 564(b)(1) of the Act, 21 U.S.C.section 360bbb-3(b)(1), unless the authorization is terminated  or revoked sooner.       Influenza A by PCR NEGATIVE NEGATIVE Final   Influenza B by PCR NEGATIVE NEGATIVE Final    Comment: (NOTE) The Xpert Xpress SARS-CoV-2/FLU/RSV plus assay is intended as an aid in the diagnosis of influenza from Nasopharyngeal swab specimens and should not be used as a sole basis for treatment. Nasal washings and aspirates are unacceptable for Xpert Xpress SARS-CoV-2/FLU/RSV  testing.  Fact Sheet for Patients: BloggerCourse.com  Fact Sheet for Healthcare Providers: SeriousBroker.it  This test is not yet approved or cleared by the [place] FDA and has been authorized for detection and/or diagnosis of SARS-CoV-2 by FDA under an Emergency Use Authorization (EUA). This EUA will remain in effect (meaning this test can be used) for the duration of the COVID-19 declaration under Section 564(b)(1) of the Act, 21 U.S.C. section 360bbb-3(b)(1), unless the authorization is terminated or revoked.     Resp Syncytial Virus by PCR NEGATIVE NEGATIVE Final    Comment: (NOTE) Fact Sheet for Patients: BloggerCourse.com  Fact Sheet for Healthcare Providers: SeriousBroker.it  This test is not yet approved or cleared by the [place] FDA and has been authorized for detection and/or diagnosis of SARS-CoV-2 by FDA under an Emergency Use Authorization (EUA). This EUA will remain in effect (meaning this test can be used) for  the duration of the COVID-19 declaration under Section 564(b)(1) of the Act, 21 U.S.C. section 360bbb-3(b)(1), unless the authorization is terminated or revoked.  Performed at [facility], [address] [place], [place] [address]   Respiratory (~20 pathogens) panel by PCR     Status: Abnormal   Collection Time: 11/29/22  9:02 AM   Specimen: Nasopharyngeal Swab; Respiratory  Result Value Ref Range Status   Adenovirus NOT DETECTED NOT DETECTED Final   Coronavirus 229E NOT DETECTED NOT DETECTED Final    Comment: (NOTE) The Coronavirus on the Respiratory Panel, DOES NOT test for the novel  Coronavirus (2019 nCoV)    Coronavirus HKU1 NOT DETECTED NOT DETECTED Final   Coronavirus NL63 NOT DETECTED NOT DETECTED Final   Coronavirus OC43 NOT DETECTED NOT DETECTED Final   Metapneumovirus NOT DETECTED NOT DETECTED Final   Rhinovirus / Enterovirus NOT DETECTED NOT DETECTED Final   Influenza A NOT DETECTED NOT DETECTED Final   Influenza B NOT DETECTED NOT DETECTED Final   Parainfluenza Virus 1 DETECTED (A) NOT DETECTED Final   Parainfluenza Virus 2 NOT DETECTED NOT DETECTED Final   Parainfluenza Virus 3 NOT DETECTED NOT DETECTED Final   Parainfluenza Virus 4 NOT DETECTED NOT DETECTED Final   Respiratory Syncytial Virus NOT DETECTED NOT DETECTED Final   Bordetella pertussis NOT DETECTED NOT DETECTED Final   Bordetella Parapertussis NOT DETECTED NOT DETECTED Final   Chlamydophila pneumoniae NOT DETECTED NOT DETECTED Final   Mycoplasma pneumoniae NOT DETECTED NOT DETECTED Final    Comment: Performed at [facility], [address] [place], [place] [address]  Expectorated Sputum Assessment w Gram Stain, Rflx to Resp Cult     Status: None   Collection Time: 11/29/22 12:15 PM   Specimen: Sputum  Result Value Ref Range Status   Specimen Description SPUTUM  Final   Special Requests EXPSU  Final   Sputum evaluation   Final    THIS SPECIMEN IS ACCEPTABLE FOR SPUTUM  CULTURE Performed at [facility], [address], [place], [place] [address]    Report Status 11/29/2022 FINAL  Final  Culture, Respiratory w Gram Stain     Status: None   Collection Time: 11/29/22 12:15 PM   Specimen: SPU  Result Value Ref Range Status   Specimen Description   Final    SPUTUM Performed at [facility], [address] [place], [place] [address]    Special Requests   Final    EXPSU Reflexed from [id number] Performed at [facility], [address] [place], [place] [address]  Gram Stain   Final    ABUNDANT WBC PRESENT, PREDOMINANTLY PMN RARE GRAM POSITIVE COCCI IN PAIRS RARE GRAM POSITIVE COCCI IN CHAINS    Culture   Final    Normal respiratory flora-no Staph aureus or Pseudomonas seen Performed at [facility], [address], [place], [place] [address]    Report Status 12/01/2022 FINAL  Final   Imaging CT Angio Chest PE W and/or Wo Contrast  Result Date: 11/28/2022 CLINICAL DATA:  Difficulty breathing for 10 days, wheezing, hypoxia, tachycardia EXAM: CT ANGIOGRAPHY CHEST WITH CONTRAST TECHNIQUE: Multidetector CT imaging of the chest was performed using the standard protocol during bolus administration of intravenous contrast. Multiplanar CT image reconstructions and MIPs were obtained to evaluate the vascular anatomy. RADIATION DOSE REDUCTION: This exam was performed according to the departmental dose-optimization program which includes automated exposure control, adjustment of the mA and/or kV according to patient size and/or use of iterative reconstruction technique. CONTRAST:  75mL OMNIPAQUE IOHEXOL 350 MG/ML SOLN COMPARISON:  11/28/2022, 04/05/2016 FINDINGS: Cardiovascular: This is a technically adequate evaluation of the pulmonary vasculature. No filling defects or pulmonary emboli. The heart is unremarkable without pericardial effusion. No evidence of thoracic aortic aneurysm or dissection. Mediastinum/Nodes: No enlarged  mediastinal, hilar, or axillary lymph nodes. Thyroid gland, trachea, and esophagus demonstrate no significant findings. Lungs/Pleura: Upper lobe predominant emphysema. There is bilateral bronchial wall thickening, with scattered tree in bud ground-glass airspace disease bilaterally, most pronounced in the left upper and right lower lobes. No other dense consolidation, effusion, or pneumothorax. The central airways are patent. Upper Abdomen: No acute abnormality. Musculoskeletal: No acute or destructive bony abnormalities. Reconstructed images demonstrate no additional findings. Review of the MIP images confirms the above findings. IMPRESSION: 1. No evidence of pulmonary embolus. 2. Bilateral bronchial wall thickening with scattered tree in bud ground-glass airspace opacities, consistent with bronchitis and mild bilateral bronchopneumonia. 3.  Emphysema (ICD10-J43.9). Electronically Signed   By: Sharlet Salina M.D.   On: 11/28/2022 18:22   DG Chest Port 1 View  Result Date: 11/28/2022 CLINICAL DATA:  Questionable sepsis. Evaluate for abnormality. Shortness of breath. EXAM: PORTABLE CHEST 1 VIEW COMPARISON:  Two-view chest x-ray 11/14/2015.  CT chest 04/05/2016. FINDINGS: The heart size and mediastinal contours are within normal limits. Both lungs are clear. The visualized skeletal structures are unremarkable. IMPRESSION: Negative one-view chest x-ray Electronically Signed   By: Marin Roberts M.D.   On: 11/28/2022 16:56      Time coordinating discharge: over 30 minutes  SIGNED:  Sunnie Nielsen DO [facility]

## 2022-12-04 NOTE — Plan of Care (Signed)
  Problem: Education: Goal: Ability to describe self-care measures that may prevent or decrease complications (Diabetes Survival Skills Education) will improve Outcome: Progressing Goal: Individualized Educational Video(s) Outcome: Progressing   Problem: Coping: Goal: Ability to adjust to condition or change in health will improve Outcome: Progressing   Problem: Fluid Volume: Goal: Ability to maintain a balanced intake and output will improve Outcome: Progressing   Problem: Health Behavior/Discharge Planning: Goal: Ability to identify and utilize available resources and services will improve Outcome: Progressing Goal: Ability to manage health-related needs will improve Outcome: Progressing   Problem: Metabolic: Goal: Ability to maintain appropriate glucose levels will improve Outcome: Progressing   Problem: Nutritional: Goal: Maintenance of adequate nutrition will improve Outcome: Progressing Goal: Progress toward achieving an optimal weight will improve Outcome: Progressing   Problem: Skin Integrity: Goal: Risk for impaired skin integrity will decrease Outcome: Progressing   Problem: Tissue Perfusion: Goal: Adequacy of tissue perfusion will improve Outcome: Progressing   Problem: Education: Goal: Knowledge of General Education information will improve Description: Including pain rating scale, medication(s)/side effects and non-pharmacologic comfort measures Outcome: Progressing   Problem: Health Behavior/Discharge Planning: Goal: Ability to manage health-related needs will improve Outcome: Progressing   Problem: Clinical Measurements: Goal: Ability to maintain clinical measurements within normal limits will improve Outcome: Progressing Goal: Will remain free from infection Outcome: Progressing Goal: Diagnostic test results will improve Outcome: Progressing Goal: Respiratory complications will improve Outcome: Progressing Goal: Cardiovascular complication will  be avoided Outcome: Progressing   Problem: Activity: Goal: Risk for activity intolerance will decrease Outcome: Progressing   Problem: Nutrition: Goal: Adequate nutrition will be maintained Outcome: Progressing   Problem: Coping: Goal: Level of anxiety will decrease Outcome: Progressing   Problem: Elimination: Goal: Will not experience complications related to bowel motility Outcome: Progressing Goal: Will not experience complications related to urinary retention Outcome: Progressing   Problem: Pain Management: Goal: General experience of comfort will improve Outcome: Progressing   Problem: Safety: Goal: Ability to remain free from injury will improve Outcome: Progressing   Problem: Skin Integrity: Goal: Risk for impaired skin integrity will decrease Outcome: Progressing   Problem: Education: Goal: Knowledge of disease or condition will improve Outcome: Progressing Goal: Knowledge of the prescribed therapeutic regimen will improve Outcome: Progressing Goal: Individualized Educational Video(s) Outcome: Progressing   Problem: Activity: Goal: Ability to tolerate increased activity will improve Outcome: Progressing Goal: Will verbalize the importance of balancing activity with adequate rest periods Outcome: Progressing   Problem: Respiratory: Goal: Ability to maintain a clear airway will improve Outcome: Progressing Goal: Levels of oxygenation will improve Outcome: Progressing Goal: Ability to maintain adequate ventilation will improve Outcome: Progressing   Problem: Activity: Goal: Ability to tolerate increased activity will improve Outcome: Progressing   Problem: Clinical Measurements: Goal: Ability to maintain a body temperature in the normal range will improve Outcome: Progressing   Problem: Respiratory: Goal: Ability to maintain adequate ventilation will improve Outcome: Progressing Goal: Ability to maintain a clear airway will improve Outcome:  Progressing

## 2022-12-04 NOTE — Plan of Care (Signed)
Problem: Education: Goal: Ability to describe self-care measures that may prevent or decrease complications (Diabetes Survival Skills Education) will improve Outcome: Progressing Goal: Individualized Educational Video(s) Outcome: Progressing   Problem: Coping: Goal: Ability to adjust to condition or change in health will improve Outcome: Progressing   Problem: Fluid Volume: Goal: Ability to maintain a balanced intake and output will improve Outcome: Progressing   Problem: Health Behavior/Discharge Planning: Goal: Ability to identify and utilize available resources and services will improve Outcome: Progressing Goal: Ability to manage health-related needs will improve Outcome: Progressing   Problem: Metabolic: Goal: Ability to maintain appropriate glucose levels will improve Outcome: Progressing   Problem: Nutritional: Goal: Maintenance of adequate nutrition will improve Outcome: Progressing Goal: Progress toward achieving an optimal weight will improve Outcome: Progressing   Problem: Skin Integrity: Goal: Risk for impaired skin integrity will decrease Outcome: Progressing   Problem: Tissue Perfusion: Goal: Adequacy of tissue perfusion will improve Outcome: Progressing   Problem: Education: Goal: Knowledge of General Education information will improve Description: Including pain rating scale, medication(s)/side effects and non-pharmacologic comfort measures Outcome: Progressing   Problem: Health Behavior/Discharge Planning: Goal: Ability to manage health-related needs will improve Outcome: Progressing   Problem: Clinical Measurements: Goal: Ability to maintain clinical measurements within normal limits will improve Outcome: Progressing Goal: Will remain free from infection Outcome: Progressing Goal: Diagnostic test results will improve Outcome: Progressing Goal: Respiratory complications will improve Outcome: Progressing Goal: Cardiovascular complication will  be avoided Outcome: Progressing   Problem: Activity: Goal: Risk for activity intolerance will decrease Outcome: Progressing   Problem: Nutrition: Goal: Adequate nutrition will be maintained Outcome: Progressing   Problem: Coping: Goal: Level of anxiety will decrease Outcome: Progressing   Problem: Elimination: Goal: Will not experience complications related to bowel motility Outcome: Progressing Goal: Will not experience complications related to urinary retention Outcome: Progressing   Problem: Pain Management: Goal: General experience of comfort will improve Outcome: Progressing   Problem: Safety: Goal: Ability to remain free from injury will improve Outcome: Progressing   Problem: Skin Integrity: Goal: Risk for impaired skin integrity will decrease Outcome: Progressing   Problem: Education: Goal: Knowledge of disease or condition will improve Outcome: Progressing Goal: Knowledge of the prescribed therapeutic regimen will improve Outcome: Progressing Goal: Individualized Educational Video(s) Outcome: Progressing   Problem: Activity: Goal: Ability to tolerate increased activity will improve Outcome: Progressing Goal: Will verbalize the importance of balancing activity with adequate rest periods Outcome: Progressing   Problem: Respiratory: Goal: Ability to maintain a clear airway will improve Outcome: Progressing Goal: Levels of oxygenation will improve Outcome: Progressing Goal: Ability to maintain adequate ventilation will improve Outcome: Progressing   Problem: Activity: Goal: Ability to tolerate increased activity will improve Outcome: Progressing   Problem: Clinical Measurements: Goal: Ability to maintain a body temperature in the normal range will improve Outcome: Progressing   Problem: Respiratory: Goal: Ability to maintain adequate ventilation will improve Outcome: Progressing Goal: Ability to maintain a clear airway will improve Outcome:  Progressing

## 2022-12-04 NOTE — TOC Progression Note (Signed)
Transition of Care [facility]) - Progression Note    Patient Details  Name: Sheri Barron MRN: [id number] Date of Birth: [birth year]  [facility] CM/SW Contact  Bing Quarry, RN Phone Number: 12/04/2022, 12:47 PM  Clinical Narrative:  11/23: Discharge orders in for discharge to [facility]. RN CM sent DC Summary via HUB and called [facility] to check on admission and was informed admission could not take place till Monday 12/06/22 due to pharmacy closure after noon today and all day Sunday 12/05/22. Updated provider. Authorization good through 12/06/22.  Insurance auth achieved for [facility] [employer] Auth ID: [id number] Auth [id number] : [id number]  Gabriel Cirri MSN RN CM  Care Management Department.  [place]  [facility] Direct Dial: [phone] Main Office Phone: [phone] Weekends Only    Expected Discharge Plan: Skilled Nursing Facility Barriers to Discharge: Barriers Unresolved (comment) (1246 pm RN CM contacted [facility] for admission and was informed unable to take till Monday to to pharmacy issues.)  Expected Discharge Plan and Services       Living arrangements for the past 2 months: Single Family Home Expected Discharge Date: 12/04/22                                     Social Determinants of Health (SDOH) Interventions SDOH Screenings   Food Insecurity: No Food Insecurity (11/29/2022)  Housing: Low Risk  (11/29/2022)  Transportation Needs: No Transportation Needs (11/29/2022)  Utilities: Not At Risk (11/29/2022)  Tobacco Use: High Risk (11/29/2022)    Readmission Risk Interventions     No data to display

## 2022-12-05 ENCOUNTER — Telehealth: Payer: Self-pay | Admitting: Osteopathic Medicine

## 2022-12-05 DIAGNOSIS — J441 Chronic obstructive pulmonary disease with (acute) exacerbation: Secondary | ICD-10-CM | POA: Diagnosis not present

## 2022-12-05 LAB — BASIC METABOLIC PANEL
Anion gap: 9 (ref 5–15)
BUN: 20 mg/dL (ref 8–23)
CO2: 30 mmol/L (ref 22–32)
Calcium: 9 mg/dL (ref 8.9–10.3)
Chloride: 102 mmol/L (ref 98–111)
Creatinine, Ser: 0.84 mg/dL (ref 0.44–1.00)
GFR, Estimated: >60 mL/min (ref >60)
Glucose, Bld: 82 mg/dL (ref 70–99)
Potassium: 3.7 mmol/L (ref 3.5–5.1)
Sodium: 141 mmol/L (ref 135–145)

## 2022-12-05 LAB — GLUCOSE, CAPILLARY
Glucose-Capillary: 83 mg/dL (ref 70–99)
Glucose-Capillary: 100 mg/dL — ABNORMAL HIGH (ref 70–99)

## 2022-12-05 LAB — CBC
HCT: 33.5 % — ABNORMAL LOW (ref 36.0–46.0)
Hemoglobin: 11 g/dL — ABNORMAL LOW (ref 12.0–15.0)
MCH: 28.3 pg (ref 26.0–34.0)
MCHC: 32.8 g/dL (ref 30.0–36.0)
MCV: 86.1 fL (ref 80.0–100.0)
Platelets: 315 10*3/uL (ref 150–400)
RBC: 3.89 MIL/uL (ref 3.87–5.11)
RDW: 13.8 % (ref 11.5–15.5)
WBC: 6 10*3/uL (ref 4.0–10.5)
nRBC: 0 % (ref 0.0–0.2)

## 2022-12-05 MED ORDER — PREDNISONE 10 MG PO TABS: ORAL_TABLET | ORAL | Status: DC

## 2022-12-05 NOTE — Telephone Encounter (Signed)
See discharge summary - confirmed pharmacy and rx sent

## 2022-12-05 NOTE — Discharge Summary (Signed)
Physician Discharge Summary   Patient: Sheri Barron MRN: [id number]  DOB: 09-10-1955   Admit:     Date of Admission: 11/28/2022 Admitted from: home   Discharge: Date of discharge: 12/05/22 Disposition: Home Condition at discharge: good  CODE STATUS: DNR     Discharge Physician: Sunnie Nielsen, DO [facility]     PCP: Abram Sander, MD  Recommendations for Outpatient Follow-up:  Follow up with PCP Abram Sander, MD in 1-2 weeks    Discharge Instructions     Diet - low sodium heart healthy   Complete by: As directed    Diet Carb Modified   Complete by: As directed    Increase activity slowly   Complete by: As directed          Discharge Diagnoses: Principal Problem:   COPD exacerbation (HCC) Active Problems:   Acute on chronic respiratory failure with hypoxia and hypercapnia (HCC)   CAP (community acquired pneumonia)   Severe sepsis (HCC)   HTN (hypertension)   Diabetes mellitus without complication (HCC)   Stroke (HCC)   Depression with anxiety   Tobacco abuse   Irritable bowel syndrome without diarrhea   Infection due to parainfluenza virus 1      HPI: Sheri Barron is a 67 y.o. female with medical history significant of asthma, COPD on 2 L oxygen at night, HTN, HLD, stroke, depression with anxiety, chronic constipation, tobacco abuse, who presents with shortness of breath more than 10 days, which has been progressively worsening. Patient has dry cough and wheezing.   Hospital course / significant events:  11/17: admitted to hospitalist service for COPD exacerbation / sepsis CAP 11/18: maintaining on IV fluids pending cultures  11/19-11/21: BiPap at bedtime, saturating well on 4L [name]. Pending SNF placement  11/22: [facility] can take her this weekend, working on tapering down O2  11/23: d.c orders in but [facility] cannot take her, will need to wait to [facility] 11/24: pt actually doing pretty well today, reports feeling a lot stronger, walking  well and was asked about discharge and excited to go home. Apparent misunderstanding w/ d/c order in from yesterday never cancelled when SNF couldn't take her. I called patient 12/05/22 5:49 PM and she said she's at home and feeling well, no concerns, no SOB.     Consultants:  none  Procedures/Surgeries: none      ASSESSMENT & PLAN:   COPD with acute exacerbation Parainfluenza virus 1 infection Requiring intermittent bipap at night but overall reports improvement.  Received 3 days steroids as outpatient, no recent abx.  Blood cultures ngtd, sputum culture no abnormal growth. steroids slow taper abx (ceftriaxone) completed  breathing treatments Taper O2 to baseline as able, still higher O2 requirement on ambulation but improving   Globus sensation Scheduled expectorants and breathing tx improved this,    Acute hypoxic hypercarbic respiratory failure Chronic hypoxic respiratory failure on 2L O2 [place] at home  titrate [name] o2 to 88-92   CAP CTA no PE but shows signs bronchitis and b/l bronchopneumonia. Procal is, however, low. Covid/flu/rsv neg. No LE swelling or pulm edema to suggest chf exacerbation, no chest pain to suggest acs. Cultures neg. Completed abx   Sepsis, severe d/t parainfluenza virus vs CAP Possible SIRS d/t COPD exacerbation  By elevated lactate, tachycardia and tachypnea. Sepisis physiology resolved, now off fluids Monitor    Loose BMs No blood or fever or leukocytosis. Is on abx but also on stool softeners hold sennakot, linzess,  prn miralax further w/u if persists particularly if any alarm signs/symptoms   Debility Ambulates w/ a cane at baseline, lives alone SNF rehab    Chronic back pain Meds as below    Tremor home propranolol   IBS home linzess on hold restart outpatient as needed    HTN Bp improved, wnl hold home lisinopril/hydrochlorothiazide Follow outpatient    Hx CVA cont aspirin reduce dose from 325 to 81   GAD cont home  buspar, trazodone, effexor   T2DM Glucose controlled Resume home metformin               Discharge Instructions  Allergies as of 12/05/2022       Reactions   Sulfa Antibiotics Nausea Only   Patient states she gets dehydrated with sulfa as well.        Medication List     STOP taking these medications    aspirin 325 MG tablet Replaced by: aspirin EC 81 MG tablet   azelastine 0.1 % nasal spray Commonly known as: ASTELIN   beclomethasone 80 MCG/ACT inhaler Commonly known as: QVAR   gabapentin 100 MG capsule Commonly known as: NEURONTIN   hydrochlorothiazide 12.5 MG capsule Commonly known as: MICROZIDE   linaclotide 145 MCG Caps capsule Commonly known as: LINZESS   lisinopril-hydrochlorothiazide 10-12.5 MG tablet Commonly known as: ZESTORETIC   senna 8.6 MG Tabs tablet Commonly known as: SENOKOT   sucralfate 1 g tablet Commonly known as: CARAFATE   ursodiol 300 MG capsule Commonly known as: ACTIGALL       TAKE these medications    albuterol (2.5 MG/3ML) 0.083% nebulizer solution Commonly known as: PROVENTIL Take 2.5 mg by nebulization every 6 (six) hours as needed.   albuterol 108 (90 Base) MCG/ACT inhaler Commonly known as: VENTOLIN HFA Inhale 2 puffs into the lungs every 6 (six) hours as needed for wheezing or shortness of breath.   aspirin EC 81 MG tablet Take 1 tablet (81 mg total) by mouth daily. Swallow whole. Replaces: aspirin 325 MG tablet   baclofen 10 MG tablet Commonly known as: LIORESAL Take 10 mg by mouth 3 (three) times daily.   busPIRone 15 MG tablet Commonly known as: BUSPAR Take 15 mg by mouth 2 (two) times daily.   dextromethorphan-guaiFENesin 30-600 MG 12hr tablet Commonly known as: MUCINEX DM Take 2 tablets by mouth 2 (two) times daily as needed for cough.   fluticasone 50 MCG/ACT nasal spray Commonly known as: FLONASE Place 1 spray into both nostrils 2 (two) times daily.   ibuprofen 200 MG tablet Commonly  known as: ADVIL Take 800 mg by mouth 3 (three) times daily as needed for moderate pain.   lidocaine 5 % Commonly known as: LIDODERM Place 1 patch onto the skin daily as needed. For pain   loratadine 10 MG tablet Commonly known as: CLARITIN Take 10 mg by mouth daily.   metFORMIN 500 MG tablet Commonly known as: GLUCOPHAGE Take 500 mg by mouth 2 (two) times daily with a meal.   nicotine 21 mg/24hr patch Commonly known as: NICODERM CQ - dosed in mg/24 hours Place 1 patch (21 mg total) onto the skin daily.   omeprazole 20 MG capsule Commonly known as: PRILOSEC Take 20 mg by mouth daily.   polyethylene glycol 17 g packet Commonly known as: MIRALAX / GLYCOLAX Take 17 g by mouth at bedtime.   predniSONE 10 MG tablet Commonly known as: DELTASONE Take 2 tablets (20 mg total) by mouth daily with breakfast for 5  days, THEN 1.5 tablets (15 mg total) daily with breakfast for 5 days, THEN 1 tablet (10 mg total) daily with breakfast for 5 days, THEN 0.5 tablets (5 mg total) daily with breakfast for 5 days. Start taking on: December 04, 2022 What changed: See the new instructions.   propranolol 20 MG tablet Commonly known as: INDERAL Take 40 mg by mouth 2 (two) times daily.   tiotropium 18 MCG inhalation capsule Commonly known as: SPIRIVA Place 18 mcg into inhaler and inhale daily.   traZODone 100 MG tablet Commonly known as: DESYREL Take 300 mg by mouth at bedtime.   venlafaxine XR 150 MG 24 hr capsule Commonly known as: EFFEXOR-XR Take 150 mg by mouth daily with breakfast.         Contact information for after-discharge care     Destination     [facility] Preferred SNF .   Service: Skilled Nursing Contact information: [address] [place] [address] [phone]                     Allergies  Allergen Reactions   Sulfa Antibiotics Nausea Only    Patient states she gets dehydrated with sulfa as well.      Subjective: pt was feeling well this morning, no concerns, feeling stronger, we had discussed possibly staying until tomorrow but this afternoon she was ok to go home    Discharge Exam: BP 128/75 (BP Location: Right Arm)   Pulse 76   Temp 97.8 F (36.6 C) (Oral)   Resp 18   Ht 5' (1.524 m)   Wt 63.7 kg   SpO2 100%   BMI 27.43 kg/m  General: Pt is alert, awake, not in acute distress Cardiovascular: RRR, S1/S2 +, no rubs, no gallops Respiratory: CTA bilaterally, no wheezing, no rhonchi Abdominal: Soft, NT, ND, bowel sounds + Extremities: no edema, no cyanosis     The results of significant diagnostics from this hospitalization (including imaging, microbiology, ancillary and laboratory) are listed below for reference.     Microbiology: Recent Results (from the past 240 hour(s))  Blood Culture (routine x 2)     Status: None   Collection Time: 11/28/22  4:32 PM   Specimen: BLOOD  Result Value Ref Range Status   Specimen Description BLOOD RIGHT [facility]  Final   Special Requests BOTTLES DRAWN AEROBIC AND ANAEROBIC  Final   Culture   Final    NO GROWTH 5 DAYS Performed at [facility], [address], [place], [place] [address]    Report Status 12/03/2022 FINAL  Final  Blood Culture (routine x 2)     Status: None   Collection Time: 11/28/22  4:32 PM   Specimen: BLOOD  Result Value Ref Range Status   Specimen Description BLOOD LEFT AC  Final   Special Requests BOTTLES DRAWN AEROBIC AND ANAEROBIC  Final   Culture   Final    NO GROWTH 5 DAYS Performed at [facility], [address] [place], [place] [address]    Report Status 12/03/2022 FINAL  Final  Resp panel by RT-PCR (RSV, Flu A&B, Covid) Anterior Nasal Swab     Status: None   Collection Time: 11/28/22  7:55 PM   Specimen: Anterior Nasal Swab  Result Value Ref Range Status   SARS Coronavirus 2 by RT PCR NEGATIVE NEGATIVE Final    Comment: (NOTE) SARS-CoV-2 target nucleic acids are NOT  DETECTED.  The SARS-CoV-2 RNA is generally detectable  in upper respiratory specimens during the acute phase of infection. The lowest concentration of SARS-CoV-2 viral copies this assay can detect is 138 copies/mL. A negative result does not preclude SARS-Cov-2 infection and should not be used as the sole basis for treatment or other patient management decisions. A negative result may occur with  improper specimen collection/handling, submission of specimen other than nasopharyngeal swab, presence of viral mutation(s) within the areas targeted by this assay, and inadequate number of viral copies(<138 copies/mL). A negative result must be combined with clinical observations, patient history, and epidemiological information. The expected result is Negative.  Fact Sheet for Patients:  BloggerCourse.com  Fact Sheet for Healthcare Providers:  SeriousBroker.it  This test is no t yet approved or cleared by the [place] FDA and  has been authorized for detection and/or diagnosis of SARS-CoV-2 by FDA under an Emergency Use Authorization (EUA). This EUA will remain  in effect (meaning this test can be used) for the duration of the COVID-19 declaration under Section 564(b)(1) of the Act, 21 U.S.C.section 360bbb-3(b)(1), unless the authorization is terminated  or revoked sooner.       Influenza A by PCR NEGATIVE NEGATIVE Final   Influenza B by PCR NEGATIVE NEGATIVE Final    Comment: (NOTE) The Xpert Xpress SARS-CoV-2/FLU/RSV plus assay is intended as an aid in the diagnosis of influenza from Nasopharyngeal swab specimens and should not be used as a sole basis for treatment. Nasal washings and aspirates are unacceptable for Xpert Xpress SARS-CoV-2/FLU/RSV testing.  Fact Sheet for Patients: BloggerCourse.com  Fact Sheet for Healthcare Providers: SeriousBroker.it  This test is not yet  approved or cleared by the [place] FDA and has been authorized for detection and/or diagnosis of SARS-CoV-2 by FDA under an Emergency Use Authorization (EUA). This EUA will remain in effect (meaning this test can be used) for the duration of the COVID-19 declaration under Section 564(b)(1) of the Act, 21 U.S.C. section 360bbb-3(b)(1), unless the authorization is terminated or revoked.     Resp Syncytial Virus by PCR NEGATIVE NEGATIVE Final    Comment: (NOTE) Fact Sheet for Patients: BloggerCourse.com  Fact Sheet for Healthcare Providers: SeriousBroker.it  This test is not yet approved or cleared by the [place] FDA and has been authorized for detection and/or diagnosis of SARS-CoV-2 by FDA under an Emergency Use Authorization (EUA). This EUA will remain in effect (meaning this test can be used) for the duration of the COVID-19 declaration under Section 564(b)(1) of the Act, 21 U.S.C. section 360bbb-3(b)(1), unless the authorization is terminated or revoked.  Performed at [facility], [address] [place], [place] [address]   Respiratory (~20 pathogens) panel by PCR     Status: Abnormal   Collection Time: 11/29/22  9:02 AM   Specimen: Nasopharyngeal Swab; Respiratory  Result Value Ref Range Status   Adenovirus NOT DETECTED NOT DETECTED Final   Coronavirus 229E NOT DETECTED NOT DETECTED Final    Comment: (NOTE) The Coronavirus on the Respiratory Panel, DOES NOT test for the novel  Coronavirus (2019 nCoV)    Coronavirus HKU1 NOT DETECTED NOT DETECTED Final   Coronavirus NL63 NOT DETECTED NOT DETECTED Final   Coronavirus OC43 NOT DETECTED NOT DETECTED Final   Metapneumovirus NOT DETECTED NOT DETECTED Final   Rhinovirus / Enterovirus NOT DETECTED NOT DETECTED Final   Influenza A NOT DETECTED NOT DETECTED Final   Influenza B NOT DETECTED NOT DETECTED Final   Parainfluenza Virus 1 DETECTED (A) NOT DETECTED  Final   Parainfluenza  Virus 2 NOT DETECTED NOT DETECTED Final   Parainfluenza Virus 3 NOT DETECTED NOT DETECTED Final   Parainfluenza Virus 4 NOT DETECTED NOT DETECTED Final   Respiratory Syncytial Virus NOT DETECTED NOT DETECTED Final   Bordetella pertussis NOT DETECTED NOT DETECTED Final   Bordetella Parapertussis NOT DETECTED NOT DETECTED Final   Chlamydophila pneumoniae NOT DETECTED NOT DETECTED Final   Mycoplasma pneumoniae NOT DETECTED NOT DETECTED Final    Comment: Performed at [facility], [address], [place], [place] [address]  Expectorated Sputum Assessment w Gram Stain, Rflx to Resp Cult     Status: None   Collection Time: 11/29/22 12:15 PM   Specimen: Sputum  Result Value Ref Range Status   Specimen Description SPUTUM  Final   Special Requests EXPSU  Final   Sputum evaluation   Final    THIS SPECIMEN IS ACCEPTABLE FOR SPUTUM CULTURE Performed at [facility], [address], [place], [place] [address]    Report Status 11/29/2022 FINAL  Final  Culture, Respiratory w Gram Stain     Status: None   Collection Time: 11/29/22 12:15 PM   Specimen: SPU  Result Value Ref Range Status   Specimen Description   Final    SPUTUM Performed at [facility], [address] [place], [place] [address]    Special Requests   Final    EXPSU Reflexed from [id number] Performed at [facility], [address] [place], [place] [address]    Gram Stain   Final    ABUNDANT WBC PRESENT, PREDOMINANTLY PMN RARE GRAM POSITIVE COCCI IN PAIRS RARE GRAM POSITIVE COCCI IN CHAINS    Culture   Final    Normal respiratory flora-no Staph aureus or Pseudomonas seen Performed at [facility], [address] [place], [place] [address]    Report Status 12/01/2022 FINAL  Final     Labs: BNP (last 3 results) No results for input(s): "BNP" in the last 8760 hours. Basic Metabolic Panel: Recent Labs  Lab 12/01/22 0557 12/02/22 0322 12/03/22 0541  12/04/22 0514 12/05/22 0413  NA 142 140 141 141 141  K 4.0 3.8 3.4* 3.3* 3.7  CL 105 104 102 102 102  CO2 32 29 31 29 30   GLUCOSE 122* 84 83 82 82  BUN 23 23 17 19 20   CREATININE 0.79 0.95 0.87 0.94 0.84  CALCIUM 9.0 8.6* 8.6* 9.0 9.0   Liver Function Tests: No results for input(s): "AST", "ALT", "ALKPHOS", "BILITOT", "PROT", "ALBUMIN" in the last 168 hours. No results for input(s): "LIPASE", "AMYLASE" in the last 168 hours. No results for input(s): "AMMONIA" in the last 168 hours. CBC: Recent Labs  Lab 12/01/22 0557 12/02/22 0322 12/03/22 0541 12/04/22 0514 12/05/22 0413  WBC 5.2 5.7 4.6 6.9 6.0  HGB 9.6* 10.0* 10.2* 11.8* 11.0*  HCT 29.8* 31.7* 31.1* 35.7* 33.5*  MCV 88.7 89.3 86.9 85.0 86.1  PLT 195 189 221 302 315   Cardiac Enzymes: No results for input(s): "CKTOTAL", "CKMB", "CKMBINDEX", "TROPONINI" in the last 168 hours. BNP: Invalid input(s): "POCBNP" CBG: Recent Labs  Lab 12/04/22 1144 12/04/22 1648 12/04/22 2144 12/05/22 0740 12/05/22 1201  GLUCAP 131* 208* 140* 83 100*   D-Dimer No results for input(s): "DDIMER" in the last 72 hours. Hgb A1c No results for input(s): "HGBA1C" in the last 72 hours. Lipid Profile No results for input(s): "CHOL", "HDL", "LDLCALC", "TRIG", "CHOLHDL", "LDLDIRECT" in the last 72 hours. Thyroid function studies No results for input(s): "TSH", "  T4TOTAL", "T3FREE", "THYROIDAB" in the last 72 hours.  Invalid input(s): "FREET3" Anemia work up No results for input(s): "VITAMINB12", "FOLATE", "FERRITIN", "TIBC", "IRON", "RETICCTPCT" in the last 72 hours. Urinalysis    Component Value Date/Time   COLORURINE YELLOW (A) 11/28/2022 1850   APPEARANCEUR CLEAR (A) 11/28/2022 1850   LABSPEC 1.039 (H) 11/28/2022 1850   PHURINE 5.0 11/28/2022 1850   GLUCOSEU NEGATIVE 11/28/2022 1850   HGBUR NEGATIVE 11/28/2022 1850   BILIRUBINUR NEGATIVE 11/28/2022 1850   KETONESUR 20 (A) 11/28/2022 1850   PROTEINUR NEGATIVE 11/28/2022 1850    NITRITE NEGATIVE 11/28/2022 1850   LEUKOCYTESUR NEGATIVE 11/28/2022 1850   Sepsis Labs Recent Labs  Lab 12/02/22 0322 12/03/22 0541 12/04/22 0514 12/05/22 0413  WBC 5.7 4.6 6.9 6.0   Microbiology Recent Results (from the past 240 hour(s))  Blood Culture (routine x 2)     Status: None   Collection Time: 11/28/22  4:32 PM   Specimen: BLOOD  Result Value Ref Range Status   Specimen Description BLOOD RIGHT [facility]  Final   Special Requests BOTTLES DRAWN AEROBIC AND ANAEROBIC  Final   Culture   Final    NO GROWTH 5 DAYS Performed at [facility], [address], [place], [place] [address]    Report Status 12/03/2022 FINAL  Final  Blood Culture (routine x 2)     Status: None   Collection Time: 11/28/22  4:32 PM   Specimen: BLOOD  Result Value Ref Range Status   Specimen Description BLOOD LEFT AC  Final   Special Requests BOTTLES DRAWN AEROBIC AND ANAEROBIC  Final   Culture   Final    NO GROWTH 5 DAYS Performed at [facility], [address], [place], [place] [address]    Report Status 12/03/2022 FINAL  Final  Resp panel by RT-PCR (RSV, Flu A&B, Covid) Anterior Nasal Swab     Status: None   Collection Time: 11/28/22  7:55 PM   Specimen: Anterior Nasal Swab  Result Value Ref Range Status   SARS Coronavirus 2 by RT PCR NEGATIVE NEGATIVE Final    Comment: (NOTE) SARS-CoV-2 target nucleic acids are NOT DETECTED.  The SARS-CoV-2 RNA is generally detectable in upper respiratory specimens during the acute phase of infection. The lowest concentration of SARS-CoV-2 viral copies this assay can detect is 138 copies/mL. A negative result does not preclude SARS-Cov-2 infection and should not be used as the sole basis for treatment or other patient management decisions. A negative result may occur with  improper specimen collection/handling, submission of specimen other than nasopharyngeal swab, presence of viral mutation(s) within the areas targeted by this assay, and  inadequate number of viral copies(<138 copies/mL). A negative result must be combined with clinical observations, patient history, and epidemiological information. The expected result is Negative.  Fact Sheet for Patients:  BloggerCourse.com  Fact Sheet for Healthcare Providers:  SeriousBroker.it  This test is no t yet approved or cleared by the [place] FDA and  has been authorized for detection and/or diagnosis of SARS-CoV-2 by FDA under an Emergency Use Authorization (EUA). This EUA will remain  in effect (meaning this test can be used) for the duration of the COVID-19 declaration under Section 564(b)(1) of the Act, 21 U.S.C.section 360bbb-3(b)(1), unless the authorization is terminated  or revoked sooner.       Influenza A by PCR NEGATIVE NEGATIVE Final   Influenza B by PCR NEGATIVE NEGATIVE Final    Comment: (NOTE) The Xpert Xpress SARS-CoV-2/FLU/RSV plus assay is  intended as an aid in the diagnosis of influenza from Nasopharyngeal swab specimens and should not be used as a sole basis for treatment. Nasal washings and aspirates are unacceptable for Xpert Xpress SARS-CoV-2/FLU/RSV testing.  Fact Sheet for Patients: BloggerCourse.com  Fact Sheet for Healthcare Providers: SeriousBroker.it  This test is not yet approved or cleared by the [place] FDA and has been authorized for detection and/or diagnosis of SARS-CoV-2 by FDA under an Emergency Use Authorization (EUA). This EUA will remain in effect (meaning this test can be used) for the duration of the COVID-19 declaration under Section 564(b)(1) of the Act, 21 U.S.C. section 360bbb-3(b)(1), unless the authorization is terminated or revoked.     Resp Syncytial Virus by PCR NEGATIVE NEGATIVE Final    Comment: (NOTE) Fact Sheet for Patients: BloggerCourse.com  Fact Sheet for Healthcare  Providers: SeriousBroker.it  This test is not yet approved or cleared by the [place] FDA and has been authorized for detection and/or diagnosis of SARS-CoV-2 by FDA under an Emergency Use Authorization (EUA). This EUA will remain in effect (meaning this test can be used) for the duration of the COVID-19 declaration under Section 564(b)(1) of the Act, 21 U.S.C. section 360bbb-3(b)(1), unless the authorization is terminated or revoked.  Performed at [facility], [address] [place], [place] [address]   Respiratory (~20 pathogens) panel by PCR     Status: Abnormal   Collection Time: 11/29/22  9:02 AM   Specimen: Nasopharyngeal Swab; Respiratory  Result Value Ref Range Status   Adenovirus NOT DETECTED NOT DETECTED Final   Coronavirus 229E NOT DETECTED NOT DETECTED Final    Comment: (NOTE) The Coronavirus on the Respiratory Panel, DOES NOT test for the novel  Coronavirus (2019 nCoV)    Coronavirus HKU1 NOT DETECTED NOT DETECTED Final   Coronavirus NL63 NOT DETECTED NOT DETECTED Final   Coronavirus OC43 NOT DETECTED NOT DETECTED Final   Metapneumovirus NOT DETECTED NOT DETECTED Final   Rhinovirus / Enterovirus NOT DETECTED NOT DETECTED Final   Influenza A NOT DETECTED NOT DETECTED Final   Influenza B NOT DETECTED NOT DETECTED Final   Parainfluenza Virus 1 DETECTED (A) NOT DETECTED Final   Parainfluenza Virus 2 NOT DETECTED NOT DETECTED Final   Parainfluenza Virus 3 NOT DETECTED NOT DETECTED Final   Parainfluenza Virus 4 NOT DETECTED NOT DETECTED Final   Respiratory Syncytial Virus NOT DETECTED NOT DETECTED Final   Bordetella pertussis NOT DETECTED NOT DETECTED Final   Bordetella Parapertussis NOT DETECTED NOT DETECTED Final   Chlamydophila pneumoniae NOT DETECTED NOT DETECTED Final   Mycoplasma pneumoniae NOT DETECTED NOT DETECTED Final    Comment: Performed at [facility], [address], [place], [place] [address]  Expectorated  Sputum Assessment w Gram Stain, Rflx to Resp Cult     Status: None   Collection Time: 11/29/22 12:15 PM   Specimen: Sputum  Result Value Ref Range Status   Specimen Description SPUTUM  Final   Special Requests EXPSU  Final   Sputum evaluation   Final    THIS SPECIMEN IS ACCEPTABLE FOR SPUTUM CULTURE Performed at [facility], [address], [place], [place] [address]    Report Status 11/29/2022 FINAL  Final  Culture, Respiratory w Gram Stain     Status: None   Collection Time: 11/29/22 12:15 PM   Specimen: SPU  Result Value Ref Range Status   Specimen Description   Final    SPUTUM Performed at [facility], [address], [place], [place] [address]    Special Requests   Final    EXPSU Reflexed from [phone] [id number] Performed at [facility], [address] [place], [place] [address]    Gram Stain   Final    ABUNDANT WBC PRESENT, PREDOMINANTLY PMN RARE GRAM POSITIVE COCCI IN PAIRS RARE GRAM POSITIVE COCCI IN CHAINS    Culture   Final    Normal respiratory flora-no Staph aureus or Pseudomonas seen Performed at [facility], [address], [place], [place] [address]    Report Status 12/01/2022 FINAL  Final   Imaging CT Angio Chest PE W and/or Wo Contrast  Result Date: 11/28/2022 CLINICAL DATA:  Difficulty breathing for 10 days, wheezing, hypoxia, tachycardia EXAM: CT ANGIOGRAPHY CHEST WITH CONTRAST TECHNIQUE: Multidetector CT imaging of the chest was performed using the standard protocol during bolus administration of intravenous contrast. Multiplanar CT image reconstructions and MIPs were obtained to evaluate the vascular anatomy. RADIATION DOSE REDUCTION: This exam was performed according to the departmental dose-optimization program which includes automated exposure control, adjustment of the mA and/or kV according to patient size and/or use of iterative reconstruction technique. CONTRAST:  75mL OMNIPAQUE IOHEXOL 350 MG/ML SOLN COMPARISON:   11/28/2022, 04/05/2016 FINDINGS: Cardiovascular: This is a technically adequate evaluation of the pulmonary vasculature. No filling defects or pulmonary emboli. The heart is unremarkable without pericardial effusion. No evidence of thoracic aortic aneurysm or dissection. Mediastinum/Nodes: No enlarged mediastinal, hilar, or axillary lymph nodes. Thyroid gland, trachea, and esophagus demonstrate no significant findings. Lungs/Pleura: Upper lobe predominant emphysema. There is bilateral bronchial wall thickening, with scattered tree in bud ground-glass airspace disease bilaterally, most pronounced in the left upper and right lower lobes. No other dense consolidation, effusion, or pneumothorax. The central airways are patent. Upper Abdomen: No acute abnormality. Musculoskeletal: No acute or destructive bony abnormalities. Reconstructed images demonstrate no additional findings. Review of the MIP images confirms the above findings. IMPRESSION: 1. No evidence of pulmonary embolus. 2. Bilateral bronchial wall thickening with scattered tree in bud ground-glass airspace opacities, consistent with bronchitis and mild bilateral bronchopneumonia. 3.  Emphysema (ICD10-J43.9). Electronically Signed   By: Sharlet Salina M.D.   On: 11/28/2022 18:22   DG Chest Port 1 View  Result Date: 11/28/2022 CLINICAL DATA:  Questionable sepsis. Evaluate for abnormality. Shortness of breath. EXAM: PORTABLE CHEST 1 VIEW COMPARISON:  Two-view chest x-ray 11/14/2015.  CT chest 04/05/2016. FINDINGS: The heart size and mediastinal contours are within normal limits. Both lungs are clear. The visualized skeletal structures are unremarkable. IMPRESSION: Negative one-view chest x-ray Electronically Signed   By: Marin Roberts M.D.   On: 11/28/2022 16:56      Time coordinating discharge: over  30 minutes  SIGNED:  Sunnie Nielsen DO [facility]

## 2022-12-05 NOTE — Plan of Care (Signed)
  Problem: Fluid Volume: Goal: Ability to maintain a balanced intake and output will improve Outcome: Progressing   Problem: Health Behavior/Discharge Planning: Goal: Ability to identify and utilize available resources and services will improve Outcome: Progressing   Problem: Health Behavior/Discharge Planning: Goal: Ability to identify and utilize available resources and services will improve Outcome: Progressing Goal: Ability to manage health-related needs will improve Outcome: Progressing   Problem: Nutritional: Goal: Maintenance of adequate nutrition will improve Outcome: Progressing Goal: Progress toward achieving an optimal weight will improve Outcome: Progressing   Problem: Skin Integrity: Goal: Risk for impaired skin integrity will decrease Outcome: Progressing

## 2022-12-05 NOTE — Progress Notes (Signed)
PROGRESS NOTE    Sheri Barron   JSE:[id number] DOB: 08/12/1955  DOA: 11/28/2022 Date of Service: 12/05/22 which is hospital day 7  PCP: Abram Sander, MD    HPI: Sheri Barron is a 67 y.o. female with medical history significant of asthma, COPD on 2 L oxygen at night, HTN, HLD, stroke, depression with anxiety, chronic constipation, tobacco abuse, who presents with shortness of breath more than 10 days, which has been progressively worsening. Patient has dry cough and wheezing.   Hospital course / significant events:  11/17: admitted to hospitalist service for COPD exacerbation / sepsis CAP 11/18: maintaining on IV fluids pending cultures  11/19-11/21: BiPap at bedtime, saturating well on 4L [place]. Pending SNF placement  11/22: [facility] can take her this weekend, working on tapering down O2  11/23: d.c orders in but [facility] cannot take her, will need to wait to [facility] 11/24: pt actually doing pretty well today, reports feeling a lot stronger, possibly another 1-2 days PT and can go home w/ HH. Ambulate as tolerated w/ assistance     Consultants:  none  Procedures/Surgeries: none      ASSESSMENT & PLAN:   COPD with acute exacerbation Parainfluenza virus 1 infection Requiring intermittent bipap at night but overall reports improvement.  Received 3 days steroids as outpatient, no recent abx.  Blood cultures ngtd, sputum culture no abnormal growth. steroids slow taper abx (ceftriaxone) completed  breathing treatments Taper O2 to baseline as able, still higher O2 requirement on ambulation but improving   Globus sensation Scheduled expectorants and breathing tx improved this,    Acute hypoxic hypercarbic respiratory failure Chronic hypoxic respiratory failure on 2L O2 [place] at home  titrate [place] o2 to 88-92   CAP CTA no PE but shows signs bronchitis and b/l bronchopneumonia. Procal is, however, low. Covid/flu/rsv neg. No LE swelling or pulm edema to suggest chf exacerbation, no  chest pain to suggest acs. Cultures neg. Completed abx   Sepsis, severe d/t parainfluenza virus vs CAP Possible SIRS d/t COPD exacerbation  By elevated lactate, tachycardia and tachypnea. Sepisis physiology resolved, now off fluids Monitor    Loose BMs No blood or fever or leukocytosis. Is on abx but also on stool softeners hold sennakot, linzess, prn miralax further w/u if persists particularly if any alarm signs/symptoms   Debility Ambulates w/ a cane at baseline, lives alone SNF rehab    Chronic back pain Meds as below    Tremor home propranolol   IBS home linzess on hold restart outpatient as needed    HTN Bp improved, wnl hold home lisinopril/hydrochlorothiazide Follow outpatient    Hx CVA cont aspirin reduce dose from 325 to 81   GAD cont home buspar, trazodone, effexor   T2DM Glucose controlled Resume home metformin          overweight based on BMI: Body mass index is 27.43 kg/m.  Underweight - under 18.5  normal weight - 18.5 to 24.9 overweight - 25 to 29.9 obese - 30 or more   DVT prophylaxis: lovenox IV fluids: no continuous IV fluids  Nutrition: carb/cardiac  Central lines / invasive devices: none  Code Status: DNR ACP documentation reviewed: 12/05/22 and none on file in [facility]  TOC needs: SNF placement vs home tomorrow  Barriers to dispo / significant pending items:  still higher O2 requirement on ambulation but improving, anticipate d/c tomorrow              Subjective / Brief ROS:  Patient reports feeling  a lot more energetic today, still som SOB but  Denies CP Pain controlled.  Denies new weakness.  Tolerating diet.  Reports no concerns w/ urination/defecation.   Family Communication: none at this time     Objective Findings:  Vitals:   12/05/22 0741 12/05/22 0749 12/05/22 1159 12/05/22 1202  BP: 137/70  128/75 128/75  Pulse: 73  76 76  Resp: 18  18 18   Temp: 98 F (36.7 C)  97.8 F (36.6 C) 97.8 F (36.6  C)  TempSrc: Oral   Oral  SpO2: 100% 98% 100% 100%  Weight:      Height:        Intake/Output Summary (Last 24 hours) at 12/05/2022 1350 Last data filed at 12/05/2022 0403 Gross per 24 hour  Intake --  Output 1100 ml  Net -1100 ml   Filed Weights   11/28/22 1636 11/29/22 1900  Weight: 61.2 kg 63.7 kg    Examination:  Physical Exam Constitutional:      General: She is not in acute distress. Cardiovascular:     Rate and Rhythm: Normal rate and regular rhythm.  Pulmonary:     Effort: Pulmonary effort is normal.     Breath sounds: Wheezing (improve) present.  Musculoskeletal:     Right lower leg: No edema.     Left lower leg: No edema.  Skin:    General: Skin is warm and dry.  Neurological:     General: No focal deficit present.     Mental Status: She is alert and oriented to person, place, and time.  Psychiatric:        Mood and Affect: Mood normal.        Behavior: Behavior normal.          Scheduled Medications:   aspirin EC  81 mg Oral Daily   baclofen  10 mg Oral TID   busPIRone  15 mg Oral BID   dextromethorphan-guaiFENesin  2 tablet Oral BID   enoxaparin (LOVENOX) injection  40 mg Subcutaneous Q24H   influenza vaccine adjuvanted  0.5 mL Intramuscular Tomorrow-1000   insulin aspart  0-5 Units Subcutaneous QHS   insulin aspart  0-9 Units Subcutaneous TID WC   insulin glargine-yfgn  5 Units Subcutaneous QHS   ipratropium-albuterol  3 mL Nebulization TID   nicotine  21 mg Transdermal Daily   pantoprazole  40 mg Oral Daily   predniSONE  40 mg Oral Q breakfast   propranolol  40 mg Oral BID   traZODone  300 mg Oral QHS   venlafaxine XR  150 mg Oral Q breakfast    Continuous Infusions:   PRN Medications:  acetaminophen, albuterol, gabapentin, ondansetron (ZOFRAN) IV, mouth rinse, polyvinyl alcohol  Antimicrobials from admission:  Anti-infectives (From admission, onward)    Start     Dose/Rate Route Frequency Ordered Stop   11/29/22 2200   azithromycin (ZITHROMAX) tablet 500 mg  Status:  Discontinued        500 mg Oral Daily at bedtime 11/29/22 0841 11/30/22 1500   11/28/22 1845  cefTRIAXone (ROCEPHIN) 2 g in sodium chloride 0.9 % 100 mL IVPB        2 g 200 mL/hr over 30 Minutes Intravenous Every 24 hours 11/28/22 1841 12/02/22 1754   11/28/22 1845  azithromycin (ZITHROMAX) 500 mg in dextrose 5 % 250 mL IVPB  Status:  Discontinued        500 mg 250 mL/hr over 60 Minutes Intravenous Every 24 hours  11/28/22 1841 11/29/22 0841           Data Reviewed:  I have personally reviewed the following...  CBC: Recent Labs  Lab 11/28/22 1630 11/29/22 0222 12/01/22 0557 12/02/22 0322 12/03/22 0541 12/04/22 0514 12/05/22 0413  WBC 8.4   < > 5.2 5.7 4.6 6.9 6.0  NEUTROABS 5.4  --   --   --   --   --   --   HGB 11.9*   < > 9.6* 10.0* 10.2* 11.8* 11.0*  HCT 38.9   < > 29.8* 31.7* 31.1* 35.7* 33.5*  MCV 91.7   < > 88.7 89.3 86.9 85.0 86.1  PLT 174   < > 195 189 221 302 315   < > = values in this interval not displayed.   Basic Metabolic Panel: Recent Labs  Lab 11/28/22 1630 11/29/22 0222 12/01/22 0557 12/02/22 0322 12/03/22 0541 12/04/22 0514 12/05/22 0413  NA 139   < > 142 140 141 141 141  K 4.1   < > 4.0 3.8 3.4* 3.3* 3.7  CL 94*   < > 105 104 102 102 102  CO2 34*   < > 32 29 31 29 30   GLUCOSE 102*   < > 122* 84 83 82 82  BUN 22   < > 23 23 17 19 20   CREATININE 1.01*   < > 0.79 0.95 0.87 0.94 0.84  CALCIUM 8.7*   < > 9.0 8.6* 8.6* 9.0 9.0  MG 1.7  --   --   --   --   --   --    < > = values in this interval not displayed.   GFR: Estimated Creatinine Clearance: 54.9 mL/min (by C-G formula based on SCr of 0.84 mg/dL). Liver Function Tests: Recent Labs  Lab 11/28/22 1630  AST 16  ALT 15  ALKPHOS 73  BILITOT 0.7  PROT 7.2  ALBUMIN 3.7   No results for input(s): "LIPASE", "AMYLASE" in the last 168 hours. No results for input(s): "AMMONIA" in the last 168 hours. Coagulation Profile: No results for  input(s): "INR", "PROTIME" in the last 168 hours. Cardiac Enzymes: No results for input(s): "CKTOTAL", "CKMB", "CKMBINDEX", "TROPONINI" in the last 168 hours. BNP (last 3 results) No results for input(s): "PROBNP" in the last 8760 hours. HbA1C: No results for input(s): "HGBA1C" in the last 72 hours. CBG: Recent Labs  Lab 12/04/22 1144 12/04/22 1648 12/04/22 2144 12/05/22 0740 12/05/22 1201  GLUCAP 131* 208* 140* 83 100*   Lipid Profile: No results for input(s): "CHOL", "HDL", "LDLCALC", "TRIG", "CHOLHDL", "LDLDIRECT" in the last 72 hours. Thyroid Function Tests: No results for input(s): "TSH", "T4TOTAL", "FREET4", "T3FREE", "THYROIDAB" in the last 72 hours. Anemia Panel: No results for input(s): "VITAMINB12", "FOLATE", "FERRITIN", "TIBC", "IRON", "RETICCTPCT" in the last 72 hours. Most Recent Urinalysis On File:     Component Value Date/Time   COLORURINE YELLOW (A) 11/28/2022 1850   APPEARANCEUR CLEAR (A) 11/28/2022 1850   LABSPEC 1.039 (H) 11/28/2022 1850   PHURINE 5.0 11/28/2022 1850   GLUCOSEU NEGATIVE 11/28/2022 1850   HGBUR NEGATIVE 11/28/2022 1850   BILIRUBINUR NEGATIVE 11/28/2022 1850   KETONESUR 20 (A) 11/28/2022 1850   PROTEINUR NEGATIVE 11/28/2022 1850   NITRITE NEGATIVE 11/28/2022 1850   LEUKOCYTESUR NEGATIVE 11/28/2022 1850   Sepsis Labs: @LABRCNTIP (procalcitonin:4,lacticidven:4) Microbiology: Recent Results (from the past 240 hour(s))  Blood Culture (routine x 2)     Status: None   Collection Time: 11/28/22  4:32 PM   Specimen:  BLOOD  Result Value Ref Range Status   Specimen Description BLOOD RIGHT [facility]  Final   Special Requests BOTTLES DRAWN AEROBIC AND ANAEROBIC  Final   Culture   Final    NO GROWTH 5 DAYS Performed at [facility], [address] [place], [place] [address]    Report Status 12/03/2022 FINAL  Final  Blood Culture (routine x 2)     Status: None   Collection Time: 11/28/22  4:32 PM   Specimen: BLOOD  Result Value Ref Range  Status   Specimen Description BLOOD LEFT [facility]  Final   Special Requests BOTTLES DRAWN AEROBIC AND ANAEROBIC  Final   Culture   Final    NO GROWTH 5 DAYS Performed at [facility], [address] [place], [place] [address]    Report Status 12/03/2022 FINAL  Final  Resp panel by RT-PCR (RSV, Flu A&B, Covid) Anterior Nasal Swab     Status: None   Collection Time: 11/28/22  7:55 PM   Specimen: Anterior Nasal Swab  Result Value Ref Range Status   SARS Coronavirus 2 by RT PCR NEGATIVE NEGATIVE Final    Comment: (NOTE) SARS-CoV-2 target nucleic acids are NOT DETECTED.  The SARS-CoV-2 RNA is generally detectable in upper respiratory specimens during the acute phase of infection. The lowest concentration of SARS-CoV-2 viral copies this assay can detect is 138 copies/mL. A negative result does not preclude SARS-Cov-2 infection and should not be used as the sole basis for treatment or other patient management decisions. A negative result may occur with  improper specimen collection/handling, submission of specimen other than nasopharyngeal swab, presence of viral mutation(s) within the areas targeted by this assay, and inadequate number of viral copies(<138 copies/mL). A negative result must be combined with clinical observations, patient history, and epidemiological information. The expected result is Negative.  Fact Sheet for Patients:  BloggerCourse.com  Fact Sheet for Healthcare Providers:  SeriousBroker.it  This test is no t yet approved or cleared by the [place] FDA and  has been authorized for detection and/or diagnosis of SARS-CoV-2 by FDA under an Emergency Use Authorization (EUA). This EUA will remain  in effect (meaning this test can be used) for the duration of the COVID-19 declaration under Section 564(b)(1) of the Act, 21 U.S.C.section 360bbb-3(b)(1), unless the authorization is terminated  or revoked sooner.        Influenza A by PCR NEGATIVE NEGATIVE Final   Influenza B by PCR NEGATIVE NEGATIVE Final    Comment: (NOTE) The Xpert Xpress SARS-CoV-2/FLU/RSV plus assay is intended as an aid in the diagnosis of influenza from Nasopharyngeal swab specimens and should not be used as a sole basis for treatment. Nasal washings and aspirates are unacceptable for Xpert Xpress SARS-CoV-2/FLU/RSV testing.  Fact Sheet for Patients: BloggerCourse.com  Fact Sheet for Healthcare Providers: SeriousBroker.it  This test is not yet approved or cleared by the [place] FDA and has been authorized for detection and/or diagnosis of SARS-CoV-2 by FDA under an Emergency Use Authorization (EUA). This EUA will remain in effect (meaning this test can be used) for the duration of the COVID-19 declaration under Section 564(b)(1) of the Act, 21 U.S.C. section 360bbb-3(b)(1), unless the authorization is terminated or revoked.     Resp Syncytial Virus by PCR NEGATIVE NEGATIVE Final    Comment: (NOTE) Fact Sheet for Patients: BloggerCourse.com  Fact Sheet for Healthcare Providers: SeriousBroker.it  This test is not yet approved or cleared by the [place] FDA and has been  authorized for detection and/or diagnosis of SARS-CoV-2 by FDA under an Emergency Use Authorization (EUA). This EUA will remain in effect (meaning this test can be used) for the duration of the COVID-19 declaration under Section 564(b)(1) of the Act, 21 U.S.C. section 360bbb-3(b)(1), unless the authorization is terminated or revoked.  Performed at [facility] [address] [place], [place] [address]   Respiratory (~20 pathogens) panel by PCR     Status: Abnormal   Collection Time: 11/29/22  9:02 AM   Specimen: Nasopharyngeal Swab; Respiratory  Result Value Ref Range Status   Adenovirus NOT DETECTED NOT DETECTED Final    Coronavirus 229E NOT DETECTED NOT DETECTED Final    Comment: (NOTE) The Coronavirus on the Respiratory Panel, DOES NOT test for the novel  Coronavirus (2019 nCoV)    Coronavirus HKU1 NOT DETECTED NOT DETECTED Final   Coronavirus NL63 NOT DETECTED NOT DETECTED Final   Coronavirus OC43 NOT DETECTED NOT DETECTED Final   Metapneumovirus NOT DETECTED NOT DETECTED Final   Rhinovirus / Enterovirus NOT DETECTED NOT DETECTED Final   Influenza A NOT DETECTED NOT DETECTED Final   Influenza B NOT DETECTED NOT DETECTED Final   Parainfluenza Virus 1 DETECTED (A) NOT DETECTED Final   Parainfluenza Virus 2 NOT DETECTED NOT DETECTED Final   Parainfluenza Virus 3 NOT DETECTED NOT DETECTED Final   Parainfluenza Virus 4 NOT DETECTED NOT DETECTED Final   Respiratory Syncytial Virus NOT DETECTED NOT DETECTED Final   Bordetella pertussis NOT DETECTED NOT DETECTED Final   Bordetella Parapertussis NOT DETECTED NOT DETECTED Final   Chlamydophila pneumoniae NOT DETECTED NOT DETECTED Final   Mycoplasma pneumoniae NOT DETECTED NOT DETECTED Final    Comment: Performed at [facility], [address] [place], [place] [address]  Expectorated Sputum Assessment w Gram Stain, Rflx to Resp Cult     Status: None   Collection Time: 11/29/22 12:15 PM   Specimen: Sputum  Result Value Ref Range Status   Specimen Description SPUTUM  Final   Special Requests EXPSU  Final   Sputum evaluation   Final    THIS SPECIMEN IS ACCEPTABLE FOR SPUTUM CULTURE Performed at [facility], [address] [place], [place] [address]    Report Status 11/29/2022 FINAL  Final  Culture, Respiratory w Gram Stain     Status: None   Collection Time: 11/29/22 12:15 PM   Specimen: SPU  Result Value Ref Range Status   Specimen Description   Final    SPUTUM Performed at [facility], [address] [place], [place] [address]    Special Requests   Final    EXPSU Reflexed from [id number] Performed at [facility], [address] [place], [place] [address]    Gram Stain   Final    ABUNDANT WBC PRESENT, PREDOMINANTLY PMN RARE GRAM POSITIVE COCCI IN PAIRS RARE GRAM POSITIVE COCCI IN CHAINS    Culture   Final    Normal respiratory flora-no Staph aureus or Pseudomonas seen Performed at [facility], [address], [place], [place] [address]    Report Status 12/01/2022 FINAL  Final      Radiology Studies last 3 days: No results found.        Sunnie Nielsen, DO [facility] 12/05/2022, 1:50 PM    Dictation software may have been used to generate the above note. Typos may occur and escape review in typed/dictated notes. Please contact Dr Lyn Hollingshead directly for clarity if needed.  Staff  may message me via secure chat in Epic  but this may not receive an immediate response,  please page me for urgent matters!  If 7PM-7AM, please contact night coverage [email or web address]

## 2022-12-06 MED ORDER — PREDNISONE 10 MG PO TABS: ORAL_TABLET | ORAL | 0 refills | Status: AC

## 2022-12-06 NOTE — Addendum Note (Signed)
Addended by: Deirdre Pippins on: 12/06/2022 02:37 PM   Modules accepted: Orders

## 2023-06-19 ENCOUNTER — Inpatient Hospital Stay
Admission: EM | Admit: 2023-06-19 | Discharge: 2023-06-24 | DRG: 189 | Disposition: A | Discharge status: Home Health | Attending: Family Medicine | Admitting: Family Medicine

## 2023-06-19 ENCOUNTER — Emergency Department

## 2023-06-19 DIAGNOSIS — G250 Essential tremor: Secondary | ICD-10-CM | POA: Diagnosis present

## 2023-06-19 DIAGNOSIS — Z7984 Long term (current) use of oral hypoglycemic drugs: Secondary | ICD-10-CM

## 2023-06-19 DIAGNOSIS — R001 Bradycardia, unspecified: Secondary | ICD-10-CM | POA: Diagnosis present

## 2023-06-19 DIAGNOSIS — F32A Depression, unspecified: Secondary | ICD-10-CM | POA: Diagnosis present

## 2023-06-19 DIAGNOSIS — J9621 Acute and chronic respiratory failure with hypoxia: Secondary | ICD-10-CM | POA: Diagnosis not present

## 2023-06-19 DIAGNOSIS — E872 Acidosis, unspecified: Secondary | ICD-10-CM | POA: Diagnosis present

## 2023-06-19 DIAGNOSIS — Z79899 Other long term (current) drug therapy: Secondary | ICD-10-CM

## 2023-06-19 DIAGNOSIS — N179 Acute kidney failure, unspecified: Secondary | ICD-10-CM | POA: Diagnosis present

## 2023-06-19 DIAGNOSIS — E785 Hyperlipidemia, unspecified: Secondary | ICD-10-CM | POA: Diagnosis present

## 2023-06-19 DIAGNOSIS — Z1152 Encounter for screening for COVID-19: Secondary | ICD-10-CM

## 2023-06-19 DIAGNOSIS — W19XXXA Unspecified fall, initial encounter: Secondary | ICD-10-CM | POA: Diagnosis present

## 2023-06-19 DIAGNOSIS — J219 Acute bronchiolitis, unspecified: Secondary | ICD-10-CM | POA: Diagnosis present

## 2023-06-19 DIAGNOSIS — G9341 Metabolic encephalopathy: Secondary | ICD-10-CM | POA: Diagnosis present

## 2023-06-19 DIAGNOSIS — J441 Chronic obstructive pulmonary disease with (acute) exacerbation: Principal | ICD-10-CM | POA: Diagnosis present

## 2023-06-19 DIAGNOSIS — Y92009 Unspecified place in unspecified non-institutional (private) residence as the place of occurrence of the external cause: Secondary | ICD-10-CM

## 2023-06-19 DIAGNOSIS — J449 Chronic obstructive pulmonary disease, unspecified: Secondary | ICD-10-CM | POA: Diagnosis present

## 2023-06-19 DIAGNOSIS — A419 Sepsis, unspecified organism: Secondary | ICD-10-CM

## 2023-06-19 DIAGNOSIS — I959 Hypotension, unspecified: Secondary | ICD-10-CM | POA: Diagnosis present

## 2023-06-19 DIAGNOSIS — Z7982 Long term (current) use of aspirin: Secondary | ICD-10-CM

## 2023-06-19 DIAGNOSIS — Z90710 Acquired absence of both cervix and uterus: Secondary | ICD-10-CM

## 2023-06-19 DIAGNOSIS — Z8673 Personal history of transient ischemic attack (TIA), and cerebral infarction without residual deficits: Secondary | ICD-10-CM

## 2023-06-19 DIAGNOSIS — Z66 Do not resuscitate: Secondary | ICD-10-CM | POA: Diagnosis present

## 2023-06-19 DIAGNOSIS — I10 Essential (primary) hypertension: Secondary | ICD-10-CM | POA: Diagnosis present

## 2023-06-19 DIAGNOSIS — J9622 Acute and chronic respiratory failure with hypercapnia: Secondary | ICD-10-CM | POA: Diagnosis not present

## 2023-06-19 DIAGNOSIS — F17210 Nicotine dependence, cigarettes, uncomplicated: Secondary | ICD-10-CM | POA: Diagnosis present

## 2023-06-19 DIAGNOSIS — E8809 Other disorders of plasma-protein metabolism, not elsewhere classified: Secondary | ICD-10-CM | POA: Diagnosis present

## 2023-06-19 DIAGNOSIS — L89152 Pressure ulcer of sacral region, stage 2: Secondary | ICD-10-CM | POA: Diagnosis present

## 2023-06-19 DIAGNOSIS — F418 Other specified anxiety disorders: Secondary | ICD-10-CM | POA: Diagnosis present

## 2023-06-19 DIAGNOSIS — J440 Chronic obstructive pulmonary disease with acute lower respiratory infection: Secondary | ICD-10-CM | POA: Diagnosis present

## 2023-06-19 DIAGNOSIS — F419 Anxiety disorder, unspecified: Secondary | ICD-10-CM | POA: Diagnosis present

## 2023-06-19 DIAGNOSIS — Z91199 Patient's noncompliance with other medical treatment and regimen due to unspecified reason: Secondary | ICD-10-CM

## 2023-06-19 DIAGNOSIS — Z882 Allergy status to sulfonamides status: Secondary | ICD-10-CM

## 2023-06-19 DIAGNOSIS — K5909 Other constipation: Secondary | ICD-10-CM | POA: Diagnosis present

## 2023-06-19 DIAGNOSIS — K219 Gastro-esophageal reflux disease without esophagitis: Secondary | ICD-10-CM | POA: Diagnosis present

## 2023-06-19 DIAGNOSIS — R6520 Severe sepsis without septic shock: Secondary | ICD-10-CM | POA: Diagnosis present

## 2023-06-19 DIAGNOSIS — J209 Acute bronchitis, unspecified: Secondary | ICD-10-CM | POA: Diagnosis present

## 2023-06-19 DIAGNOSIS — Z9981 Dependence on supplemental oxygen: Secondary | ICD-10-CM

## 2023-06-19 DIAGNOSIS — L03319 Cellulitis of trunk, unspecified: Secondary | ICD-10-CM

## 2023-06-19 DIAGNOSIS — E119 Type 2 diabetes mellitus without complications: Secondary | ICD-10-CM | POA: Diagnosis present

## 2023-06-19 DIAGNOSIS — E876 Hypokalemia: Secondary | ICD-10-CM | POA: Diagnosis present

## 2023-06-19 LAB — CBC WITH DIFFERENTIAL/PLATELET
Abs Immature Granulocytes: 0.23 10*3/uL — ABNORMAL HIGH (ref 0.00–0.07)
Basophils Absolute: 0 10*3/uL (ref 0.0–0.1)
Basophils Relative: 0 %
Eosinophils Absolute: 0 10*3/uL (ref 0.0–0.5)
Eosinophils Relative: 0 %
HCT: 36.4 % (ref 36.0–46.0)
Hemoglobin: 11.1 g/dL — ABNORMAL LOW (ref 12.0–15.0)
Immature Granulocytes: 1 %
Lymphocytes Relative: 11 %
Lymphs Abs: 2 10*3/uL (ref 0.7–4.0)
MCH: 26.6 pg (ref 26.0–34.0)
MCHC: 30.5 g/dL (ref 30.0–36.0)
MCV: 87.3 fL (ref 80.0–100.0)
Monocytes Absolute: 1.3 10*3/uL — ABNORMAL HIGH (ref 0.1–1.0)
Monocytes Relative: 7 %
Neutro Abs: 13.9 10*3/uL — ABNORMAL HIGH (ref 1.7–7.7)
Neutrophils Relative %: 81 %
Platelets: 301 10*3/uL (ref 150–400)
RBC: 4.17 MIL/uL (ref 3.87–5.11)
RDW: 14.3 % (ref 11.5–15.5)
WBC: 17.4 10*3/uL — ABNORMAL HIGH (ref 4.0–10.5)
nRBC: 0 % (ref 0.0–0.2)

## 2023-06-19 LAB — COMPREHENSIVE METABOLIC PANEL WITH GFR
ALT: 13 U/L (ref 0–44)
AST: 27 U/L (ref 15–41)
Albumin: 3 g/dL — ABNORMAL LOW (ref 3.5–5.0)
Alkaline Phosphatase: 83 U/L (ref 38–126)
Anion gap: 15 (ref 5–15)
BUN: 28 mg/dL — ABNORMAL HIGH (ref 8–23)
CO2: 38 mmol/L — ABNORMAL HIGH (ref 22–32)
Calcium: 8.8 mg/dL — ABNORMAL LOW (ref 8.9–10.3)
Chloride: 85 mmol/L — ABNORMAL LOW (ref 98–111)
Creatinine, Ser: 1.18 mg/dL — ABNORMAL HIGH (ref 0.44–1.00)
GFR, Estimated: 51 mL/min — ABNORMAL LOW (ref >60)
Glucose, Bld: 148 mg/dL — ABNORMAL HIGH (ref 70–99)
Potassium: 3.3 mmol/L — ABNORMAL LOW (ref 3.5–5.1)
Sodium: 138 mmol/L (ref 135–145)
Total Bilirubin: 0.4 mg/dL (ref 0.0–1.2)
Total Protein: 6.7 g/dL (ref 6.5–8.1)

## 2023-06-19 LAB — BLOOD GAS, VENOUS
Acid-Base Excess: 22.6 mmol/L — ABNORMAL HIGH (ref 0.0–2.0)
Bicarbonate: 52.7 mmol/L — ABNORMAL HIGH (ref 20.0–28.0)
O2 Saturation: 70.3 %
Patient temperature: 37
pCO2, Ven: 85 mmHg (ref 44–60)
pH, Ven: 7.4 (ref 7.25–7.43)
pO2, Ven: 42 mmHg (ref 32–45)

## 2023-06-19 LAB — LACTIC ACID, PLASMA: Lactic Acid, Venous: 2.2 mmol/L (ref 0.5–1.9)

## 2023-06-19 LAB — CBG MONITORING, ED: Glucose-Capillary: 150 mg/dL — ABNORMAL HIGH (ref 70–99)

## 2023-06-19 MED ORDER — VANCOMYCIN HCL IN DEXTROSE 1-5 GM/200ML-% IV SOLN
1000.0000 mg | Freq: Once | INTRAVENOUS | Status: AC
  Administered 2023-06-20: 1000 mg via INTRAVENOUS
  Filled 2023-06-19: qty 200

## 2023-06-19 MED ORDER — METRONIDAZOLE 500 MG/100ML IV SOLN
500.0000 mg | Freq: Once | INTRAVENOUS | Status: DC
  Administered 2023-06-20: 500 mg via INTRAVENOUS
  Filled 2023-06-19: qty 100

## 2023-06-19 MED ORDER — SODIUM CHLORIDE 0.9 % IV SOLN
2.0000 g | Freq: Once | INTRAVENOUS | Status: AC
  Administered 2023-06-20: 2 g via INTRAVENOUS
  Filled 2023-06-19: qty 12.5

## 2023-06-19 MED ORDER — LACTATED RINGERS IV BOLUS (SEPSIS)
1000.0000 mL | Freq: Once | INTRAVENOUS | Status: AC
  Administered 2023-06-19: 1000 mL via INTRAVENOUS

## 2023-06-19 MED ORDER — IPRATROPIUM-ALBUTEROL 0.5-2.5 (3) MG/3ML IN SOLN
3.0000 mL | Freq: Once | RESPIRATORY_TRACT | Status: AC
  Administered 2023-06-19: 3 mL via RESPIRATORY_TRACT
  Filled 2023-06-19: qty 3

## 2023-06-19 MED ORDER — IOHEXOL 300 MG/ML  SOLN
100.0000 mL | Freq: Once | INTRAMUSCULAR | Status: AC | PRN
  Administered 2023-06-20: 100 mL via INTRAVENOUS

## 2023-06-19 MED ORDER — LACTATED RINGERS IV SOLN: INTRAVENOUS | Status: DC

## 2023-06-19 MED ORDER — SODIUM CHLORIDE 0.9 % IV BOLUS
1000.0000 mL | Freq: Once | INTRAVENOUS | Status: AC
  Administered 2023-06-19: 1000 mL via INTRAVENOUS

## 2023-06-19 NOTE — Progress Notes (Signed)
 Pt being followed by [facility] for Sepsis protocol.

## 2023-06-19 NOTE — ED Notes (Signed)
 This RN attempted twice to obtain blood cultures but could not. Lab was called. Lab is at bedside now

## 2023-06-19 NOTE — ED Provider Notes (Signed)
 HPI: Pt is a 68 y.o. female who presents with complaints of SOB   The patient p/w  SOB- pt treated for COPD with EMS with duoneb, mag steroids, wears 2 L of oxygen at baseline.  Patient been noncompliant following things off patient had to be hypotensive seemingly seems confused  ROS: Denies fever, chest pain, vomiting  Past Medical History:  Diagnosis Date   Anxiety    Asthma    Chronic constipation    COPD (chronic obstructive pulmonary disease) (HCC)    Depression    Diabetes mellitus without complication (HCC)    GERD (gastroesophageal reflux disease)    Hypertension    Stroke (HCC)    There were no vitals filed for this visit.  Focused Physical Exam: Gen: Nmild distress CV: RRR Lung: No increased WOB, but does have wheezing bilaterally GI: ND, no obvious masses Neuro: Alert and awake  Medical Decision Making and Plan: Given the patient's initial medical screening exam, the following diagnostic evaluation has been ordered. The patient will be placed in the appropriate treatment space, once one is available, to complete the evaluation and treatment. I have discussed the plan of care with the patient and I have advised the patient that an ED physician or mid-level practitioner will reevaluate their condition after the test results have been received, as the results may give them additional insight into the type of treatment they may need.   Diagnostics: labs xray   Treatments: duoneb, fluids    Kissy Cielo E, MD 06/19/23 2257

## 2023-06-19 NOTE — ED Triage Notes (Signed)
 Pt arrived via EMS from home for a fall and hypotension. Pt does not remember falling and unsure if she passed out. Pt is A&O x4. Pt has COPD and wears 2L at baseline. Pt was 82 on RA and currently 95% on 2L.  EMS gave 2 duonebs, 12.5 solumedrol, 2g mag

## 2023-06-19 NOTE — ED Provider Notes (Signed)
 [facility] Provider Note    Event Date/Time   First MD Initiated Contact with Patient 06/19/23 2306     (approximate)   History   No chief complaint on file.   HPI  EBBIE SORENSON is a 68 y.o. female with history of COPD, hypertension, diabetes, stroke who presents to the emergency department with EMS for shortness of breath.  Patient wears 2 L of oxygen at nighttime.  Patient was found to have sats of 82% on room air.  Placed on 2 L.  Given 2 DuoNebs, 125 mg of IV Solu-Medrol , 2 g of IV magnesium  with EMS.    Was initially hypotensive with systolic blood pressure of 74.  This has improved.  She denies any known fever.  Rectal temperature of 99.5.  She denies chest pain, vomiting, diarrhea, urinary symptoms.  Patient is drowsy but arousable.  She denies any drug or alcohol  use.  Possible fall at home.  Unclear if she hit her head.  She denies neck or back pain.  No numbness, tingling or weakness.  She denies any changes to any of her hypertensive medications.  Complains of discomfort in her sacral area.  States she has sacral decubitus ulcers.   History provided by patient, EMS.    Past Medical History:  Diagnosis Date   Anxiety    Asthma    Chronic constipation    COPD (chronic obstructive pulmonary disease) (HCC)    Depression    Diabetes mellitus without complication (HCC)    GERD (gastroesophageal reflux disease)    Hypertension    Stroke [facility])     Past Surgical History:  Procedure Laterality Date   ABDOMINAL HYSTERECTOMY     APPENDECTOMY     CHOLECYSTECTOMY     COLONOSCOPY WITH PROPOFOL  N/A 09/04/2014   Procedure: COLONOSCOPY WITH PROPOFOL ;  Surgeon: Cassie Click, MD;  Location: [facility] ENDOSCOPY;  Service: Endoscopy;  Laterality: N/A;   COLONOSCOPY WITH PROPOFOL  N/A 10/21/2017   Procedure: COLONOSCOPY WITH PROPOFOL ;  Surgeon: Cassie Click, MD;  Location: [facility] ENDOSCOPY;  Service: Endoscopy;  Laterality: N/A;   EGD-colonsocopy      ESOPHAGOGASTRODUODENOSCOPY (EGD) WITH PROPOFOL  N/A 09/04/2014   Procedure: ESOPHAGOGASTRODUODENOSCOPY (EGD) WITH PROPOFOL ;  Surgeon: Cassie Click, MD;  Location: [facility] ENDOSCOPY;  Service: Endoscopy;  Laterality: N/A;   SAVORY DILATION N/A 09/04/2014   Procedure: SAVORY DILATION;  Surgeon: Cassie Click, MD;  Location: [facility] ENDOSCOPY;  Service: Endoscopy;  Laterality: N/A;    MEDICATIONS:  Prior to Admission medications   Medication Sig Start Date End Date Taking? Authorizing Provider  albuterol  (PROVENTIL  HFA;VENTOLIN  HFA) 108 (90 BASE) MCG/ACT inhaler Inhale 2 puffs into the lungs every 6 (six) hours as needed for wheezing or shortness of breath. 12/06/14   Ruth Cove, MD  albuterol  (PROVENTIL ) (2.5 MG/3ML) 0.083% nebulizer solution Take 2.5 mg by nebulization every 6 (six) hours as needed.    [provider]  aspirin  EC 81 MG tablet Take 1 tablet (81 mg total) by mouth daily. Swallow whole. 12/05/22   Alexander, Natalie, DO  baclofen  (LIORESAL ) 10 MG tablet Take 10 mg by mouth 3 (three) times daily. 11/08/22   [provider]  busPIRone  (BUSPAR ) 15 MG tablet Take 15 mg by mouth 2 (two) times daily.    [provider]  dextromethorphan -guaiFENesin  (MUCINEX  DM) 30-600 MG 12hr tablet Take 2 tablets by mouth 2 (two) times daily as needed for cough. 12/04/22   Alexander, Natalie, DO  fluticasone  (FLONASE ) 50  MCG/ACT nasal spray Place 1 spray into both nostrils 2 (two) times daily.     [provider]  ibuprofen (ADVIL,MOTRIN) 200 MG tablet Take 800 mg by mouth 3 (three) times daily as needed for moderate pain.    [provider]  lidocaine  (LIDODERM ) 5 % Place 1 patch onto the skin daily as needed. For pain    [provider]  loratadine  (CLARITIN ) 10 MG tablet Take 10 mg by mouth daily.    [provider]  metFORMIN  (GLUCOPHAGE ) 500 MG tablet Take 500 mg by mouth 2 (two) times daily with a meal.    [provider]  nicotine  (NICODERM CQ  - DOSED IN MG/24 HOURS) 21 mg/24hr patch Place 1 patch (21 mg total) onto the skin daily. 12/05/22   Alexander, Natalie, DO  omeprazole (PRILOSEC) 20 MG capsule Take 20 mg by mouth daily.    [provider]  polyethylene glycol (MIRALAX  / GLYCOLAX ) packet Take 17 g by mouth at bedtime.    [provider]  propranolol  (INDERAL ) 20 MG tablet Take 40 mg by mouth 2 (two) times daily.     [provider]  tiotropium (SPIRIVA ) 18 MCG inhalation capsule Place 18 mcg into inhaler and inhale daily.    [provider]  traZODone  (DESYREL ) 100 MG tablet Take 300 mg by mouth at bedtime.     [provider]  venlafaxine  XR (EFFEXOR -XR) 150 MG 24 hr capsule Take 150 mg by mouth daily with breakfast.    [provider]    Physical Exam   Triage Vital Signs: ED Triage Vitals  Encounter Vitals Group     BP 06/19/23 2311 101/79     Systolic BP Percentile --      Diastolic BP Percentile --      Pulse Rate 06/19/23 2311 72     Resp --      Temp 06/19/23 2311 98 F (36.7 C)     Temp Source 06/19/23 2311 Oral     SpO2 06/19/23 2309 94 %     Weight --      Height --      Head Circumference --      Peak Flow --      Pain Score 06/19/23 2312 3     Pain Loc --      Pain Education --      Exclude from Growth Chart --     Most recent vital signs: Vitals:   06/20/23 0530 06/20/23 0700  BP: (!) 103/59 (!) 94/56  Pulse: 68 70  Resp: 19 (!) 21  Temp:    SpO2: 100% 99%    CONSTITUTIONAL: Alert, responds appropriately to questions.  Drowsy but arousable.  Oriented x 3. HEAD: Normocephalic, atraumatic EYES: Conjunctivae clear, pupils appear equal, sclera nonicteric ENT: normal nose; moist mucous membranes NECK: Supple, normal ROM, no midline spinal tenderness or step-off or deformity, no meningismus CARD: RRR; S1 and S2 appreciated RESP: Mild tachypnea.  Diffuse inspiratory expiratory wheezes.  Diminished aeration at  bases.  No rhonchi or rales.  Sats 95% on 2 L nasal cannula.  No distress. ABD/GI: Non-distended; soft, non-tender, no rebound, no guarding, no peritoneal signs BACK: patient has stage II sacral decubitus ulcers with surrounding increased redness, warmth.  No drainage, bleeding. EXT: Normal ROM in all joints; no deformity noted, no edema SKIN: Normal color for age and race; warm; no rash on exposed skin NEURO: Moves all extremities equally, normal speech, no facial asymmetry, reports  normal sensation diffusely PSYCH: The patient's mood and manner are appropriate.      SACRUM  Patient gave verbal permission to utilize photo for medical documentation only. The image was not stored on any personal device.  ED Results / Procedures / Treatments   LABS: (all labs ordered are listed, but only abnormal results are displayed) Labs Reviewed  CBC WITH DIFFERENTIAL/PLATELET - Abnormal; Notable for the following components:      Result Value   WBC 17.4 (*)    Hemoglobin 11.1 (*)    Neutro Abs 13.9 (*)    Monocytes Absolute 1.3 (*)    Abs Immature Granulocytes 0.23 (*)    All other components within normal limits  COMPREHENSIVE METABOLIC PANEL WITH GFR - Abnormal; Notable for the following components:   Potassium 3.3 (*)    Chloride 85 (*)    CO2 38 (*)    Glucose, Bld 148 (*)    BUN 28 (*)    Creatinine, Ser 1.18 (*)    Calcium 8.8 (*)    Albumin 3.0 (*)    GFR, Estimated 51 (*)    All other components within normal limits  BLOOD GAS, VENOUS - Abnormal; Notable for the following components:   pCO2, Ven 85 (*)    Bicarbonate 52.7 (*)    Acid-Base Excess 22.6 (*)    All other components within normal limits  LACTIC ACID, PLASMA - Abnormal; Notable for the following components:   Lactic Acid, Venous 2.2 (*)    All other components within normal limits  URINALYSIS, W/ REFLEX TO CULTURE (INFECTION SUSPECTED) - Abnormal; Notable for the following components:   Color, Urine YELLOW (*)     APPearance CLEAR (*)    Hgb urine dipstick SMALL (*)    Ketones, ur 5 (*)    Protein, ur 30 (*)    All other components within normal limits  URINE DRUG SCREEN, QUALITATIVE ([facility] ONLY) - Abnormal; Notable for the following components:   Benzodiazepine, Ur Scrn POSITIVE (*)    All other components within normal limits  CBG MONITORING, ED - Abnormal; Notable for the following components:   Glucose-Capillary 150 (*)    All other components within normal limits  TROPONIN I (HIGH SENSITIVITY) - Abnormal; Notable for the following components:   Troponin I (High Sensitivity) 19 (*)    All other components within normal limits  RESP PANEL BY RT-PCR (RSV, FLU A&B, COVID)  RVPGX2  CULTURE, BLOOD (ROUTINE X 2)  CULTURE, BLOOD (ROUTINE X 2)  BRAIN NATRIURETIC PEPTIDE  LACTIC ACID, PLASMA  PROTIME-INR  ETHANOL  CORTISOL-AM, BLOOD  TROPONIN I (HIGH SENSITIVITY)     EKG:  EKG Interpretation Date/Time:  Sunday June 19 2023 23:07:46 EDT Ventricular Rate:  72 PR Interval:  112 QRS Duration:  86 QT Interval:  416 QTC Calculation: 456 R Axis:   60  Text Interpretation: Sinus rhythm Borderline short PR interval Confirmed by Verneda Golder [id number]) on 06/19/2023 11:33:53 PM         RADIOLOGY: My personal review and interpretation of imaging: CTs show no bronchitis.  No pneumonia.  I have personally reviewed all radiology reports.   CT HEAD WO CONTRAST ( ) Result Date: 06/20/2023 CLINICAL DATA:  Neck trauma, mental status change, fall, hypotension. Patient does not remember falling and is unsure if passed out. History of COPD wearing 2 L of oxygen at baseline. 82% on room air. Sepsis. EXAM: CT HEAD WITHOUT CONTRAST CT CERVICAL SPINE WITHOUT CONTRAST CT CHEST, ABDOMEN AND  PELVIS WITHOUT CONTRAST TECHNIQUE: Contiguous axial images were obtained from the base of the skull through the vertex without intravenous contrast. Multidetector CT imaging of the cervical spine was performed without  intravenous contrast. Multiplanar CT image reconstructions were also generated. Multidetector CT imaging of the chest, abdomen and pelvis was performed following the standard protocol without IV contrast. RADIATION DOSE REDUCTION: This exam was performed according to the departmental dose-optimization program which includes automated exposure control, adjustment of the mA and/or kV according to patient size and/or use of iterative reconstruction technique. COMPARISON:  Chest radiograph 06/19/2023; CTA chest 11/28/2022; CT abdomen pelvis 04/13/2016; CT head 09/26/2006 FINDINGS: CT HEAD FINDINGS Brain: No evidence of acute infarction, hemorrhage, hydrocephalus, extra-axial collection or mass lesion/mass effect. Vascular: No hyperdense vessel or unexpected calcification. Skull: Normal. Negative for fracture or focal lesion. Sinuses/Orbits: No acute finding. Other: None. CT CERVICAL FINDINGS Alignment: No evidence of traumatic listhesis. Skull base and vertebrae: No acute fracture. Soft tissues and spinal canal: No prevertebral fluid or swelling. No visible canal hematoma. Disc levels: Multilevel spondylosis, disc space height loss, and degenerative endplate changes greatest at C5-C6 and C6-C7 where it is moderate. Posterior disc osteophyte complex at C5-C6 and C6-C7 cause is moderate effacement of the thecal sac. Uncovertebral spurring and facet arthropathy causes multilevel neural foraminal narrowing greatest on the left at C5-C6 and bilaterally at C6-C7. Other: None. CT CHEST FINDINGS Cardiovascular: Normal heart size. No pericardial effusion. Normal caliber thoracic aorta. Mediastinum/Nodes: Trachea and esophagus are unremarkable. Right hilar lymphadenopathy. For example 1.3 cm lymph node on series 2/image 29 additional mediastinal lymphadenopathy with a 1.2 cm left paratracheal node (series 2/image 25). Left suprahilar lymphadenopathy measuring 11 mm (series 2/image 24). These are enlarged compared to 11/28/2022.  Lungs/Pleura: Chronic bronchial wall thickening and bronchiolectasis. Diffuse bilateral centrilobular micro nodules and tree-in-bud opacities are progressed compared to prior. No focal pneumonia. Centrilobular and paraseptal emphysema in the upper lobes. No pleural effusion or pneumothorax. Musculoskeletal: No acute fracture. CT ABDOMEN AND PELVIS FINDINGS Hepatobiliary: Hepatic steatosis. Cholecystectomy. No acute abnormality. Pancreas: Unremarkable. Spleen: Unremarkable. Adrenals/Urinary Tract: Chronic thickening of the left adrenal gland. Unremarkable right adrenal gland. No urinary calculi or hydronephrosis. Unremarkable bladder Stomach/Bowel: Normal caliber large and small bowel. Moderate stool in the right colon. No bowel wall thickening. The appendix is not visualized. Stomach is within normal limits. Duodenal diverticulum. Vascular/Lymphatic: Aortic atherosclerosis. No enlarged abdominal or pelvic lymph nodes. Reproductive: Hysterectomy.  No adnexal mass. Other: No free intraperitoneal fluid or air. Musculoskeletal: No acute fracture. IMPRESSION: 1. No acute intracranial abnormality. 2. No acute fracture or traumatic listhesis of the cervical spine. 3. No acute traumatic abnormality in the chest, abdomen, or pelvis. 4. Progressive bronchitis/bronchiolitis compared to 11/28/2022. 5. Mediastinal and bilateral hilar lymphadenopathy, likely reactive. Continued attention on follow-up. 6. Hepatic steatosis. Aortic Atherosclerosis (ICD10-I70.0) and Emphysema (ICD10-J43.9). Electronically Signed   By: Rozell Cornet M.D.   On: 06/20/2023 00:38   CT CHEST ABDOMEN PELVIS W CONTRAST Result Date: 06/20/2023 CLINICAL DATA:  Neck trauma, mental status change, fall, hypotension. Patient does not remember falling and is unsure if passed out. History of COPD wearing 2 L of oxygen at baseline. 82% on room air. Sepsis. EXAM: CT HEAD WITHOUT CONTRAST CT CERVICAL SPINE WITHOUT CONTRAST CT CHEST, ABDOMEN AND PELVIS WITHOUT  CONTRAST TECHNIQUE: Contiguous axial images were obtained from the base of the skull through the vertex without intravenous contrast. Multidetector CT imaging of the cervical spine was performed without intravenous contrast. Multiplanar CT image reconstructions were also  generated. Multidetector CT imaging of the chest, abdomen and pelvis was performed following the standard protocol without IV contrast. RADIATION DOSE REDUCTION: This exam was performed according to the departmental dose-optimization program which includes automated exposure control, adjustment of the mA and/or kV according to patient size and/or use of iterative reconstruction technique. COMPARISON:  Chest radiograph 06/19/2023; CTA chest 11/28/2022; CT abdomen pelvis 04/13/2016; CT head 09/26/2006 FINDINGS: CT HEAD FINDINGS Brain: No evidence of acute infarction, hemorrhage, hydrocephalus, extra-axial collection or mass lesion/mass effect. Vascular: No hyperdense vessel or unexpected calcification. Skull: Normal. Negative for fracture or focal lesion. Sinuses/Orbits: No acute finding. Other: None. CT CERVICAL FINDINGS Alignment: No evidence of traumatic listhesis. Skull base and vertebrae: No acute fracture. Soft tissues and spinal canal: No prevertebral fluid or swelling. No visible canal hematoma. Disc levels: Multilevel spondylosis, disc space height loss, and degenerative endplate changes greatest at C5-C6 and C6-C7 where it is moderate. Posterior disc osteophyte complex at C5-C6 and C6-C7 cause is moderate effacement of the thecal sac. Uncovertebral spurring and facet arthropathy causes multilevel neural foraminal narrowing greatest on the left at C5-C6 and bilaterally at C6-C7. Other: None. CT CHEST FINDINGS Cardiovascular: Normal heart size. No pericardial effusion. Normal caliber thoracic aorta. Mediastinum/Nodes: Trachea and esophagus are unremarkable. Right hilar lymphadenopathy. For example 1.3 cm lymph node on series 2/image 29  additional mediastinal lymphadenopathy with a 1.2 cm left paratracheal node (series 2/image 25). Left suprahilar lymphadenopathy measuring 11 mm (series 2/image 24). These are enlarged compared to 11/28/2022. Lungs/Pleura: Chronic bronchial wall thickening and bronchiolectasis. Diffuse bilateral centrilobular micro nodules and tree-in-bud opacities are progressed compared to prior. No focal pneumonia. Centrilobular and paraseptal emphysema in the upper lobes. No pleural effusion or pneumothorax. Musculoskeletal: No acute fracture. CT ABDOMEN AND PELVIS FINDINGS Hepatobiliary: Hepatic steatosis. Cholecystectomy. No acute abnormality. Pancreas: Unremarkable. Spleen: Unremarkable. Adrenals/Urinary Tract: Chronic thickening of the left adrenal gland. Unremarkable right adrenal gland. No urinary calculi or hydronephrosis. Unremarkable bladder Stomach/Bowel: Normal caliber large and small bowel. Moderate stool in the right colon. No bowel wall thickening. The appendix is not visualized. Stomach is within normal limits. Duodenal diverticulum. Vascular/Lymphatic: Aortic atherosclerosis. No enlarged abdominal or pelvic lymph nodes. Reproductive: Hysterectomy.  No adnexal mass. Other: No free intraperitoneal fluid or air. Musculoskeletal: No acute fracture. IMPRESSION: 1. No acute intracranial abnormality. 2. No acute fracture or traumatic listhesis of the cervical spine. 3. No acute traumatic abnormality in the chest, abdomen, or pelvis. 4. Progressive bronchitis/bronchiolitis compared to 11/28/2022. 5. Mediastinal and bilateral hilar lymphadenopathy, likely reactive. Continued attention on follow-up. 6. Hepatic steatosis. Aortic Atherosclerosis (ICD10-I70.0) and Emphysema (ICD10-J43.9). Electronically Signed   By: Rozell Cornet M.D.   On: 06/20/2023 00:38   CT Cervical Spine Wo Contrast Result Date: 06/20/2023 CLINICAL DATA:  Neck trauma, mental status change, fall, hypotension. Patient does not remember falling and is  unsure if passed out. History of COPD wearing 2 L of oxygen at baseline. 82% on room air. Sepsis. EXAM: CT HEAD WITHOUT CONTRAST CT CERVICAL SPINE WITHOUT CONTRAST CT CHEST, ABDOMEN AND PELVIS WITHOUT CONTRAST TECHNIQUE: Contiguous axial images were obtained from the base of the skull through the vertex without intravenous contrast. Multidetector CT imaging of the cervical spine was performed without intravenous contrast. Multiplanar CT image reconstructions were also generated. Multidetector CT imaging of the chest, abdomen and pelvis was performed following the standard protocol without IV contrast. RADIATION DOSE REDUCTION: This exam was performed according to the departmental dose-optimization program which includes automated exposure control, adjustment of the mA  and/or kV according to patient size and/or use of iterative reconstruction technique. COMPARISON:  Chest radiograph 06/19/2023; CTA chest 11/28/2022; CT abdomen pelvis 04/13/2016; CT head 09/26/2006 FINDINGS: CT HEAD FINDINGS Brain: No evidence of acute infarction, hemorrhage, hydrocephalus, extra-axial collection or mass lesion/mass effect. Vascular: No hyperdense vessel or unexpected calcification. Skull: Normal. Negative for fracture or focal lesion. Sinuses/Orbits: No acute finding. Other: None. CT CERVICAL FINDINGS Alignment: No evidence of traumatic listhesis. Skull base and vertebrae: No acute fracture. Soft tissues and spinal canal: No prevertebral fluid or swelling. No visible canal hematoma. Disc levels: Multilevel spondylosis, disc space height loss, and degenerative endplate changes greatest at C5-C6 and C6-C7 where it is moderate. Posterior disc osteophyte complex at C5-C6 and C6-C7 cause is moderate effacement of the thecal sac. Uncovertebral spurring and facet arthropathy causes multilevel neural foraminal narrowing greatest on the left at C5-C6 and bilaterally at C6-C7. Other: None. CT CHEST FINDINGS Cardiovascular: Normal heart size.  No pericardial effusion. Normal caliber thoracic aorta. Mediastinum/Nodes: Trachea and esophagus are unremarkable. Right hilar lymphadenopathy. For example 1.3 cm lymph node on series 2/image 29 additional mediastinal lymphadenopathy with a 1.2 cm left paratracheal node (series 2/image 25). Left suprahilar lymphadenopathy measuring 11 mm (series 2/image 24). These are enlarged compared to 11/28/2022. Lungs/Pleura: Chronic bronchial wall thickening and bronchiolectasis. Diffuse bilateral centrilobular micro nodules and tree-in-bud opacities are progressed compared to prior. No focal pneumonia. Centrilobular and paraseptal emphysema in the upper lobes. No pleural effusion or pneumothorax. Musculoskeletal: No acute fracture. CT ABDOMEN AND PELVIS FINDINGS Hepatobiliary: Hepatic steatosis. Cholecystectomy. No acute abnormality. Pancreas: Unremarkable. Spleen: Unremarkable. Adrenals/Urinary Tract: Chronic thickening of the left adrenal gland. Unremarkable right adrenal gland. No urinary calculi or hydronephrosis. Unremarkable bladder Stomach/Bowel: Normal caliber large and small bowel. Moderate stool in the right colon. No bowel wall thickening. The appendix is not visualized. Stomach is within normal limits. Duodenal diverticulum. Vascular/Lymphatic: Aortic atherosclerosis. No enlarged abdominal or pelvic lymph nodes. Reproductive: Hysterectomy.  No adnexal mass. Other: No free intraperitoneal fluid or air. Musculoskeletal: No acute fracture. IMPRESSION: 1. No acute intracranial abnormality. 2. No acute fracture or traumatic listhesis of the cervical spine. 3. No acute traumatic abnormality in the chest, abdomen, or pelvis. 4. Progressive bronchitis/bronchiolitis compared to 11/28/2022. 5. Mediastinal and bilateral hilar lymphadenopathy, likely reactive. Continued attention on follow-up. 6. Hepatic steatosis. Aortic Atherosclerosis (ICD10-I70.0) and Emphysema (ICD10-J43.9). Electronically Signed   By: Rozell Cornet  M.D.   On: 06/20/2023 00:38   DG Chest Portable 1 View Result Date: 06/19/2023 CLINICAL DATA:  Shortness of breath EXAM: PORTABLE CHEST 1 VIEW COMPARISON:  11/28/2022 FINDINGS: Cardiac shadow is stable. The lungs are well aerated bilaterally. Mild central vascular congestion is noted without significant edema. No bony abnormality is noted. IMPRESSION: Mild vascular congestion without significant edema. Electronically Signed   By: Violeta Grey M.D.   On: 06/19/2023 23:09     PROCEDURES:  Critical Care performed: Yes, see critical care procedure note(s)   CRITICAL CARE Performed by: Starling Eck Shavonda Wiedman   Total critical care time: 40 minutes  Critical care time was exclusive of separately billable procedures and treating other patients.  Critical care was necessary to treat or prevent imminent or life-threatening deterioration.  Critical care was time spent personally by me on the following activities: development of treatment plan with patient and/or surrogate as well as nursing, discussions with consultants, evaluation of patient's response to treatment, examination of patient, obtaining history from patient or surrogate, ordering and performing treatments and interventions, ordering and review of  laboratory studies, ordering and review of radiographic studies, pulse oximetry and re-evaluation of patient's condition.   Aaron Aas1-3 Lead EKG Interpretation  Performed by: Elliett Guarisco, Clover Dao, DO Authorized by: Casmer Yepiz, Clover Dao, DO     Interpretation: normal     ECG rate:  70   ECG rate assessment: normal     Rhythm: sinus rhythm     Ectopy: none     Conduction: normal       IMPRESSION / MDM / ASSESSMENT AND PLAN / ED COURSE  I reviewed the triage vital signs and the nursing notes.    Patient here with reports of fall, hypotension, hypoxia, shortness of breath and wheezing.  The patient is on the cardiac monitor to evaluate for evidence of arrhythmia and/or significant heart rate  changes.   DIFFERENTIAL DIAGNOSIS (includes but not limited to):   COPD exacerbation, CHF exacerbation, pneumonia, viral URI, pneumothorax, PE, sepsis, sacral cellulitis, UTI, dehydration, anemia, intracranial hemorrhage, hypercarbia   Patient's presentation is most consistent with acute presentation with potential threat to life or bodily function.   PLAN: Will obtain labs, urine, cultures, chest x-ray, CT of the head and cervical spine given fall with drowsiness, CT of the chest, abdomen pelvis to evaluate for sources of sepsis.  She does appear to have sacral cellulitis on exam without signs of crepitus, abscess.  Will give broad-spectrum antibiotics here.  Will continue breathing treatments.  Will give 30 mL/kg IV fluid bolus.   MEDICATIONS GIVEN IN ED: Medications  aspirin  EC tablet 81 mg (has no administration in time range)  lactated ringers infusion (150 mL/hr Intravenous New Bag/Given 06/20/23 0348)  enoxaparin  (LOVENOX ) injection 40 mg (has no administration in time range)  metroNIDAZOLE (FLAGYL) IVPB 500 mg (500 mg Intravenous Not Given 06/20/23 0348)  acetaminophen  (TYLENOL ) tablet 650 mg (has no administration in time range)    Or  acetaminophen  (TYLENOL ) suppository 650 mg (has no administration in time range)  ondansetron  (ZOFRAN ) tablet 4 mg (has no administration in time range)    Or  ondansetron  (ZOFRAN ) injection 4 mg (has no administration in time range)  vancomycin (VANCOREADY) IVPB 1250 mg/250 mL (has no administration in time range)  ceFEPIme (MAXIPIME) 2 g in sodium chloride  0.9 % 100 mL IVPB (has no administration in time range)  sodium chloride  0.9 % bolus 1,000 mL (0 mLs Intravenous Stopped 06/20/23 0111)  ipratropium-albuterol  (DUONEB) 0.5-2.5 (3) MG/3ML nebulizer solution 3 mL (3 mLs Nebulization Given 06/19/23 2331)  ipratropium-albuterol  (DUONEB) 0.5-2.5 (3) MG/3ML nebulizer solution 3 mL (3 mLs Nebulization Given 06/19/23 2331)  lactated ringers bolus 1,000 mL (0  mLs Intravenous Stopped 06/20/23 0111)  ceFEPIme (MAXIPIME) 2 g in sodium chloride  0.9 % 100 mL IVPB (0 g Intravenous Stopped 06/20/23 0111)  vancomycin (VANCOCIN) IVPB 1000 mg/200 mL premix (0 mg Intravenous Stopped 06/20/23 0326)  iohexol  (OMNIPAQUE ) 300 MG/ML solution 100 mL (100 mLs Intravenous Contrast Given 06/20/23 0005)  lactated ringers bolus 1,000 mL (0 mLs Intravenous Stopped 06/20/23 0701)     ED COURSE: Patient's labs show leukocytosis of 17,000.  Lactic of 2.2.  COVID, flu and RSV negative.  CT scans reviewed and interpreted by myself and radiologist and showed no acute traumatic injury.  She does have progressive bronchitis/bronchiolitis on CT imaging with mediastinal and hilar lymphadenopathy likely reactive.  Vital signs have improved here.  Still drowsy but able to answer questions appropriately, neurologically intact otherwise.  VBG shows elevated pCO2 but also elevated bicarb and normal pH.  Likely compensated but  given concerns for drowsiness, will start her on BiPAP to see if this helps improve her mental status at all.  Will discuss with the hospitalist for admission.   CONSULTS:  Consulted and discussed patient's case with hospitalist, Dr. Vallarie Gauze.  I have recommended admission and consulting physician agrees and will place admission orders.  Patient (and family if present) agree with this plan.   I reviewed all nursing notes, vitals, pertinent previous records.  All labs, EKGs, imaging ordered have been independently reviewed and interpreted by myself.    OUTSIDE RECORDS REVIEWED: Reviewed last admission in November 2024.       FINAL CLINICAL IMPRESSION(S) / ED DIAGNOSES   Final diagnoses:  COPD with acute exacerbation (HCC)  Acute sepsis (HCC)  Acute on chronic respiratory failure with hypoxia (HCC)  Cellulitis of sacral region     Rx / DC Orders   ED Discharge Orders     None        Note:  This document was prepared using Dragon voice recognition software  and may include unintentional dictation errors.   Onaje Warne, Clover Dao, DO 06/20/23 [phone]

## 2023-06-20 ENCOUNTER — Other Ambulatory Visit: Payer: Self-pay

## 2023-06-20 ENCOUNTER — Encounter: Payer: Self-pay | Admitting: Internal Medicine

## 2023-06-20 DIAGNOSIS — Y92009 Unspecified place in unspecified non-institutional (private) residence as the place of occurrence of the external cause: Secondary | ICD-10-CM | POA: Diagnosis not present

## 2023-06-20 DIAGNOSIS — J219 Acute bronchiolitis, unspecified: Secondary | ICD-10-CM | POA: Diagnosis present

## 2023-06-20 DIAGNOSIS — F17210 Nicotine dependence, cigarettes, uncomplicated: Secondary | ICD-10-CM | POA: Diagnosis present

## 2023-06-20 DIAGNOSIS — J441 Chronic obstructive pulmonary disease with (acute) exacerbation: Secondary | ICD-10-CM | POA: Diagnosis present

## 2023-06-20 DIAGNOSIS — J9621 Acute and chronic respiratory failure with hypoxia: Secondary | ICD-10-CM | POA: Diagnosis present

## 2023-06-20 DIAGNOSIS — J9622 Acute and chronic respiratory failure with hypercapnia: Secondary | ICD-10-CM

## 2023-06-20 DIAGNOSIS — I10 Essential (primary) hypertension: Secondary | ICD-10-CM | POA: Diagnosis present

## 2023-06-20 DIAGNOSIS — Z9981 Dependence on supplemental oxygen: Secondary | ICD-10-CM | POA: Diagnosis not present

## 2023-06-20 DIAGNOSIS — F32A Depression, unspecified: Secondary | ICD-10-CM | POA: Diagnosis present

## 2023-06-20 DIAGNOSIS — W19XXXA Unspecified fall, initial encounter: Secondary | ICD-10-CM | POA: Diagnosis present

## 2023-06-20 DIAGNOSIS — I959 Hypotension, unspecified: Secondary | ICD-10-CM | POA: Diagnosis present

## 2023-06-20 DIAGNOSIS — A419 Sepsis, unspecified organism: Secondary | ICD-10-CM | POA: Diagnosis not present

## 2023-06-20 DIAGNOSIS — J440 Chronic obstructive pulmonary disease with acute lower respiratory infection: Secondary | ICD-10-CM | POA: Diagnosis present

## 2023-06-20 DIAGNOSIS — E785 Hyperlipidemia, unspecified: Secondary | ICD-10-CM | POA: Diagnosis present

## 2023-06-20 DIAGNOSIS — L03319 Cellulitis of trunk, unspecified: Secondary | ICD-10-CM

## 2023-06-20 DIAGNOSIS — F419 Anxiety disorder, unspecified: Secondary | ICD-10-CM | POA: Diagnosis present

## 2023-06-20 DIAGNOSIS — L89152 Pressure ulcer of sacral region, stage 2: Secondary | ICD-10-CM | POA: Diagnosis present

## 2023-06-20 DIAGNOSIS — E872 Acidosis, unspecified: Secondary | ICD-10-CM

## 2023-06-20 DIAGNOSIS — N179 Acute kidney failure, unspecified: Secondary | ICD-10-CM

## 2023-06-20 DIAGNOSIS — E119 Type 2 diabetes mellitus without complications: Secondary | ICD-10-CM | POA: Diagnosis present

## 2023-06-20 DIAGNOSIS — G9341 Metabolic encephalopathy: Secondary | ICD-10-CM

## 2023-06-20 DIAGNOSIS — Z7982 Long term (current) use of aspirin: Secondary | ICD-10-CM | POA: Diagnosis not present

## 2023-06-20 DIAGNOSIS — Z1152 Encounter for screening for COVID-19: Secondary | ICD-10-CM | POA: Diagnosis not present

## 2023-06-20 DIAGNOSIS — Z66 Do not resuscitate: Secondary | ICD-10-CM | POA: Diagnosis present

## 2023-06-20 DIAGNOSIS — E876 Hypokalemia: Secondary | ICD-10-CM | POA: Diagnosis present

## 2023-06-20 DIAGNOSIS — E8809 Other disorders of plasma-protein metabolism, not elsewhere classified: Secondary | ICD-10-CM | POA: Diagnosis present

## 2023-06-20 LAB — PROTIME-INR
INR: 1.1 (ref 0.8–1.2)
Prothrombin Time: 14.3 s (ref 11.4–15.2)

## 2023-06-20 LAB — BASIC METABOLIC PANEL WITH GFR
Anion gap: 8 (ref 5–15)
BUN: 21 mg/dL (ref 8–23)
CO2: 40 mmol/L — ABNORMAL HIGH (ref 22–32)
Calcium: 8.5 mg/dL — ABNORMAL LOW (ref 8.9–10.3)
Chloride: 92 mmol/L — ABNORMAL LOW (ref 98–111)
Creatinine, Ser: 0.93 mg/dL (ref 0.44–1.00)
GFR, Estimated: >60 mL/min (ref >60)
Glucose, Bld: 175 mg/dL — ABNORMAL HIGH (ref 70–99)
Potassium: 4 mmol/L (ref 3.5–5.1)
Sodium: 140 mmol/L (ref 135–145)

## 2023-06-20 LAB — URINALYSIS, W/ REFLEX TO CULTURE (INFECTION SUSPECTED)
Bacteria, UA: NONE SEEN
Bilirubin Urine: NEGATIVE
Glucose, UA: NEGATIVE mg/dL
Ketones, ur: 5 mg/dL — AB
Leukocytes,Ua: NEGATIVE
Nitrite: NEGATIVE
Protein, ur: 30 mg/dL — AB
Specific Gravity, Urine: 1.023 (ref 1.005–1.030)
pH: 6 (ref 5.0–8.0)

## 2023-06-20 LAB — MAGNESIUM: Magnesium: 1.7 mg/dL (ref 1.7–2.4)

## 2023-06-20 LAB — RESP PANEL BY RT-PCR (RSV, FLU A&B, COVID)  RVPGX2
Influenza A by PCR: NEGATIVE
Influenza B by PCR: NEGATIVE
Resp Syncytial Virus by PCR: NEGATIVE
SARS Coronavirus 2 by RT PCR: NEGATIVE

## 2023-06-20 LAB — CORTISOL-AM, BLOOD: Cortisol - AM: 22.9 ug/dL — ABNORMAL HIGH (ref 6.7–22.6)

## 2023-06-20 LAB — TROPONIN I (HIGH SENSITIVITY)
Troponin I (High Sensitivity): 19 ng/L — ABNORMAL HIGH (ref <18)
Troponin I (High Sensitivity): 17 ng/L (ref <18)

## 2023-06-20 LAB — URINE DRUG SCREEN, QUALITATIVE (ARMC ONLY)
Amphetamines, Ur Screen: NOT DETECTED
Barbiturates, Ur Screen: NOT DETECTED
Benzodiazepine, Ur Scrn: POSITIVE — AB
Cannabinoid 50 Ng, Ur ~~LOC~~: NOT DETECTED
Cocaine Metabolite,Ur ~~LOC~~: NOT DETECTED
MDMA (Ecstasy)Ur Screen: NOT DETECTED
Methadone Scn, Ur: NOT DETECTED
Opiate, Ur Screen: NOT DETECTED
Phencyclidine (PCP) Ur S: NOT DETECTED
Tricyclic, Ur Screen: NOT DETECTED

## 2023-06-20 LAB — ETHANOL: Alcohol, Ethyl (B): <15 mg/dL (ref <15)

## 2023-06-20 LAB — LACTIC ACID, PLASMA: Lactic Acid, Venous: 1.1 mmol/L (ref 0.5–1.9)

## 2023-06-20 LAB — BRAIN NATRIURETIC PEPTIDE: B Natriuretic Peptide: 51.8 pg/mL (ref 0.0–100.0)

## 2023-06-20 LAB — PHOSPHORUS: Phosphorus: 3.8 mg/dL (ref 2.5–4.6)

## 2023-06-20 MED ORDER — LACTATED RINGERS IV SOLN
150.0000 mL/h | INTRAVENOUS | Status: AC
  Administered 2023-06-20 (×2): 150 mL/h via INTRAVENOUS

## 2023-06-20 MED ORDER — ACETAMINOPHEN 650 MG RE SUPP: 650.0000 mg | Freq: Four times a day (QID) | RECTAL | Status: DC | PRN

## 2023-06-20 MED ORDER — FLUTICASONE FUROATE-VILANTEROL 200-25 MCG/ACT IN AEPB
1.0000 | INHALATION_SPRAY | Freq: Every day | RESPIRATORY_TRACT | Status: DC
  Administered 2023-06-20 – 2023-06-24 (×5): 1 via RESPIRATORY_TRACT
  Filled 2023-06-20: qty 28

## 2023-06-20 MED ORDER — ALBUTEROL SULFATE HFA 108 (90 BASE) MCG/ACT IN AERS
2.0000 | INHALATION_SPRAY | Freq: Four times a day (QID) | RESPIRATORY_TRACT | Status: DC | PRN
  Administered 2023-06-21: 2 via RESPIRATORY_TRACT
  Filled 2023-06-20: qty 6.7

## 2023-06-20 MED ORDER — ALBUTEROL SULFATE (2.5 MG/3ML) 0.083% IN NEBU
2.5000 mg | INHALATION_SOLUTION | Freq: Four times a day (QID) | RESPIRATORY_TRACT | Status: DC
  Administered 2023-06-20 – 2023-06-21 (×3): 2.5 mg via RESPIRATORY_TRACT
  Filled 2023-06-20 (×3): qty 3

## 2023-06-20 MED ORDER — MAGNESIUM SULFATE 2 GM/50ML IV SOLN
2.0000 g | Freq: Once | INTRAVENOUS | Status: AC
  Administered 2023-06-20: 2 g via INTRAVENOUS
  Filled 2023-06-20: qty 50

## 2023-06-20 MED ORDER — ASPIRIN 81 MG PO TBEC
81.0000 mg | DELAYED_RELEASE_TABLET | Freq: Every day | ORAL | Status: DC
  Administered 2023-06-20 – 2023-06-24 (×5): 81 mg via ORAL
  Filled 2023-06-20 (×5): qty 1

## 2023-06-20 MED ORDER — VENLAFAXINE HCL ER 150 MG PO CP24
150.0000 mg | ORAL_CAPSULE | Freq: Every day | ORAL | Status: DC
  Administered 2023-06-20 – 2023-06-24 (×5): 150 mg via ORAL
  Filled 2023-06-20 (×2): qty 1
  Filled 2023-06-20 (×3): qty 2
  Filled 2023-06-20 (×3): qty 1
  Filled 2023-06-20: qty 2

## 2023-06-20 MED ORDER — ENSURE PLUS HIGH PROTEIN PO LIQD
237.0000 mL | Freq: Two times a day (BID) | ORAL | Status: DC
  Administered 2023-06-20 – 2023-06-24 (×8): 237 mL via ORAL

## 2023-06-20 MED ORDER — METRONIDAZOLE 500 MG/100ML IV SOLN
500.0000 mg | Freq: Two times a day (BID) | INTRAVENOUS | Status: DC
  Administered 2023-06-20 – 2023-06-21 (×3): 500 mg via INTRAVENOUS
  Filled 2023-06-20 (×3): qty 100

## 2023-06-20 MED ORDER — UMECLIDINIUM BROMIDE 62.5 MCG/ACT IN AEPB
1.0000 | INHALATION_SPRAY | Freq: Every day | RESPIRATORY_TRACT | Status: DC
  Administered 2023-06-20 – 2023-06-24 (×5): 1 via RESPIRATORY_TRACT
  Filled 2023-06-20: qty 7

## 2023-06-20 MED ORDER — SODIUM CHLORIDE 0.9 % IV SOLN
2.0000 g | Freq: Two times a day (BID) | INTRAVENOUS | Status: DC
  Administered 2023-06-20 – 2023-06-21 (×3): 2 g via INTRAVENOUS
  Filled 2023-06-20 (×4): qty 12.5

## 2023-06-20 MED ORDER — ONDANSETRON HCL 4 MG/2ML IJ SOLN: 4.0000 mg | Freq: Four times a day (QID) | INTRAMUSCULAR | Status: DC | PRN

## 2023-06-20 MED ORDER — ACETAMINOPHEN 325 MG PO TABS
650.0000 mg | ORAL_TABLET | Freq: Four times a day (QID) | ORAL | Status: DC | PRN
  Administered 2023-06-23 – 2023-06-24 (×2): 650 mg via ORAL
  Filled 2023-06-20 (×2): qty 2

## 2023-06-20 MED ORDER — ENOXAPARIN SODIUM 40 MG/0.4ML IJ SOSY
40.0000 mg | PREFILLED_SYRINGE | INTRAMUSCULAR | Status: DC
  Administered 2023-06-20 – 2023-06-24 (×5): 40 mg via SUBCUTANEOUS
  Filled 2023-06-20 (×5): qty 0.4

## 2023-06-20 MED ORDER — LACTATED RINGERS IV BOLUS
1000.0000 mL | Freq: Once | INTRAVENOUS | Status: AC
  Administered 2023-06-20: 1000 mL via INTRAVENOUS

## 2023-06-20 MED ORDER — VANCOMYCIN HCL 1250 MG/250ML IV SOLN
1250.0000 mg | INTRAVENOUS | Status: DC
  Filled 2023-06-20: qty 250

## 2023-06-20 MED ORDER — MELATONIN 5 MG PO TABS
5.0000 mg | ORAL_TABLET | Freq: Every evening | ORAL | Status: DC | PRN
  Administered 2023-06-21 – 2023-06-22 (×2): 5 mg via ORAL
  Filled 2023-06-20 (×2): qty 1

## 2023-06-20 MED ORDER — FLUTICASONE PROPIONATE 50 MCG/ACT NA SUSP
1.0000 | Freq: Two times a day (BID) | NASAL | Status: DC
  Administered 2023-06-20 – 2023-06-24 (×8): 1 via NASAL
  Filled 2023-06-20: qty 16

## 2023-06-20 MED ORDER — ALBUTEROL SULFATE (2.5 MG/3ML) 0.083% IN NEBU
2.5000 mg | INHALATION_SOLUTION | Freq: Four times a day (QID) | RESPIRATORY_TRACT | Status: DC | PRN
  Administered 2023-06-20: 2.5 mg via RESPIRATORY_TRACT
  Filled 2023-06-20: qty 3

## 2023-06-20 MED ORDER — ONDANSETRON HCL 4 MG PO TABS
4.0000 mg | ORAL_TABLET | Freq: Four times a day (QID) | ORAL | Status: AC | PRN
Start: 2023-06-20 — End: ?

## 2023-06-20 NOTE — Consult Note (Signed)
 PHARMACY CONSULT NOTE - ELECTROLYTES  Pharmacy Consult for Electrolyte Monitoring and Replacement   Recent Labs: Height: 5\' 1"  (154.9 cm) Weight: 56.6 kg (124 lb 12.5 oz) IBW/kg (Calculated) : 47.8 Estimated Creatinine Clearance: 34.9 mL/min (A) (by C-G formula based on SCr of 1.18 mg/dL (H)).  Potassium (mmol/L)  Date Value  06/20/2023 4.0  10/18/2013 3.6   Magnesium  (mg/dL)  Date Value  16/10/9602 1.7  09/05/2013 1.9   Calcium (mg/dL)  Date Value  54/09/8117 8.5 (L)   Calcium, Total (mg/dL)  Date Value  14/78/2956 8.3 (L)   Albumin (g/dL)  Date Value  21/30/8657 3.0 (L)  10/18/2013 3.4   Phosphorus (mg/dL)  Date Value  84/69/6295 3.8   Sodium (mmol/L)  Date Value  06/20/2023 140  10/18/2013 141   Corrected Ca: 9.3 mg/dL  Assessment  Sheri Barron is a 68 y.o. female presenting with lethargy resulting in a a fall/presyncope. PMH significant for COPD on 2 L oxygen at night, HTN, HLD, prior stroke, & depression with anxiety. Pharmacy has been consulted to monitor and replace electrolytes.  Diet: PO  MIVF: LR @ 150 mL/hr Pertinent medications: none   Goal of Therapy: Electrolytes WNL  Plan:  Mg 1.7 - will order magnesium  2 gm IV x 1 dose  No other electrolyte replacement indicated at this time  Check BMP, Mg, Phos with AM labs  Thank you for allowing pharmacy to be a part of this patient's care.  Simmie Garin, PharmD Pharmacy Resident  06/20/2023 12:44 PM

## 2023-06-20 NOTE — ED Notes (Signed)
 CCMD called to initiate cardiac monitoring

## 2023-06-20 NOTE — Assessment & Plan Note (Signed)
 COPD with acute bronchitis Schedule and as needed nebulizers IV steroids Antitussives Flutter valve and incentive spirometer Continue BiPAP and wean as tolerated

## 2023-06-20 NOTE — Assessment & Plan Note (Signed)
 Sliding scale insulin coverage

## 2023-06-20 NOTE — Evaluation (Signed)
 Occupational Therapy Evaluation Patient Details Name: Sheri Barron MRN: [id number] DOB: 05-14-1955 Today's Date: 06/20/2023   History of Present Illness   Pt is a 68 y.o. female presenting by EMS with lethargy resulting in a fall/presyncope.EMS O2 sat 82% on room air and blood pressure in the 70s. Admitted for management of Acute on chronic respiratory failure with hypoxia and hypercapnia, COPD with acute bronchitis, severe sepsis, lactic acidosis and acute metabolic encephalopathy. PMH of COPD on 2 L oxygen at night, HTN, HLD, prior stroke, depression with anxiety.     Clinical Impressions Pt was seen for OT evaluation this date. PTA, pt resides at home alone with strong family support. She is IND with most ADLs and has someone she pays come in to bathe her in the shower. Pt reports mostly furniture surfing through the house, but has canes set up throughout for easy access. She ambulate short household distances only and only leaves her home for MD appts.   Pt presents to acute OT demonstrating impaired ADL performance and functional mobility 2/2 weakness and low activity tolerance. She is on 3L on entry with sp02 at 90%. Pt currently requires MOD I for bed mobility. Initially pt refused to perform further activity d/t asking for her albuterol  inhaler, however pt needing to have a BM. OT located [facility] for pt to utilize d/t poor endurance. Pt performed SPT from bed<>BSC without AD with CGA. Sp02 dropped to 87% intially and pt was bumped to 4L with drop to 83% after seated peri-care and SPT back to bed with increased time to improve to 93% and left on 4L. Replaced purewick and notified nurse of session. Pt would benefit from skilled OT services to address noted impairments and functional limitations to maximize safety and independence while minimizing falls risk and caregiver burden. Do anticipate the need for follow up OT services upon acute hospital DC, recommend STR d/t increased 02 needs, but may  progress to [facility] level.      If plan is discharge home, recommend the following:   A little help with walking and/or transfers;A lot of help with bathing/dressing/bathroom     Functional Status Assessment   Patient has had a recent decline in their functional status and demonstrates the ability to make significant improvements in function in a reasonable and predictable amount of time.     Equipment Recommendations   BSC/3in1     Recommendations for Other Services         Precautions/Restrictions   Precautions Precautions: Fall Recall of Precautions/Restrictions: Intact Restrictions Weight Bearing Restrictions Per Provider Order: No     Mobility Bed Mobility Overal bed mobility: Modified Independent             General bed mobility comments: no physical assist, pt fatigues easily    Transfers Overall transfer level: Needs assistance Equipment used: None Transfers: Bed to chair/wheelchair/BSC   Stand pivot transfers: Contact guard assist         General transfer comment: bed<>BSC      Balance Overall balance assessment: Needs assistance   Sitting balance-Leahy Scale: Good Sitting balance - Comments: no LOB on BSC over seated EOB   Standing balance support: Single extremity supported, During functional activity Standing balance-Leahy Scale: Fair Standing balance comment: CGA for SPT                           ADL either performed or assessed with clinical judgement   ADL Overall  ADL's : Needs assistance/impaired                         Toilet Transfer: Geophysicist/field seismologist Details (indicate cue type and reason): bed<>BSC Toileting- Clothing Manipulation and Hygiene: Supervision/safety;Sitting/lateral lean Toileting - Clothing Manipulation Details (indicate cue type and reason): after runny BM             Vision         Perception         Praxis         Pertinent  Vitals/Pain Pain Assessment Pain Assessment: No/denies pain     Extremity/Trunk Assessment Upper Extremity Assessment Upper Extremity Assessment: Overall WFL for tasks assessed   Lower Extremity Assessment Lower Extremity Assessment: Generalized weakness       Communication Communication Communication: No apparent difficulties   Cognition Arousal: Alert Behavior During Therapy: WFL for tasks assessed/performed Cognition: No apparent impairments                               Following commands: Intact       Cueing  General Comments      fatigues easily; on 3L on entry with sp02 90-93%, dropped to 83% with SPT only, increased to 4L and maintained at 93% at rest with return to bed   Exercises Other Exercises Other Exercises: Edu on role of OT in acute setting, ECS, pacing, task simplification.   Shoulder Instructions      Home Living Family/patient expects to be discharged to:: Private residence Living Arrangements: Alone Available Help at Discharge: Family;Available PRN/intermittently Type of Home: Mobile home Home Access: Ramped entrance     Home Layout: One level     Bathroom Shower/Tub: Chief Strategy Officer: Standard Bathroom Accessibility: No   Home Equipment: Cane - single point;Tub bench   Additional Comments: Pt reports having canes spread out throughout the house for easy access.      Prior Functioning/Environment Prior Level of Function : Needs assist       Physical Assist : ADLs (physical)   ADLs (physical): Bathing Mobility Comments: canes spaced throughout the house for use, furniture surfs and only walks room to room ADLs Comments: IND with ADLs, pays someone to come in and bathe her in the shower    OT Problem List: Decreased strength;Decreased activity tolerance;Impaired balance (sitting and/or standing)   OT Treatment/Interventions: Self-care/ADL training;Therapeutic exercise;Patient/family  education;Balance training;Energy conservation;DME and/or AE instruction;Therapeutic activities      OT Goals(Current goals can be found in the care plan section)   Acute Rehab OT Goals Patient Stated Goal: improve breathing OT Goal Formulation: With patient/family Time For Goal Achievement: 07/04/23 Potential to Achieve Goals: Good ADL Goals Pt Will Perform Lower Body Dressing: with supervision;sitting/lateral leans;sit to/from stand Pt Will Transfer to Toilet: with supervision;regular height toilet;bedside commode;ambulating Pt Will Perform Toileting - Clothing Manipulation and hygiene: sitting/lateral leans;with modified independence Additional ADL Goal #1: Pt will demo implementation of 1 learned ECS during ADL performance 2/2 trials to prevent overexertion.   OT Frequency:  Min 2X/week    Co-evaluation              AM-PAC OT "6 Clicks" Daily Activity     Outcome Measure Help from another person eating meals?: None Help from another person taking care of personal grooming?: None Help from another person toileting, which includes using toliet, bedpan, or  urinal?: A Little Help from another person bathing (including washing, rinsing, drying)?: A Lot Help from another person to put on and taking off regular upper body clothing?: A Little Help from another person to put on and taking off regular lower body clothing?: A Lot 6 Click Score: 18   End of Session Equipment Utilized During Treatment: Oxygen Nurse Communication: Mobility status  Activity Tolerance: Patient tolerated treatment well Patient left: in bed;with call bell/phone within reach;with family/visitor present  OT Visit Diagnosis: Other abnormalities of gait and mobility (R26.89);Muscle weakness (generalized) (M62.81)                Time: 1501-1540 OT Time Calculation (min): 39 min Charges:  OT General Charges $OT Visit: 1 Visit OT Evaluation $OT Eval Moderate Complexity: 1 Mod OT Treatments $Self  Care/Home Management : 23-37 mins Shruti Arrey, OTR/L  06/20/23, 4:11 PM  Nissa Stannard E Giliana Vantil 06/20/2023, 4:05 PM

## 2023-06-20 NOTE — Consult Note (Addendum)
 WOC Nurse Consult Note: Reason for Consult: Requested to assess wound on sacrum. Performed remotely after analyzed photo and notes. Wound type: Pressure injuries stage 3 on both sides of her sacrum. One midline. Pressure Injury POA: Yes Measurement: Left aprox. 2 x 1.5 x 0.1 cm Right aprox 2 x 1.3 x 0.1 cm  Midline 0.2 x 0.5 cm Wound bed: 100% yellow. Drainage (amount, consistency, odor) Minimum amount. Serous, no odor. Periwound: Intact Dressing procedure/placement/frequency: Apply Xeroform on the wound bed (change daily). Cover with sacrum foam dressing. Change every 3 days or PRN.  WOC team will not plan to follow further.  Please reconsult if further assistance is needed. Thank-you,  Rachel Budds BSN, RN, [employer], WOC  (Pager: [phone])

## 2023-06-20 NOTE — Assessment & Plan Note (Signed)
 Hold sedating meds of buspirone  and trazodone  for now Resume venlafaxine  pending med verification

## 2023-06-20 NOTE — Assessment & Plan Note (Signed)
Expecting improvement with IV hydration

## 2023-06-20 NOTE — ED Notes (Signed)
 Pt taken off bipap at this time to trial on nasal cannula. Pt placed on 2.5L via [place] per at home PRN requirement. Bed alarm on. Grip socks on. Bed in lowest position and fall risk bracelet on.

## 2023-06-20 NOTE — Progress Notes (Signed)
 CODE SEPSIS - PHARMACY COMMUNICATION  **Broad Spectrum Antibiotics should be administered within 1 hour of Sepsis diagnosis**  Time Code Sepsis Called/Page Received: 6/8 @ 2325   Antibiotics Ordered: Vancomycin , Cefepime   Time of 1st antibiotic administration: Vancomycin 1 gm IV X 1 on 6/9 @ 0021  Additional action taken by pharmacy:   If necessary, Name of Provider/Nurse Contacted:     Ellieana Dolecki D ,PharmD Clinical Pharmacist  06/20/2023  12:30 AM

## 2023-06-20 NOTE — Assessment & Plan Note (Addendum)
 Lactic acidosis Elevated lactic acid could be related to respiratory failure versus related to sepsis Severe sepsis criteria will include hypotension recorded with EMS, leukocytosis lactic acidosis respiratory failure, AKI and encephalopathy Pan CT scan without identified source Follow UA and procalcitonin Received sepsis fluids in the ED Will continue sepsis fluids Will continue broad-spectrum antibiotics Procalcitonin and de-escalate if negative

## 2023-06-20 NOTE — Final Progress Note (Signed)
 Same-day rounding progress note  Patient seen and examined in the ED.  Waiting for the floor bed.  I agree with assessment and plan dictated by Dr. Vallarie Gauze.  Please see her dictated H&P for further details.  Acute on chronic hypoxic and hypercapnic respiratory failure Was requiring BiPAP when I saw her.  Wean her off to 2 L oxygen via nasal cannula for now. Continue weaning her oxygen.  At baseline she uses 2 L oxygen at night.  Check ambulatory pulse ox  Acute metabolic encephalopathy Multifactorial.  Likely CO2 narcosis, overuse of sedating medications Hold narcotics and benzos for now Negative head CT Obtain PT and OT eval Per nursing, now she is awake and eating lunch.  Anxiety with depression Resume venlafaxine   Time spent 35 minutes

## 2023-06-20 NOTE — Assessment & Plan Note (Addendum)
 Possibilities include presyncope,CO2 narcosis ,sepsis, sedating meds Head CT non acute Neurologic checks with fall and aspiration precautions Continuous cardiac monitoring Treat acute etiologies as previously outlined Hold any sedating meds

## 2023-06-20 NOTE — Progress Notes (Signed)
 Pharmacy Antibiotic Note  Sheri Barron is a 68 y.o. female admitted on 06/19/2023 with sepsis.  Pharmacy has been consulted for Vancomycin, Cefepime dosing.  Plan: Cefepime 2 gm IV X 1 given in ED on 6/9 @ 0038. Cefepime 2 gm IV Q12H ordered to start on 6/9 @ 1230.  Vancomycin 1 gm IV X 1 given in ED on 6/9 @ 0021. Vancomycin 1250 mg IV Q48H ordered to start on 6/11 @ 0000.  AUC = 479.5 Vanc trough = 8.8   Weight: 56.6 kg (124 lb 12.5 oz)  Temp (24hrs), Avg:98.8 F (37.1 C), Min:98 F (36.7 C), Max:99.5 F (37.5 C)  Recent Labs  Lab 06/19/23 2255 06/19/23 2306  WBC 17.4*  --   CREATININE 1.18*  --   LATICACIDVEN  --  2.2*    CrCl cannot be calculated (Unknown ideal weight.).    Allergies  Allergen Reactions   Sulfa Antibiotics Nausea Only    Patient states she gets dehydrated with sulfa as well.    Antimicrobials this admission:   >>   >>   Dose adjustments this admission:   Microbiology results:  BCx:  UCx:    Sputum:    MRSA PCR:   Thank you for allowing pharmacy to be a part of this patient's care.  Louna Rothgeb D 06/20/2023 2:38 AM

## 2023-06-20 NOTE — H&P (Addendum)
 History and Physical    Patient: Sheri Barron ZOX:[id number] DOB: [birth year] DOA: 06/19/2023 DOS: the patient was seen and examined on 06/20/2023 PCP: Patrina Boos, MD  Patient coming from: Home  Chief Complaint: fall  HPI: Sheri Barron is a 68 y.o. female with medical history significant for COPD on 2 L oxygen at night, HTN, HLD, prior stroke, depression with anxiety, presenting by EMS with lethargy resulting in a fall/presyncope. Patient cannot quite remember the circumstances surrounding how she fell.   EMS O2 sat 82% on room air and blood pressure in the 70s.  She had no injury.  Was administered DuoNeb, Solu-Medrol  and magnesium  en route. Granddaughter who arrives later at bedside stated that she could not get in touch with her for the past 2 days and went to check on her and found her on the floor crouched over.  States patient was unable to describe how she got on the floor and whether she fell and she sounded very groggy.  She called EMS at that time.  She was able to help her up to her bed and on arrival of EMS they were able to walk her out to the waiting stretcher. On arrival in the ED, vitals were unremarkable, labs notable for WBC 17,000 with lactic acid 2.2. VBG on 2 L showed pH 7.4, PCO285 with bicarb 52.7 Respiratory viral panel negative hemoglobin 11.1 CMP notable for potassium 3.3. Creatinine 1.18 which is up from baseline of 0.84 bicarb 38 Troponin 19 EtOH less than 50 UA pending EKG showing sinus at 72 CT head and C-spine nonacute CT chest abdomen and pelvis nontraumatic but showing progressive, bronchitis. Head CT non acute  Patient treated for sepsis with LR boluses and broad-spectrum antibiotics given an additional DuoNeb.  Being started on BiPAP as admission is requested     Past Medical History:  Diagnosis Date   Anxiety    Asthma    Chronic constipation    COPD (chronic obstructive pulmonary disease) (HCC)    Depression    Diabetes mellitus without  complication (HCC)    GERD (gastroesophageal reflux disease)    Hypertension    Stroke [facility])    Past Surgical History:  Procedure Laterality Date   ABDOMINAL HYSTERECTOMY     APPENDECTOMY     CHOLECYSTECTOMY     COLONOSCOPY WITH PROPOFOL  N/A 09/04/2014   Procedure: COLONOSCOPY WITH PROPOFOL ;  Surgeon: Cassie Click, MD;  Location: [facility] ENDOSCOPY;  Service: Endoscopy;  Laterality: N/A;   COLONOSCOPY WITH PROPOFOL  N/A 10/21/2017   Procedure: COLONOSCOPY WITH PROPOFOL ;  Surgeon: Cassie Click, MD;  Location: [facility] ENDOSCOPY;  Service: Endoscopy;  Laterality: N/A;   EGD-colonsocopy     ESOPHAGOGASTRODUODENOSCOPY (EGD) WITH PROPOFOL  N/A 09/04/2014   Procedure: ESOPHAGOGASTRODUODENOSCOPY (EGD) WITH PROPOFOL ;  Surgeon: Cassie Click, MD;  Location: [facility] ENDOSCOPY;  Service: Endoscopy;  Laterality: N/A;   SAVORY DILATION N/A 09/04/2014   Procedure: SAVORY DILATION;  Surgeon: Cassie Click, MD;  Location: [facility] ENDOSCOPY;  Service: Endoscopy;  Laterality: N/A;   Social History:  reports that she has been smoking cigarettes. She has never used smokeless tobacco. She reports current drug use. Drug: Marijuana. She reports that she does not drink alcohol .  Allergies  Allergen Reactions   Sulfa Antibiotics Nausea Only    Patient states she gets dehydrated with sulfa as well.    Family History  Problem Relation Age of Onset   Breast cancer Neg Hx     Prior to Admission medications  Medication Sig Start Date End Date Taking? Authorizing Provider  albuterol  (PROVENTIL  HFA;VENTOLIN  HFA) 108 (90 BASE) MCG/ACT inhaler Inhale 2 puffs into the lungs every 6 (six) hours as needed for wheezing or shortness of breath. 12/06/14   Ruth Cove, MD  albuterol  (PROVENTIL ) (2.5 MG/3ML) 0.083% nebulizer solution Take 2.5 mg by nebulization every 6 (six) hours as needed.    [provider]  aspirin  EC 81 MG tablet Take 1 tablet (81 mg total) by mouth daily. Swallow whole. 12/05/22    Alexander, Natalie, DO  baclofen  (LIORESAL ) 10 MG tablet Take 10 mg by mouth 3 (three) times daily. 11/08/22   [provider]  busPIRone  (BUSPAR ) 15 MG tablet Take 15 mg by mouth 2 (two) times daily.    [provider]  dextromethorphan -guaiFENesin  (MUCINEX  DM) 30-600 MG 12hr tablet Take 2 tablets by mouth 2 (two) times daily as needed for cough. 12/04/22   Alexander, Natalie, DO  fluticasone  (FLONASE ) 50 MCG/ACT nasal spray Place 1 spray into both nostrils 2 (two) times daily.     [provider]  ibuprofen (ADVIL,MOTRIN) 200 MG tablet Take 800 mg by mouth 3 (three) times daily as needed for moderate pain.    [provider]  lidocaine  (LIDODERM ) 5 % Place 1 patch onto the skin daily as needed. For pain    [provider]  loratadine  (CLARITIN ) 10 MG tablet Take 10 mg by mouth daily.    [provider]  metFORMIN  (GLUCOPHAGE ) 500 MG tablet Take 500 mg by mouth 2 (two) times daily with a meal.    [provider]  nicotine  (NICODERM CQ  - DOSED IN MG/24 HOURS) 21 mg/24hr patch Place 1 patch (21 mg total) onto the skin daily. 12/05/22   Alexander, Natalie, DO  omeprazole (PRILOSEC) 20 MG capsule Take 20 mg by mouth daily.    [provider]  polyethylene glycol (MIRALAX  / GLYCOLAX ) packet Take 17 g by mouth at bedtime.    [provider]  propranolol  (INDERAL ) 20 MG tablet Take 40 mg by mouth 2 (two) times daily.     [provider]  tiotropium (SPIRIVA ) 18 MCG inhalation capsule Place 18 mcg into inhaler and inhale daily.    [provider]  traZODone  (DESYREL ) 100 MG tablet Take 300 mg by mouth at bedtime.     [provider]  venlafaxine  XR (EFFEXOR -XR) 150 MG 24 hr capsule Take 150 mg by mouth daily with breakfast.    [provider]    Physical Exam: Vitals:   06/19/23 2309 06/19/23 2311 06/19/23 2317 06/20/23 0023  BP:  101/79    Pulse:  72    Temp:  98 F (36.7 C) 99.5 F  (37.5 C)   TempSrc:  Oral Rectal   SpO2: 94% 95%    Weight:    56.6 kg   Physical Exam Vitals and nursing note reviewed.  Constitutional:      General: She is sleeping. She is not in acute distress.    Comments: Arousable  HENT:     Head: Normocephalic and atraumatic.  Cardiovascular:     Rate and Rhythm: Normal rate and regular rhythm.     Heart sounds: Normal heart sounds.  Pulmonary:     Effort: Pulmonary effort is normal.     Breath sounds: Normal breath sounds.     Comments: BiPAP mask: Abdominal:     Palpations: Abdomen is soft.     Tenderness: There is no abdominal tenderness.  Neurological:  General: No focal deficit present.     Mental Status: She is lethargic.     Labs on Admission: I have personally reviewed following labs and imaging studies  CBC: Recent Labs  Lab 06/19/23 2255  WBC 17.4*  NEUTROABS 13.9*  HGB 11.1*  HCT 36.4  MCV 87.3  PLT 301   Basic Metabolic Panel: Recent Labs  Lab 06/19/23 2255  NA 138  K 3.3*  CL 85*  CO2 38*  GLUCOSE 148*  BUN 28*  CREATININE 1.18*  CALCIUM 8.8*   GFR: CrCl cannot be calculated (Unknown ideal weight.). Liver Function Tests: Recent Labs  Lab 06/19/23 2255  AST 27  ALT 13  ALKPHOS 83  BILITOT 0.4  PROT 6.7  ALBUMIN 3.0*   No results for input(s): "LIPASE", "AMYLASE" in the last 168 hours. No results for input(s): "AMMONIA" in the last 168 hours. Coagulation Profile: Recent Labs  Lab 06/19/23 2306  INR 1.1   Cardiac Enzymes: No results for input(s): "CKTOTAL", "CKMB", "CKMBINDEX", "TROPONINI" in the last 168 hours. BNP (last 3 results) No results for input(s): "PROBNP" in the last 8760 hours. HbA1C: No results for input(s): "HGBA1C" in the last 72 hours. CBG: Recent Labs  Lab 06/19/23 2258  GLUCAP 150*   Lipid Profile: No results for input(s): "CHOL", "HDL", "LDLCALC", "TRIG", "CHOLHDL", "LDLDIRECT" in the last 72 hours. Thyroid Function Tests: No results for input(s):  "TSH", "T4TOTAL", "FREET4", "T3FREE", "THYROIDAB" in the last 72 hours. Anemia Panel: No results for input(s): "VITAMINB12", "FOLATE", "FERRITIN", "TIBC", "IRON", "RETICCTPCT" in the last 72 hours. Urine analysis:    Component Value Date/Time   COLORURINE YELLOW (A) 11/28/2022 1850   APPEARANCEUR CLEAR (A) 11/28/2022 1850   LABSPEC 1.039 (H) 11/28/2022 1850   PHURINE 5.0 11/28/2022 1850   GLUCOSEU NEGATIVE 11/28/2022 1850   HGBUR NEGATIVE 11/28/2022 1850   BILIRUBINUR NEGATIVE 11/28/2022 1850   KETONESUR 20 (A) 11/28/2022 1850   PROTEINUR NEGATIVE 11/28/2022 1850   NITRITE NEGATIVE 11/28/2022 1850   LEUKOCYTESUR NEGATIVE 11/28/2022 1850    Radiological Exams on Admission: CT HEAD WO CONTRAST ( ) Result Date: 06/20/2023 CLINICAL DATA:  Neck trauma, mental status change, fall, hypotension. Patient does not remember falling and is unsure if passed out. History of COPD wearing 2 L of oxygen at baseline. 82% on room air. Sepsis. EXAM: CT HEAD WITHOUT CONTRAST CT CERVICAL SPINE WITHOUT CONTRAST CT CHEST, ABDOMEN AND PELVIS WITHOUT CONTRAST TECHNIQUE: Contiguous axial images were obtained from the base of the skull through the vertex without intravenous contrast. Multidetector CT imaging of the cervical spine was performed without intravenous contrast. Multiplanar CT image reconstructions were also generated. Multidetector CT imaging of the chest, abdomen and pelvis was performed following the standard protocol without IV contrast. RADIATION DOSE REDUCTION: This exam was performed according to the departmental dose-optimization program which includes automated exposure control, adjustment of the mA and/or kV according to patient size and/or use of iterative reconstruction technique. COMPARISON:  Chest radiograph 06/19/2023; CTA chest 11/28/2022; CT abdomen pelvis 04/13/2016; CT head 09/26/2006 FINDINGS: CT HEAD FINDINGS Brain: No evidence of acute infarction, hemorrhage, hydrocephalus, extra-axial  collection or mass lesion/mass effect. Vascular: No hyperdense vessel or unexpected calcification. Skull: Normal. Negative for fracture or focal lesion. Sinuses/Orbits: No acute finding. Other: None. CT CERVICAL FINDINGS Alignment: No evidence of traumatic listhesis. Skull base and vertebrae: No acute fracture. Soft tissues and spinal canal: No prevertebral fluid or swelling. No visible canal hematoma. Disc levels: Multilevel spondylosis, disc space height loss, and degenerative endplate changes  greatest at C5-C6 and C6-C7 where it is moderate. Posterior disc osteophyte complex at C5-C6 and C6-C7 cause is moderate effacement of the thecal sac. Uncovertebral spurring and facet arthropathy causes multilevel neural foraminal narrowing greatest on the left at C5-C6 and bilaterally at C6-C7. Other: None. CT CHEST FINDINGS Cardiovascular: Normal heart size. No pericardial effusion. Normal caliber thoracic aorta. Mediastinum/Nodes: Trachea and esophagus are unremarkable. Right hilar lymphadenopathy. For example 1.3 cm lymph node on series 2/image 29 additional mediastinal lymphadenopathy with a 1.2 cm left paratracheal node (series 2/image 25). Left suprahilar lymphadenopathy measuring 11 mm (series 2/image 24). These are enlarged compared to 11/28/2022. Lungs/Pleura: Chronic bronchial wall thickening and bronchiolectasis. Diffuse bilateral centrilobular micro nodules and tree-in-bud opacities are progressed compared to prior. No focal pneumonia. Centrilobular and paraseptal emphysema in the upper lobes. No pleural effusion or pneumothorax. Musculoskeletal: No acute fracture. CT ABDOMEN AND PELVIS FINDINGS Hepatobiliary: Hepatic steatosis. Cholecystectomy. No acute abnormality. Pancreas: Unremarkable. Spleen: Unremarkable. Adrenals/Urinary Tract: Chronic thickening of the left adrenal gland. Unremarkable right adrenal gland. No urinary calculi or hydronephrosis. Unremarkable bladder Stomach/Bowel: Normal caliber large and  small bowel. Moderate stool in the right colon. No bowel wall thickening. The appendix is not visualized. Stomach is within normal limits. Duodenal diverticulum. Vascular/Lymphatic: Aortic atherosclerosis. No enlarged abdominal or pelvic lymph nodes. Reproductive: Hysterectomy.  No adnexal mass. Other: No free intraperitoneal fluid or air. Musculoskeletal: No acute fracture. IMPRESSION: 1. No acute intracranial abnormality. 2. No acute fracture or traumatic listhesis of the cervical spine. 3. No acute traumatic abnormality in the chest, abdomen, or pelvis. 4. Progressive bronchitis/bronchiolitis compared to 11/28/2022. 5. Mediastinal and bilateral hilar lymphadenopathy, likely reactive. Continued attention on follow-up. 6. Hepatic steatosis. Aortic Atherosclerosis (ICD10-I70.0) and Emphysema (ICD10-J43.9). Electronically Signed   By: Rozell Cornet M.D.   On: 06/20/2023 00:38   CT CHEST ABDOMEN PELVIS W CONTRAST Result Date: 06/20/2023 CLINICAL DATA:  Neck trauma, mental status change, fall, hypotension. Patient does not remember falling and is unsure if passed out. History of COPD wearing 2 L of oxygen at baseline. 82% on room air. Sepsis. EXAM: CT HEAD WITHOUT CONTRAST CT CERVICAL SPINE WITHOUT CONTRAST CT CHEST, ABDOMEN AND PELVIS WITHOUT CONTRAST TECHNIQUE: Contiguous axial images were obtained from the base of the skull through the vertex without intravenous contrast. Multidetector CT imaging of the cervical spine was performed without intravenous contrast. Multiplanar CT image reconstructions were also generated. Multidetector CT imaging of the chest, abdomen and pelvis was performed following the standard protocol without IV contrast. RADIATION DOSE REDUCTION: This exam was performed according to the departmental dose-optimization program which includes automated exposure control, adjustment of the mA and/or kV according to patient size and/or use of iterative reconstruction technique. COMPARISON:  Chest  radiograph 06/19/2023; CTA chest 11/28/2022; CT abdomen pelvis 04/13/2016; CT head 09/26/2006 FINDINGS: CT HEAD FINDINGS Brain: No evidence of acute infarction, hemorrhage, hydrocephalus, extra-axial collection or mass lesion/mass effect. Vascular: No hyperdense vessel or unexpected calcification. Skull: Normal. Negative for fracture or focal lesion. Sinuses/Orbits: No acute finding. Other: None. CT CERVICAL FINDINGS Alignment: No evidence of traumatic listhesis. Skull base and vertebrae: No acute fracture. Soft tissues and spinal canal: No prevertebral fluid or swelling. No visible canal hematoma. Disc levels: Multilevel spondylosis, disc space height loss, and degenerative endplate changes greatest at C5-C6 and C6-C7 where it is moderate. Posterior disc osteophyte complex at C5-C6 and C6-C7 cause is moderate effacement of the thecal sac. Uncovertebral spurring and facet arthropathy causes multilevel neural foraminal narrowing greatest on the left at  C5-C6 and bilaterally at C6-C7. Other: None. CT CHEST FINDINGS Cardiovascular: Normal heart size. No pericardial effusion. Normal caliber thoracic aorta. Mediastinum/Nodes: Trachea and esophagus are unremarkable. Right hilar lymphadenopathy. For example 1.3 cm lymph node on series 2/image 29 additional mediastinal lymphadenopathy with a 1.2 cm left paratracheal node (series 2/image 25). Left suprahilar lymphadenopathy measuring 11 mm (series 2/image 24). These are enlarged compared to 11/28/2022. Lungs/Pleura: Chronic bronchial wall thickening and bronchiolectasis. Diffuse bilateral centrilobular micro nodules and tree-in-bud opacities are progressed compared to prior. No focal pneumonia. Centrilobular and paraseptal emphysema in the upper lobes. No pleural effusion or pneumothorax. Musculoskeletal: No acute fracture. CT ABDOMEN AND PELVIS FINDINGS Hepatobiliary: Hepatic steatosis. Cholecystectomy. No acute abnormality. Pancreas: Unremarkable. Spleen: Unremarkable.  Adrenals/Urinary Tract: Chronic thickening of the left adrenal gland. Unremarkable right adrenal gland. No urinary calculi or hydronephrosis. Unremarkable bladder Stomach/Bowel: Normal caliber large and small bowel. Moderate stool in the right colon. No bowel wall thickening. The appendix is not visualized. Stomach is within normal limits. Duodenal diverticulum. Vascular/Lymphatic: Aortic atherosclerosis. No enlarged abdominal or pelvic lymph nodes. Reproductive: Hysterectomy.  No adnexal mass. Other: No free intraperitoneal fluid or air. Musculoskeletal: No acute fracture. IMPRESSION: 1. No acute intracranial abnormality. 2. No acute fracture or traumatic listhesis of the cervical spine. 3. No acute traumatic abnormality in the chest, abdomen, or pelvis. 4. Progressive bronchitis/bronchiolitis compared to 11/28/2022. 5. Mediastinal and bilateral hilar lymphadenopathy, likely reactive. Continued attention on follow-up. 6. Hepatic steatosis. Aortic Atherosclerosis (ICD10-I70.0) and Emphysema (ICD10-J43.9). Electronically Signed   By: Rozell Cornet M.D.   On: 06/20/2023 00:38   CT Cervical Spine Wo Contrast Result Date: 06/20/2023 CLINICAL DATA:  Neck trauma, mental status change, fall, hypotension. Patient does not remember falling and is unsure if passed out. History of COPD wearing 2 L of oxygen at baseline. 82% on room air. Sepsis. EXAM: CT HEAD WITHOUT CONTRAST CT CERVICAL SPINE WITHOUT CONTRAST CT CHEST, ABDOMEN AND PELVIS WITHOUT CONTRAST TECHNIQUE: Contiguous axial images were obtained from the base of the skull through the vertex without intravenous contrast. Multidetector CT imaging of the cervical spine was performed without intravenous contrast. Multiplanar CT image reconstructions were also generated. Multidetector CT imaging of the chest, abdomen and pelvis was performed following the standard protocol without IV contrast. RADIATION DOSE REDUCTION: This exam was performed according to the  departmental dose-optimization program which includes automated exposure control, adjustment of the mA and/or kV according to patient size and/or use of iterative reconstruction technique. COMPARISON:  Chest radiograph 06/19/2023; CTA chest 11/28/2022; CT abdomen pelvis 04/13/2016; CT head 09/26/2006 FINDINGS: CT HEAD FINDINGS Brain: No evidence of acute infarction, hemorrhage, hydrocephalus, extra-axial collection or mass lesion/mass effect. Vascular: No hyperdense vessel or unexpected calcification. Skull: Normal. Negative for fracture or focal lesion. Sinuses/Orbits: No acute finding. Other: None. CT CERVICAL FINDINGS Alignment: No evidence of traumatic listhesis. Skull base and vertebrae: No acute fracture. Soft tissues and spinal canal: No prevertebral fluid or swelling. No visible canal hematoma. Disc levels: Multilevel spondylosis, disc space height loss, and degenerative endplate changes greatest at C5-C6 and C6-C7 where it is moderate. Posterior disc osteophyte complex at C5-C6 and C6-C7 cause is moderate effacement of the thecal sac. Uncovertebral spurring and facet arthropathy causes multilevel neural foraminal narrowing greatest on the left at C5-C6 and bilaterally at C6-C7. Other: None. CT CHEST FINDINGS Cardiovascular: Normal heart size. No pericardial effusion. Normal caliber thoracic aorta. Mediastinum/Nodes: Trachea and esophagus are unremarkable. Right hilar lymphadenopathy. For example 1.3 cm lymph node on series 2/image 29 additional  mediastinal lymphadenopathy with a 1.2 cm left paratracheal node (series 2/image 25). Left suprahilar lymphadenopathy measuring 11 mm (series 2/image 24). These are enlarged compared to 11/28/2022. Lungs/Pleura: Chronic bronchial wall thickening and bronchiolectasis. Diffuse bilateral centrilobular micro nodules and tree-in-bud opacities are progressed compared to prior. No focal pneumonia. Centrilobular and paraseptal emphysema in the upper lobes. No pleural  effusion or pneumothorax. Musculoskeletal: No acute fracture. CT ABDOMEN AND PELVIS FINDINGS Hepatobiliary: Hepatic steatosis. Cholecystectomy. No acute abnormality. Pancreas: Unremarkable. Spleen: Unremarkable. Adrenals/Urinary Tract: Chronic thickening of the left adrenal gland. Unremarkable right adrenal gland. No urinary calculi or hydronephrosis. Unremarkable bladder Stomach/Bowel: Normal caliber large and small bowel. Moderate stool in the right colon. No bowel wall thickening. The appendix is not visualized. Stomach is within normal limits. Duodenal diverticulum. Vascular/Lymphatic: Aortic atherosclerosis. No enlarged abdominal or pelvic lymph nodes. Reproductive: Hysterectomy.  No adnexal mass. Other: No free intraperitoneal fluid or air. Musculoskeletal: No acute fracture. IMPRESSION: 1. No acute intracranial abnormality. 2. No acute fracture or traumatic listhesis of the cervical spine. 3. No acute traumatic abnormality in the chest, abdomen, or pelvis. 4. Progressive bronchitis/bronchiolitis compared to 11/28/2022. 5. Mediastinal and bilateral hilar lymphadenopathy, likely reactive. Continued attention on follow-up. 6. Hepatic steatosis. Aortic Atherosclerosis (ICD10-I70.0) and Emphysema (ICD10-J43.9). Electronically Signed   By: Rozell Cornet M.D.   On: 06/20/2023 00:38   DG Chest Portable 1 View Result Date: 06/19/2023 CLINICAL DATA:  Shortness of breath EXAM: PORTABLE CHEST 1 VIEW COMPARISON:  11/28/2022 FINDINGS: Cardiac shadow is stable. The lungs are well aerated bilaterally. Mild central vascular congestion is noted without significant edema. No bony abnormality is noted. IMPRESSION: Mild vascular congestion without significant edema. Electronically Signed   By: Violeta Grey M.D.   On: 06/19/2023 23:09   Data Reviewed for HPI: Relevant notes from primary care and specialist visits, past discharge summaries as available in EHR, including Care Everywhere. Prior diagnostic testing as pertinent  to current admission diagnoses Updated medications and problem lists for reconciliation ED course, including vitals, labs, imaging, treatment and response to treatment Triage notes, nursing and pharmacy notes and ED provider's notes Notable results as noted above in HPI      Assessment and Plan: * Acute on chronic respiratory failure with hypoxia and hypercapnia (HCC) COPD with acute bronchitis Schedule and as needed nebulizers IV steroids Antitussives Flutter valve and incentive spirometer Continue BiPAP and wean as tolerated   Severe sepsis (HCC) Lactic acidosis Elevated lactic acid could be related to respiratory failure versus related to sepsis Severe sepsis criteria will include hypotension recorded with EMS, leukocytosis lactic acidosis respiratory failure, AKI and encephalopathy Pan CT scan without identified source Follow UA and procalcitonin Received sepsis fluids in the ED Will continue sepsis fluids Will continue broad-spectrum antibiotics Procalcitonin and de-escalate if negative  Fall at home, initial encounter Pan CT negative for acute injury PT /OT eval  Acute metabolic encephalopathy Possibilities include presyncope,CO2 narcosis ,sepsis, sedating meds Head CT non acute Neurologic checks with fall and aspiration precautions Continuous cardiac monitoring Treat acute etiologies as previously outlined Hold any sedating meds  AKI (acute kidney injury) (HCC) Expecting improvement with IV hydration  Diabetes mellitus without complication (HCC) Sliding scale insulin  coverage  Depression with anxiety Hold sedating meds of buspirone  and trazodone  for now Resume venlafaxine  pending med verification     DVT prophylaxis: Lovenox   Consults: none  Advance Care Planning:   Code Status: Prior   Family Communication: none  Disposition Plan: Back to previous home environment  Severity of  Illness: The appropriate patient status for this patient is  OBSERVATION. Observation status is judged to be reasonable and necessary in order to provide the required intensity of service to ensure the patient's safety. The patient's presenting symptoms, physical exam findings, and initial radiographic and laboratory data in the context of their medical condition is felt to place them at decreased risk for further clinical deterioration. Furthermore, it is anticipated that the patient will be medically stable for discharge from the hospital within 2 midnights of admission.   Author: Lanetta Pion, MD 06/20/2023 1:30 AM  For on call review [email or web address].

## 2023-06-20 NOTE — ED Notes (Signed)
 Called to notify floor of departure from ED.

## 2023-06-20 NOTE — Assessment & Plan Note (Signed)
 Pan CT negative for acute injury PT /OT eval

## 2023-06-21 DIAGNOSIS — G9341 Metabolic encephalopathy: Secondary | ICD-10-CM

## 2023-06-21 DIAGNOSIS — J9621 Acute and chronic respiratory failure with hypoxia: Secondary | ICD-10-CM | POA: Diagnosis not present

## 2023-06-21 DIAGNOSIS — E872 Acidosis, unspecified: Secondary | ICD-10-CM

## 2023-06-21 DIAGNOSIS — J441 Chronic obstructive pulmonary disease with (acute) exacerbation: Secondary | ICD-10-CM | POA: Diagnosis not present

## 2023-06-21 LAB — CBC
HCT: 31.1 % — ABNORMAL LOW (ref 36.0–46.0)
Hemoglobin: 9.1 g/dL — ABNORMAL LOW (ref 12.0–15.0)
MCH: 26.4 pg (ref 26.0–34.0)
MCHC: 29.3 g/dL — ABNORMAL LOW (ref 30.0–36.0)
MCV: 90.1 fL (ref 80.0–100.0)
Platelets: 253 10*3/uL (ref 150–400)
RBC: 3.45 MIL/uL — ABNORMAL LOW (ref 3.87–5.11)
RDW: 14.6 % (ref 11.5–15.5)
WBC: 12 10*3/uL — ABNORMAL HIGH (ref 4.0–10.5)
nRBC: 0 % (ref 0.0–0.2)

## 2023-06-21 LAB — BASIC METABOLIC PANEL WITH GFR
Anion gap: 11 (ref 5–15)
BUN: 18 mg/dL (ref 8–23)
CO2: 38 mmol/L — ABNORMAL HIGH (ref 22–32)
Calcium: 8.8 mg/dL — ABNORMAL LOW (ref 8.9–10.3)
Chloride: 94 mmol/L — ABNORMAL LOW (ref 98–111)
Creatinine, Ser: 0.75 mg/dL (ref 0.44–1.00)
GFR, Estimated: >60 mL/min (ref >60)
Glucose, Bld: 89 mg/dL (ref 70–99)
Potassium: 3.5 mmol/L (ref 3.5–5.1)
Sodium: 143 mmol/L (ref 135–145)

## 2023-06-21 LAB — PHOSPHORUS: Phosphorus: 2.1 mg/dL — ABNORMAL LOW (ref 2.5–4.6)

## 2023-06-21 LAB — MRSA NEXT GEN BY PCR, NASAL: MRSA by PCR Next Gen: NOT DETECTED

## 2023-06-21 LAB — PROCALCITONIN: Procalcitonin: <0.1 ng/mL

## 2023-06-21 LAB — MAGNESIUM: Magnesium: 2 mg/dL (ref 1.7–2.4)

## 2023-06-21 MED ORDER — CLONAZEPAM 0.5 MG PO TABS: 0.5000 mg | ORAL_TABLET | Freq: Two times a day (BID) | ORAL | Status: DC

## 2023-06-21 MED ORDER — ALBUTEROL SULFATE (2.5 MG/3ML) 0.083% IN NEBU: 2.5000 mg | INHALATION_SOLUTION | RESPIRATORY_TRACT | Status: DC | PRN

## 2023-06-21 MED ORDER — VANCOMYCIN HCL IN DEXTROSE 1-5 GM/200ML-% IV SOLN
1000.0000 mg | INTRAVENOUS | Status: DC
  Administered 2023-06-21: 1000 mg via INTRAVENOUS
  Filled 2023-06-21: qty 200

## 2023-06-21 MED ORDER — PROPRANOLOL HCL 40 MG PO TABS
80.0000 mg | ORAL_TABLET | Freq: Two times a day (BID) | ORAL | Status: DC
  Administered 2023-06-21 – 2023-06-22 (×3): 80 mg via ORAL
  Filled 2023-06-21 (×3): qty 2

## 2023-06-21 MED ORDER — ADULT MULTIVITAMIN W/MINERALS CH
1.0000 | ORAL_TABLET | Freq: Every day | ORAL | Status: DC
  Administered 2023-06-21 – 2023-06-24 (×4): 1 via ORAL
  Filled 2023-06-21 (×4): qty 1

## 2023-06-21 MED ORDER — ALBUTEROL SULFATE (2.5 MG/3ML) 0.083% IN NEBU
2.5000 mg | INHALATION_SOLUTION | RESPIRATORY_TRACT | Status: DC | PRN
  Administered 2023-06-21 – 2023-06-23 (×7): 2.5 mg via RESPIRATORY_TRACT
  Filled 2023-06-21 (×5): qty 3

## 2023-06-21 MED ORDER — VITAMIN C 500 MG PO TABS
500.0000 mg | ORAL_TABLET | Freq: Two times a day (BID) | ORAL | Status: DC
  Administered 2023-06-21 – 2023-06-24 (×7): 500 mg via ORAL
  Filled 2023-06-21 (×7): qty 1

## 2023-06-21 MED ORDER — ZINC SULFATE 220 (50 ZN) MG PO CAPS
220.0000 mg | ORAL_CAPSULE | Freq: Every day | ORAL | Status: DC
  Administered 2023-06-21 – 2023-06-24 (×4): 220 mg via ORAL
  Filled 2023-06-21 (×4): qty 1

## 2023-06-21 MED ORDER — ALBUTEROL SULFATE (2.5 MG/3ML) 0.083% IN NEBU
2.5000 mg | INHALATION_SOLUTION | Freq: Two times a day (BID) | RESPIRATORY_TRACT | Status: DC
  Administered 2023-06-21: 2.5 mg via RESPIRATORY_TRACT
  Filled 2023-06-21: qty 3

## 2023-06-21 MED ORDER — K PHOS MONO-SOD PHOS DI & MONO 155-852-130 MG PO TABS
250.0000 mg | ORAL_TABLET | Freq: Two times a day (BID) | ORAL | Status: AC
  Administered 2023-06-21 (×2): 250 mg via ORAL
  Filled 2023-06-21 (×2): qty 1

## 2023-06-21 MED ORDER — BUSPIRONE HCL 10 MG PO TABS
15.0000 mg | ORAL_TABLET | Freq: Two times a day (BID) | ORAL | Status: DC
  Administered 2023-06-21 – 2023-06-24 (×6): 15 mg via ORAL
  Filled 2023-06-21 (×6): qty 2

## 2023-06-21 NOTE — Progress Notes (Signed)
 The patient requested to take off her BiPAP at 500am and to put on her oxygen 2LPM via [place]. This Clinical research associate called respiratory and instructed this writer to put the BiPAP on stand by and to put the patient on O2 @ 2LPM via [place]. The patient's O2 Sat was 96% right before she came off of the Bipap. Minutes after being put on O2 the patient c/o SOB. The patient de- sated to 84% on 2LPM. This Clinical research associate notified Respiratory and asked them to come see the patient. Respiratory put the patient back on BiPAP. O2 Sat came up to 94%. The patient continues on BiPAP.

## 2023-06-21 NOTE — Progress Notes (Signed)
 PT Cancellation Note  Patient Details Name: Sheri Barron MRN: [id number] DOB: 07-02-1955   Cancelled Treatment:    Reason Eval/Treat Not Completed: Other (comment): Orders Received, Chart Reviewed. Therapist attempted evaluation x 2 attempts this date. Patient refusing both attempts, reports significant SOB at rest, patient SOB rated 9/10. Sp02 at 94% when PT present. Patient refusing participation, willing to re-attempt at later date. Nurse at bedside.    Densil Ottey M Fairly, PT, DPT 06/21/23 1:22 PM

## 2023-06-21 NOTE — Progress Notes (Signed)
 OT Cancellation Note  Patient Details Name: Sheri Barron MRN: [id number] DOB: [birth year]   Cancelled Treatment:    Reason Eval/Treat Not Completed: Patient declined, no reason specified. Pt declined OT/PT treatment session due to reports shortness of breathe at rest. Pt reports she is eager to attempt therapy session when feeling better. OT will re attempt next on next date/time.   Rosaria Common M.S. OTR/L  06/21/23, 1:28 PM

## 2023-06-21 NOTE — Progress Notes (Signed)
 Initial Nutrition Assessment  DOCUMENTATION CODES:   Not applicable  INTERVENTION:   -Liberalize diet to regular for widest variety of meal selections -MVI with minerals daily -Ensure Plus High Protein po BID, each supplement provides 350 kcal and 20 grams of protein  -500 mg vitamin C BID -220 gm zinc sulfate daily x 14 days -RD will draw labs for potential micronutrient deficiencies that may impede wound healing and replete as appropriate: vitamin A   NUTRITION DIAGNOSIS:   Increased nutrient needs related to wound healing as evidenced by estimated needs.  GOAL:   Patient will meet greater than or equal to 90% of their needs  MONITOR:   PO intake, Supplement acceptance  REASON FOR ASSESSMENT:   Malnutrition Screening Tool    ASSESSMENT:   Pt with medical history significant for COPD on 2 L oxygen at night, HTN, HLD, prior stroke, depression with anxiety, presenting with lethargy resulting in a fall/presyncope  Pt admitted with severe sepsis and COPD.   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   Spoke with pt at bedside. Pt reports feeling better today, but having a hard time "catching my breath" after being transferred off Bi-pap (RN confirms this). Pt took pills and water without difficulty. Pt reports she has had increased respiratory trouble for the past week ("I couldn't breathe") and intake has affected by this.   Per pt, intake not great at baseline. Pt shares she usually eats one meal per day (frozen dinner). Pt shares she drinks Ensure at home and "always has a [employer] nearby". Pt eats softer foods for ease of intake. RD offered to downgrade diet for ease of intake, however, pt prefers regular consistency diet so she can pick and choose what she feels like eating.   Reviewed wt hx; pt has experienced a 11.1% wt loss over the past 6 months, which is not significant for time  frame, however, concerning given pressure injuries and multiple cor-morbidities.   Discussed importance of good meal and supplement intake to promote healing. Pt amenable to supplements.   Medications reviewed and include lovenox , phosphorus, and effexor .   Palliative care following for goals of care.   Lab Results  Component Value Date   HGBA1C 6.2 (H) 11/28/2022   PTA DM medications are 500 mg metformin  BID.   Labs reviewed: Phos: 2.1 (on supplementation), CBGS: 150 (inpatient orders for glycemic control are none).    NUTRITION - FOCUSED PHYSICAL EXAM:  Flowsheet Row Most Recent Value  Orbital Region No depletion  Upper Arm Region Mild depletion  Thoracic and Lumbar Region No depletion  Buccal Region No depletion  Temple Region No depletion  Clavicle Bone Region No depletion  Clavicle and Acromion Bone Region No depletion  Scapular Bone Region No depletion  Dorsal Hand No depletion  Patellar Region No depletion  Anterior Thigh Region No depletion  Posterior Calf Region No depletion  Edema (RD Assessment) None  Hair Reviewed  Eyes Reviewed  Mouth Reviewed  Skin Reviewed  Nails Reviewed       Diet Order:   Diet Order             Diet regular Room service appropriate? Yes; Fluid consistency: Thin  Diet effective now                   EDUCATION NEEDS:   Education needs have been addressed  Skin:  Skin Assessment: Skin Integrity Issues: Skin Integrity Issues:: Stage III Stage III: sacrum x 2  Last BM:  06/21/23  Height:   Ht Readings from Last 1 Encounters:  06/20/23 5\' 1"  (1.549 m)    Weight:   Wt Readings from Last 1 Encounters:  06/20/23 56.6 kg    Ideal Body Weight:  47.7 kg  BMI:  Body mass index is 23.58 kg/m.  Estimated Nutritional Needs:   Kcal:  1700-1900  Protein:  90-105 grams  Fluid:  1.7-1.9 L    Herschel Lords, RD, LDN, CDCES Registered Dietitian III Certified Diabetes Care and Education Specialist If unable to reach  this RD, please use "RD Inpatient" group chat on secure chat between hours of 8am-4 pm daily

## 2023-06-21 NOTE — Progress Notes (Signed)
 Pharmacy Antibiotic Note  Sheri Barron is a 68 y.o. female admitted on 06/19/2023 with sepsis.  Pharmacy has been consulted for Vancomycin & cefepime dosing.  Today, 06/21/2023:  Day 2 of vancomycin & cefepime   WBC 17.4 > 12.0 Afebrile   SCr 0.75 today with estimated CrCl of 51.5 mL/min  Renal function now at baseline  Blood cultures with no growth < 12 hours   Plan: Given improvement in renal function, adjust vancomycin to 1000 mg IV Q24H  Goal AUC 400-550 Estimated AUC 520.6 Estimated Cmin 12.3 SCr used 0.8  IBW, Vd 0.72  Continue cefepime 2 gm IV Q12H based on current renal function  Also receiving metronidazole 500 mg IV Q12H  F/u blood cultures and ability to de-escalate antibiotics if no clear source of infection identified   Height: 5\' 1"  (154.9 cm) Weight: 56.6 kg (124 lb 12.5 oz) IBW/kg (Calculated) : 47.8  Temp (24hrs), Avg:97.9 F (36.6 C), Min:97.6 F (36.4 C), Max:98 F (36.7 C)  Recent Labs  Lab 06/19/23 2255 06/19/23 2306 06/20/23 0334 06/20/23 1310 06/21/23 0519  WBC 17.4*  --   --   --  12.0*  CREATININE 1.18*  --   --  0.93 0.75  LATICACIDVEN  --  2.2* 1.1  --   --     Estimated Creatinine Clearance: 51.5 mL/min (by C-G formula based on SCr of 0.75 mg/dL).    Allergies  Allergen Reactions   Sulfa Antibiotics Nausea Only    Patient states she gets dehydrated with sulfa as well.   Sulfasalazine Nausea Only    Patient states she gets dehydrated with sulfa as well.   Antimicrobials this admission: 6/9 cefepime >>  6/9 metronidazole >>  6/9 vancomycin >>   Dose adjustments this admission: 6/10 vancomycin 1250 Q48H > 1000 Q24H  Microbiology results: 6/9 BCx: NG < 12 hours 6/9 MRSA PCR: needs to be collected   Thank you for allowing pharmacy to be a part of this patient's care.  Pansy Bogus, PharmD Pharmacy Resident  06/21/2023 7:11 AM

## 2023-06-21 NOTE — Progress Notes (Addendum)
 1      PROGRESS NOTE    KRITIKA STUKES  ZOX:[id number] DOB: 1955/08/06 DOA: 06/19/2023 PCP: Patrina Boos, MD   Brief Narrative:  68 y.o. female with medical history significant for COPD on 2 L oxygen at night, HTN, HLD, prior stroke, depression with anxiety, presenting by EMS with lethargy resulting in a fall/presyncope   6/9: Resume venlafaxine  6/10: Palliative care consult, restarted propranolol  for tremors   Assessment & Plan:   Principal Problem:   Acute on chronic respiratory failure with hypoxia and hypercapnia (HCC) Active Problems:   COPD with acute exacerbation (HCC)   Severe sepsis (HCC)   Lactic acidosis   AKI (acute kidney injury) (HCC)   Acute metabolic encephalopathy   Fall at home, initial encounter   Diabetes mellitus without complication (HCC)   Depression with anxiety   Cellulitis of sacral region   * Acute on chronic respiratory failure with hypoxia and hypercapnia (HCC) COPD with acute bronchitis Schedule and as needed nebulizers IV steroids Antitussives Flutter valve and incentive spirometer Initially required BiPAP and now weaned off to nasal cannula oxygen     Severe sepsis (HCC) ruled out Lactic acidosis Elevated lactic acid likely related to respiratory failure  Pan CT scan without identified source Discontinue antibiotics   Fall at home, initial encounter Pan CT negative for acute injury PT /OT eval   Acute metabolic encephalopathy Possibilities include presyncope,CO2 narcosis ,sepsis, sedating meds Head CT non acute Neurologic checks with fall and aspiration precautions   AKI (acute kidney injury) (HCC) Resolved with hydration   Diabetes mellitus without complication (HCC) Sliding scale insulin  coverage   Depression with anxiety Restart buspirone  and trazodone  Resume venlafaxine  pending med verification   Benign essential tremors Continue propranolol    DVT prophylaxis: Lovenox  enoxaparin  (LOVENOX ) injection 40 mg Start:  06/20/23 0800     Code Status: DNR Family Communication: No family at bedside Disposition Plan: Possible discharge in next 1 to 2 days depending on clinical condition    Subjective:  Tremulous.  Feeling short of breath, coughing  Objective: Vitals:   06/21/23 0456 06/21/23 0737 06/21/23 1226 06/21/23 1544  BP: (!) 140/97 136/78 132/85 122/77  Pulse: 90 80 61 71  Resp: 18 19 18 18   Temp: 98 F (36.7 C) 98 F (36.7 C) 98 F (36.7 C) (!) 97.4 F (36.3 C)  TempSrc: Oral   Oral  SpO2: 100% 97% 98% 100%  Weight:      Height:       No intake or output data in the 24 hours ending 06/21/23 1618 Filed Weights   06/20/23 0023  Weight: 56.6 kg    Examination:  General exam: Appears anxious, tremulous Respiratory system: Rhonchi at bilateral bases Cardiovascular system: S1 & S2 heard, RRR. No JVD, murmurs, rubs, gallops or clicks. No pedal edema. Gastrointestinal system: Abdomen is nondistended, soft and nontender. No organomegaly or masses felt. Normal bowel sounds heard. Central nervous system: Alert and oriented. No focal neurological deficits. Extremities: Symmetric 5 x 5 power. Skin: No rashes, lesions or ulcers Psychiatry: Appears anxious    Data Reviewed: I have personally reviewed following labs and imaging studies  CBC: Recent Labs  Lab 06/19/23 2255 06/21/23 0519  WBC 17.4* 12.0*  NEUTROABS 13.9*  --   HGB 11.1* 9.1*  HCT 36.4 31.1*  MCV 87.3 90.1  PLT 301 253   Basic Metabolic Panel: Recent Labs  Lab 06/19/23 2255 06/20/23 1310 06/21/23 0519  NA 138 140 143  K 3.3*  4.0 3.5  CL 85* 92* 94*  CO2 38* 40* 38*  GLUCOSE 148* 175* 89  BUN 28* 21 18  CREATININE 1.18* 0.93 0.75  CALCIUM 8.8* 8.5* 8.8*  MG  --  1.7 2.0  PHOS  --  3.8 2.1*   GFR: Estimated Creatinine Clearance: 51.5 mL/min (by C-G formula based on SCr of 0.75 mg/dL). Liver Function Tests: Recent Labs  Lab 06/19/23 2255  AST 27  ALT 13  ALKPHOS 83  BILITOT 0.4  PROT 6.7   ALBUMIN 3.0*   No results for input(s): "LIPASE", "AMYLASE" in the last 168 hours. No results for input(s): "AMMONIA" in the last 168 hours. Coagulation Profile: Recent Labs  Lab 06/19/23 2306  INR 1.1   Cardiac Enzymes: No results for input(s): "CKTOTAL", "CKMB", "CKMBINDEX", "TROPONINI" in the last 168 hours. BNP (last 3 results) No results for input(s): "PROBNP" in the last 8760 hours. HbA1C: No results for input(s): "HGBA1C" in the last 72 hours. CBG: Recent Labs  Lab 06/19/23 2258  GLUCAP 150*   Lipid Profile: No results for input(s): "CHOL", "HDL", "LDLCALC", "TRIG", "CHOLHDL", "LDLDIRECT" in the last 72 hours. Thyroid Function Tests: No results for input(s): "TSH", "T4TOTAL", "FREET4", "T3FREE", "THYROIDAB" in the last 72 hours. Anemia Panel: No results for input(s): "VITAMINB12", "FOLATE", "FERRITIN", "TIBC", "IRON", "RETICCTPCT" in the last 72 hours. Sepsis Labs: Recent Labs  Lab 06/19/23 2306 06/20/23 0334  LATICACIDVEN 2.2* 1.1    Recent Results (from the past 240 hours)  Resp panel by RT-PCR (RSV, Flu A&B, Covid) Anterior Nasal Swab     Status: None   Collection Time: 06/19/23 10:56 PM   Specimen: Anterior Nasal Swab  Result Value Ref Range Status   SARS Coronavirus 2 by RT PCR NEGATIVE NEGATIVE Final    Comment: (NOTE) SARS-CoV-2 target nucleic acids are NOT DETECTED.  The SARS-CoV-2 RNA is generally detectable in upper respiratory specimens during the acute phase of infection. The lowest concentration of SARS-CoV-2 viral copies this assay can detect is 138 copies/mL. A negative result does not preclude SARS-Cov-2 infection and should not be used as the sole basis for treatment or other patient management decisions. A negative result may occur with  improper specimen collection/handling, submission of specimen other than nasopharyngeal swab, presence of viral mutation(s) within the areas targeted by this assay, and inadequate number of  viral copies(<138 copies/mL). A negative result must be combined with clinical observations, patient history, and epidemiological information. The expected result is Negative.  Fact Sheet for Patients:  BloggerCourse.com  Fact Sheet for Healthcare Providers:  SeriousBroker.it  This test is no t yet approved or cleared by the [place]  FDA and  has been authorized for detection and/or diagnosis of SARS-CoV-2 by FDA under an Emergency Use Authorization (EUA). This EUA will remain  in effect (meaning this test can be used) for the duration of the COVID-19 declaration under Section 564(b)(1) of the Act, 21 U.S.C.section 360bbb-3(b)(1), unless the authorization is terminated  or revoked sooner.       Influenza A by PCR NEGATIVE NEGATIVE Final   Influenza B by PCR NEGATIVE NEGATIVE Final    Comment: (NOTE) The Xpert Xpress SARS-CoV-2/FLU/RSV plus assay is intended as an aid in the diagnosis of influenza from Nasopharyngeal swab specimens and should not be used as a sole basis for treatment. Nasal washings and aspirates are unacceptable for Xpert Xpress SARS-CoV-2/FLU/RSV testing.  Fact Sheet for Patients: BloggerCourse.com  Fact Sheet for Healthcare Providers: SeriousBroker.it  This test is not yet approved  or cleared by the [place]  FDA and has been authorized for detection and/or diagnosis of SARS-CoV-2 by FDA under an Emergency Use Authorization (EUA). This EUA will remain in effect (meaning this test can be used) for the duration of the COVID-19 declaration under Section 564(b)(1) of the Act, 21 U.S.C. section 360bbb-3(b)(1), unless the authorization is terminated or revoked.     Resp Syncytial Virus by PCR NEGATIVE NEGATIVE Final    Comment: (NOTE) Fact Sheet for Patients: BloggerCourse.com  Fact Sheet for Healthcare  Providers: SeriousBroker.it  This test is not yet approved or cleared by the [place]  FDA and has been authorized for detection and/or diagnosis of SARS-CoV-2 by FDA under an Emergency Use Authorization (EUA). This EUA will remain in effect (meaning this test can be used) for the duration of the COVID-19 declaration under Section 564(b)(1) of the Act, 21 U.S.C. section 360bbb-3(b)(1), unless the authorization is terminated or revoked.  Performed at [facility], [address] [place], [place] [address]   Blood Culture (routine x 2)     Status: None (Preliminary result)   Collection Time: 06/19/23 11:56 PM   Specimen: BLOOD LEFT HAND  Result Value Ref Range Status   Specimen Description BLOOD LEFT HAND  Final   Special Requests   Final    BOTTLES DRAWN AEROBIC AND ANAEROBIC Blood Culture adequate volume   Culture   Final    NO GROWTH 1 DAY Performed at [facility], [address], [place], [place] [address]    Report Status PENDING  Incomplete  Blood Culture (routine x 2)     Status: None (Preliminary result)   Collection Time: 06/20/23 12:00 AM   Specimen: BLOOD RIGHT ARM  Result Value Ref Range Status   Specimen Description BLOOD RIGHT ARM  Final   Special Requests   Final    BOTTLES DRAWN AEROBIC AND ANAEROBIC Blood Culture adequate volume   Culture   Final    NO GROWTH 1 DAY Performed at [facility], [address], [place], [place] [address]    Report Status PENDING  Incomplete         Radiology Studies: CT HEAD WO CONTRAST ( ) Result Date: 06/20/2023 CLINICAL DATA:  Neck trauma, mental status change, fall, hypotension. Patient does not remember falling and is unsure if passed out. History of COPD wearing 2 L of oxygen at baseline. 82% on room air. Sepsis. EXAM: CT HEAD WITHOUT CONTRAST CT CERVICAL SPINE WITHOUT CONTRAST CT CHEST, ABDOMEN AND PELVIS WITHOUT CONTRAST TECHNIQUE: Contiguous axial images  were obtained from the base of the skull through the vertex without intravenous contrast. Multidetector CT imaging of the cervical spine was performed without intravenous contrast. Multiplanar CT image reconstructions were also generated. Multidetector CT imaging of the chest, abdomen and pelvis was performed following the standard protocol without IV contrast. RADIATION DOSE REDUCTION: This exam was performed according to the departmental dose-optimization program which includes automated exposure control, adjustment of the mA and/or kV according to patient size and/or use of iterative reconstruction technique. COMPARISON:  Chest radiograph 06/19/2023; CTA chest 11/28/2022; CT abdomen pelvis 04/13/2016; CT head 09/26/2006 FINDINGS: CT HEAD FINDINGS Brain: No evidence of acute infarction, hemorrhage, hydrocephalus, extra-axial collection or mass lesion/mass effect. Vascular: No hyperdense vessel or unexpected calcification. Skull: Normal. Negative for fracture or focal lesion. Sinuses/Orbits: No acute finding. Other: None. CT CERVICAL FINDINGS Alignment: No evidence of traumatic listhesis. Skull base and vertebrae: No acute fracture. Soft tissues and spinal canal: No prevertebral fluid  or swelling. No visible canal hematoma. Disc levels: Multilevel spondylosis, disc space height loss, and degenerative endplate changes greatest at C5-C6 and C6-C7 where it is moderate. Posterior disc osteophyte complex at C5-C6 and C6-C7 cause is moderate effacement of the thecal sac. Uncovertebral spurring and facet arthropathy causes multilevel neural foraminal narrowing greatest on the left at C5-C6 and bilaterally at C6-C7. Other: None. CT CHEST FINDINGS Cardiovascular: Normal heart size. No pericardial effusion. Normal caliber thoracic aorta. Mediastinum/Nodes: Trachea and esophagus are unremarkable. Right hilar lymphadenopathy. For example 1.3 cm lymph node on series 2/image 29 additional mediastinal lymphadenopathy with a 1.2  cm left paratracheal node (series 2/image 25). Left suprahilar lymphadenopathy measuring 11 mm (series 2/image 24). These are enlarged compared to 11/28/2022. Lungs/Pleura: Chronic bronchial wall thickening and bronchiolectasis. Diffuse bilateral centrilobular micro nodules and tree-in-bud opacities are progressed compared to prior. No focal pneumonia. Centrilobular and paraseptal emphysema in the upper lobes. No pleural effusion or pneumothorax. Musculoskeletal: No acute fracture. CT ABDOMEN AND PELVIS FINDINGS Hepatobiliary: Hepatic steatosis. Cholecystectomy. No acute abnormality. Pancreas: Unremarkable. Spleen: Unremarkable. Adrenals/Urinary Tract: Chronic thickening of the left adrenal gland. Unremarkable right adrenal gland. No urinary calculi or hydronephrosis. Unremarkable bladder Stomach/Bowel: Normal caliber large and small bowel. Moderate stool in the right colon. No bowel wall thickening. The appendix is not visualized. Stomach is within normal limits. Duodenal diverticulum. Vascular/Lymphatic: Aortic atherosclerosis. No enlarged abdominal or pelvic lymph nodes. Reproductive: Hysterectomy.  No adnexal mass. Other: No free intraperitoneal fluid or air. Musculoskeletal: No acute fracture. IMPRESSION: 1. No acute intracranial abnormality. 2. No acute fracture or traumatic listhesis of the cervical spine. 3. No acute traumatic abnormality in the chest, abdomen, or pelvis. 4. Progressive bronchitis/bronchiolitis compared to 11/28/2022. 5. Mediastinal and bilateral hilar lymphadenopathy, likely reactive. Continued attention on follow-up. 6. Hepatic steatosis. Aortic Atherosclerosis (ICD10-I70.0) and Emphysema (ICD10-J43.9). Electronically Signed   By: Rozell Cornet M.D.   On: 06/20/2023 00:38   CT CHEST ABDOMEN PELVIS W CONTRAST Result Date: 06/20/2023 CLINICAL DATA:  Neck trauma, mental status change, fall, hypotension. Patient does not remember falling and is unsure if passed out. History of COPD wearing  2 L of oxygen at baseline. 82% on room air. Sepsis. EXAM: CT HEAD WITHOUT CONTRAST CT CERVICAL SPINE WITHOUT CONTRAST CT CHEST, ABDOMEN AND PELVIS WITHOUT CONTRAST TECHNIQUE: Contiguous axial images were obtained from the base of the skull through the vertex without intravenous contrast. Multidetector CT imaging of the cervical spine was performed without intravenous contrast. Multiplanar CT image reconstructions were also generated. Multidetector CT imaging of the chest, abdomen and pelvis was performed following the standard protocol without IV contrast. RADIATION DOSE REDUCTION: This exam was performed according to the departmental dose-optimization program which includes automated exposure control, adjustment of the mA and/or kV according to patient size and/or use of iterative reconstruction technique. COMPARISON:  Chest radiograph 06/19/2023; CTA chest 11/28/2022; CT abdomen pelvis 04/13/2016; CT head 09/26/2006 FINDINGS: CT HEAD FINDINGS Brain: No evidence of acute infarction, hemorrhage, hydrocephalus, extra-axial collection or mass lesion/mass effect. Vascular: No hyperdense vessel or unexpected calcification. Skull: Normal. Negative for fracture or focal lesion. Sinuses/Orbits: No acute finding. Other: None. CT CERVICAL FINDINGS Alignment: No evidence of traumatic listhesis. Skull base and vertebrae: No acute fracture. Soft tissues and spinal canal: No prevertebral fluid or swelling. No visible canal hematoma. Disc levels: Multilevel spondylosis, disc space height loss, and degenerative endplate changes greatest at C5-C6 and C6-C7 where it is moderate. Posterior disc osteophyte complex at C5-C6 and C6-C7 cause is moderate effacement of  the thecal sac. Uncovertebral spurring and facet arthropathy causes multilevel neural foraminal narrowing greatest on the left at C5-C6 and bilaterally at C6-C7. Other: None. CT CHEST FINDINGS Cardiovascular: Normal heart size. No pericardial effusion. Normal caliber  thoracic aorta. Mediastinum/Nodes: Trachea and esophagus are unremarkable. Right hilar lymphadenopathy. For example 1.3 cm lymph node on series 2/image 29 additional mediastinal lymphadenopathy with a 1.2 cm left paratracheal node (series 2/image 25). Left suprahilar lymphadenopathy measuring 11 mm (series 2/image 24). These are enlarged compared to 11/28/2022. Lungs/Pleura: Chronic bronchial wall thickening and bronchiolectasis. Diffuse bilateral centrilobular micro nodules and tree-in-bud opacities are progressed compared to prior. No focal pneumonia. Centrilobular and paraseptal emphysema in the upper lobes. No pleural effusion or pneumothorax. Musculoskeletal: No acute fracture. CT ABDOMEN AND PELVIS FINDINGS Hepatobiliary: Hepatic steatosis. Cholecystectomy. No acute abnormality. Pancreas: Unremarkable. Spleen: Unremarkable. Adrenals/Urinary Tract: Chronic thickening of the left adrenal gland. Unremarkable right adrenal gland. No urinary calculi or hydronephrosis. Unremarkable bladder Stomach/Bowel: Normal caliber large and small bowel. Moderate stool in the right colon. No bowel wall thickening. The appendix is not visualized. Stomach is within normal limits. Duodenal diverticulum. Vascular/Lymphatic: Aortic atherosclerosis. No enlarged abdominal or pelvic lymph nodes. Reproductive: Hysterectomy.  No adnexal mass. Other: No free intraperitoneal fluid or air. Musculoskeletal: No acute fracture. IMPRESSION: 1. No acute intracranial abnormality. 2. No acute fracture or traumatic listhesis of the cervical spine. 3. No acute traumatic abnormality in the chest, abdomen, or pelvis. 4. Progressive bronchitis/bronchiolitis compared to 11/28/2022. 5. Mediastinal and bilateral hilar lymphadenopathy, likely reactive. Continued attention on follow-up. 6. Hepatic steatosis. Aortic Atherosclerosis (ICD10-I70.0) and Emphysema (ICD10-J43.9). Electronically Signed   By: Rozell Cornet M.D.   On: 06/20/2023 00:38   CT  Cervical Spine Wo Contrast Result Date: 06/20/2023 CLINICAL DATA:  Neck trauma, mental status change, fall, hypotension. Patient does not remember falling and is unsure if passed out. History of COPD wearing 2 L of oxygen at baseline. 82% on room air. Sepsis. EXAM: CT HEAD WITHOUT CONTRAST CT CERVICAL SPINE WITHOUT CONTRAST CT CHEST, ABDOMEN AND PELVIS WITHOUT CONTRAST TECHNIQUE: Contiguous axial images were obtained from the base of the skull through the vertex without intravenous contrast. Multidetector CT imaging of the cervical spine was performed without intravenous contrast. Multiplanar CT image reconstructions were also generated. Multidetector CT imaging of the chest, abdomen and pelvis was performed following the standard protocol without IV contrast. RADIATION DOSE REDUCTION: This exam was performed according to the departmental dose-optimization program which includes automated exposure control, adjustment of the mA and/or kV according to patient size and/or use of iterative reconstruction technique. COMPARISON:  Chest radiograph 06/19/2023; CTA chest 11/28/2022; CT abdomen pelvis 04/13/2016; CT head 09/26/2006 FINDINGS: CT HEAD FINDINGS Brain: No evidence of acute infarction, hemorrhage, hydrocephalus, extra-axial collection or mass lesion/mass effect. Vascular: No hyperdense vessel or unexpected calcification. Skull: Normal. Negative for fracture or focal lesion. Sinuses/Orbits: No acute finding. Other: None. CT CERVICAL FINDINGS Alignment: No evidence of traumatic listhesis. Skull base and vertebrae: No acute fracture. Soft tissues and spinal canal: No prevertebral fluid or swelling. No visible canal hematoma. Disc levels: Multilevel spondylosis, disc space height loss, and degenerative endplate changes greatest at C5-C6 and C6-C7 where it is moderate. Posterior disc osteophyte complex at C5-C6 and C6-C7 cause is moderate effacement of the thecal sac. Uncovertebral spurring and facet arthropathy causes  multilevel neural foraminal narrowing greatest on the left at C5-C6 and bilaterally at C6-C7. Other: None. CT CHEST FINDINGS Cardiovascular: Normal heart size. No pericardial effusion. Normal caliber thoracic aorta. Mediastinum/Nodes:  Trachea and esophagus are unremarkable. Right hilar lymphadenopathy. For example 1.3 cm lymph node on series 2/image 29 additional mediastinal lymphadenopathy with a 1.2 cm left paratracheal node (series 2/image 25). Left suprahilar lymphadenopathy measuring 11 mm (series 2/image 24). These are enlarged compared to 11/28/2022. Lungs/Pleura: Chronic bronchial wall thickening and bronchiolectasis. Diffuse bilateral centrilobular micro nodules and tree-in-bud opacities are progressed compared to prior. No focal pneumonia. Centrilobular and paraseptal emphysema in the upper lobes. No pleural effusion or pneumothorax. Musculoskeletal: No acute fracture. CT ABDOMEN AND PELVIS FINDINGS Hepatobiliary: Hepatic steatosis. Cholecystectomy. No acute abnormality. Pancreas: Unremarkable. Spleen: Unremarkable. Adrenals/Urinary Tract: Chronic thickening of the left adrenal gland. Unremarkable right adrenal gland. No urinary calculi or hydronephrosis. Unremarkable bladder Stomach/Bowel: Normal caliber large and small bowel. Moderate stool in the right colon. No bowel wall thickening. The appendix is not visualized. Stomach is within normal limits. Duodenal diverticulum. Vascular/Lymphatic: Aortic atherosclerosis. No enlarged abdominal or pelvic lymph nodes. Reproductive: Hysterectomy.  No adnexal mass. Other: No free intraperitoneal fluid or air. Musculoskeletal: No acute fracture. IMPRESSION: 1. No acute intracranial abnormality. 2. No acute fracture or traumatic listhesis of the cervical spine. 3. No acute traumatic abnormality in the chest, abdomen, or pelvis. 4. Progressive bronchitis/bronchiolitis compared to 11/28/2022. 5. Mediastinal and bilateral hilar lymphadenopathy, likely reactive.  Continued attention on follow-up. 6. Hepatic steatosis. Aortic Atherosclerosis (ICD10-I70.0) and Emphysema (ICD10-J43.9). Electronically Signed   By: Rozell Cornet M.D.   On: 06/20/2023 00:38   DG Chest Portable 1 View Result Date: 06/19/2023 CLINICAL DATA:  Shortness of breath EXAM: PORTABLE CHEST 1 VIEW COMPARISON:  11/28/2022 FINDINGS: Cardiac shadow is stable. The lungs are well aerated bilaterally. Mild central vascular congestion is noted without significant edema. No bony abnormality is noted. IMPRESSION: Mild vascular congestion without significant edema. Electronically Signed   By: Violeta Grey M.D.   On: 06/19/2023 23:09        Scheduled Meds:  vitamin C  500 mg Oral BID   aspirin  EC  81 mg Oral Daily   enoxaparin  (LOVENOX ) injection  40 mg Subcutaneous Q24H   feeding supplement  237 mL Oral BID BM   fluticasone   1 spray Each Nare BID   fluticasone  furoate-vilanterol  1 puff Inhalation Daily   multivitamin with minerals  1 tablet Oral Daily   propranolol   80 mg Oral BID   umeclidinium bromide  1 puff Inhalation Daily   venlafaxine  XR  150 mg Oral Q breakfast   zinc sulfate (50mg  elemental zinc)  220 mg Oral Daily   Continuous Infusions:  ceFEPime (MAXIPIME) IV 2 g (06/21/23 1221)   metronidazole 500 mg (06/21/23 1322)   vancomycin 1,000 mg (06/21/23 0829)     LOS: 1 day    Time spent: 35 minutes    Armand Preast Mason Sole, MD [facility] Pager [phone]  If 7PM-7AM, please contact night-coverage [email or web address]  06/21/2023, 4:18 PM

## 2023-06-21 NOTE — TOC Initial Note (Signed)
 Transition of Care [facility]) - Initial/Assessment Note    Patient Details  Name: Sheri Barron MRN: [id number] Date of Birth: [birth year]  Transition of Care [facility]) CM/SW Contact:    Crayton Docker, RN 06/21/2023, 11:23 AM  Clinical Narrative:                    CM to patient's room regarding TOC screening assessment. CM explained case management role and discharge care planning process. Patient amenable to screening assessment. Patient lives alone, no stairs. Per patient uses oxygen through [employer], 2LPM nocturnal and PRN.  Per patient comment, portable concentrator will not hold a charge to provide oxygen.   OT recommendations are SNF CM awaiting PT recommendations.   Per patient, declined SNF and prefers home health. CM will alert Dr. Mason Sole to patient preference.  Barriers to Discharge: Continued Medical Work up   Patient Goals and CMS Choice    Home/self care   Expected Discharge Plan and Services    SNF per OT recommendations, CM is awaiting PT recommendations   Living arrangements for the past 2 months: Single Family Home       Prior Living Arrangements/Services Living arrangements for the past 2 months: Single Family Home Lives with:: Self Patient language and need for interpreter reviewed:: No Do you feel safe going back to the place where you live?: Yes      Need for Family Participation in Patient Care: Yes (Comment) Care giver support system in place?: Yes (comment) Current home services: DME Criminal Activity/Legal Involvement Pertinent to Current Situation/Hospitalization: No - Comment as needed  Activities of Daily Living   ADL Screening (condition at time of admission) Independently performs ADLs?: No Does the patient have a NEW difficulty with bathing/dressing/toileting/self-feeding that is expected to last >3 days?: No Does the patient have a NEW difficulty with getting in/out of bed, walking, or climbing stairs that is expected to last >3 days?: Yes  (Initiates electronic notice to provider for possible PT consult) Does the patient have a NEW difficulty with communication that is expected to last >3 days?: No Is the patient deaf or have difficulty hearing?: No Does the patient have difficulty seeing, even when wearing glasses/contacts?: No Does the patient have difficulty concentrating, remembering, or making decisions?: No  Permission Sought/Granted Permission sought to share information with : Case Manager, Family Supports Permission granted to share information with : Yes, Verbal Permission Granted  Share Information with NAME: Nicholes Barks, daughter     Permission granted to share info w Relationship: Daughter  Permission granted to share info w Contact Information: Yes  Emotional Assessment Appearance:: Appears older than stated age Attitude/Demeanor/Rapport: Engaged Affect (typically observed): Calm Orientation: : Oriented to Self, Oriented to Place, Oriented to  Time, Oriented to Situation Alcohol  / Substance Use: Tobacco Use (per patient comment, .5 pack daily, last cigarette is 06/17/2023)    Admission diagnosis:  COPD with acute exacerbation (HCC) [J44.1] Cellulitis of sacral region [L03.319] Acute on chronic respiratory failure with hypoxia (HCC) [J96.21] Acute on chronic respiratory failure with hypoxia and hypercapnia (HCC) [N82.95, J96.22] Acute sepsis [facility]) [A41.9] Patient Active Problem List   Diagnosis Date Noted   AKI (acute kidney injury) (HCC) 06/20/2023   Acute metabolic encephalopathy 06/20/2023   Lactic acidosis 06/20/2023   Fall at home, initial encounter 06/20/2023   Cellulitis of sacral region 06/20/2023   Infection due to parainfluenza virus 1 11/30/2022   COPD with acute exacerbation (HCC) 11/28/2022   CAP (community acquired pneumonia)  11/28/2022   Severe sepsis (HCC) 11/28/2022   HTN (hypertension) 11/28/2022   Diabetes mellitus without complication (HCC) 11/28/2022   Stroke (HCC) 11/28/2022    Depression with anxiety 11/28/2022   Tobacco abuse 11/28/2022   Acute on chronic respiratory failure with hypoxia and hypercapnia (HCC) 11/28/2022   Irritable bowel syndrome without diarrhea 05/17/2016   Hypokalemia 11/14/2015   Essential tremor 11/02/2013   PCP:  Patrina Boos, MD Pharmacy:   [facility] - [place], [place] - [address] [place] [address] Phone: [phone] Fax: [phone]  [facility] - [place], [place] - [address] [place] [address] Phone: [phone] Fax: [phone]     Social Drivers of Health (SDOH) Social History: SDOH Screenings   Food Insecurity: Food Insecurity Present (06/20/2023)  Housing: Low Risk  (06/20/2023)  Transportation Needs: Unmet Transportation Needs (06/20/2023)  Utilities: Not At Risk (06/20/2023)  Social Connections: Patient Declined (06/20/2023)  Tobacco Use: High Risk (06/20/2023)   SDOH Interventions:     Readmission Risk Interventions     No data to display

## 2023-06-21 NOTE — Plan of Care (Signed)
 Tamea Faith, LPN

## 2023-06-21 NOTE — Consult Note (Signed)
 WOC team consulted on sacral wound; see previous consult by [facility] RN 06/20/2023.  Orders in place.   Thank you,    Ronni Colace MSN, RN-BC, [employer]

## 2023-06-21 NOTE — Consult Note (Deleted)
 WOC Nurse Consult Note: Reason for Consult: Stage 3 Pressure Injury Sacrum  Wound type: Unstageable Pressure Injury sacrum (3 open areas separated by intact skin)  Pressure Injury POA: Yes Measurement: see nursing flowsheet  Wound bed: 100% tan yellow slough  Drainage (amount, consistency, odor) see nursing flowsheet  Periwound: erythema  Dressing procedure/placement/frequency: Cleanse sacral wounds with NS, apply Medihoney to wound beds daily, cover with dry gauze and secure with silicone foam.    POC discussed with bedside nurse. WOC tam will not follow. Re-consult if further needs arise.   Thank you,    Ronni Colace MSN, RN-BC, [employer]

## 2023-06-21 NOTE — Consult Note (Signed)
 PHARMACY CONSULT NOTE - ELECTROLYTES  Pharmacy Consult for Electrolyte Monitoring and Replacement   Recent Labs: Height: 5\' 1"  (154.9 cm) Weight: 56.6 kg (124 lb 12.5 oz) IBW/kg (Calculated) : 47.8 Estimated Creatinine Clearance: 51.5 mL/min (by C-G formula based on SCr of 0.75 mg/dL).  Potassium (mmol/L)  Date Value  06/21/2023 3.5  10/18/2013 3.6   Magnesium  (mg/dL)  Date Value  28/41/3244 2.0  09/05/2013 1.9   Calcium (mg/dL)  Date Value  01/13/7251 8.8 (L)   Calcium, Total (mg/dL)  Date Value  66/44/0347 8.3 (L)   Albumin (g/dL)  Date Value  42/59/5638 3.0 (L)  10/18/2013 3.4   Phosphorus (mg/dL)  Date Value  75/64/3329 2.1 (L)   Sodium (mmol/L)  Date Value  06/21/2023 143  10/18/2013 141   Corrected Ca: 9.3 mg/dL  Assessment  Sheri Barron is a 68 y.o. female presenting with lethargy resulting in a a fall/presyncope. PMH significant for COPD on 2 L oxygen at night, HTN, HLD, prior stroke, & depression with anxiety. Pharmacy has been consulted to monitor and replace electrolytes.  Diet: PO  MIVF: none Pertinent medications: none   Goal of Therapy: Electrolytes WNL  Plan:   Phos 2.1 - will order K Phos neutral 250 mg x 2 doses  No other electrolyte replacement indicated at this time  Check BMP, Mg, Phos with AM labs  Thank you for allowing pharmacy to be a part of this patient's care.  Pansy Bogus, PharmD Pharmacy Resident  06/21/2023 7:02 AM

## 2023-06-22 DIAGNOSIS — Y92009 Unspecified place in unspecified non-institutional (private) residence as the place of occurrence of the external cause: Secondary | ICD-10-CM

## 2023-06-22 DIAGNOSIS — A419 Sepsis, unspecified organism: Secondary | ICD-10-CM | POA: Diagnosis not present

## 2023-06-22 DIAGNOSIS — N179 Acute kidney failure, unspecified: Secondary | ICD-10-CM

## 2023-06-22 DIAGNOSIS — R6520 Severe sepsis without septic shock: Secondary | ICD-10-CM

## 2023-06-22 DIAGNOSIS — W19XXXA Unspecified fall, initial encounter: Secondary | ICD-10-CM

## 2023-06-22 DIAGNOSIS — F418 Other specified anxiety disorders: Secondary | ICD-10-CM

## 2023-06-22 DIAGNOSIS — E119 Type 2 diabetes mellitus without complications: Secondary | ICD-10-CM

## 2023-06-22 DIAGNOSIS — E872 Acidosis, unspecified: Secondary | ICD-10-CM | POA: Diagnosis not present

## 2023-06-22 DIAGNOSIS — J441 Chronic obstructive pulmonary disease with (acute) exacerbation: Secondary | ICD-10-CM | POA: Diagnosis not present

## 2023-06-22 DIAGNOSIS — J9621 Acute and chronic respiratory failure with hypoxia: Secondary | ICD-10-CM | POA: Diagnosis not present

## 2023-06-22 LAB — CBC
HCT: 31.2 % — ABNORMAL LOW (ref 36.0–46.0)
Hemoglobin: 9.4 g/dL — ABNORMAL LOW (ref 12.0–15.0)
MCH: 27 pg (ref 26.0–34.0)
MCHC: 30.1 g/dL (ref 30.0–36.0)
MCV: 89.7 fL (ref 80.0–100.0)
Platelets: 244 10*3/uL (ref 150–400)
RBC: 3.48 MIL/uL — ABNORMAL LOW (ref 3.87–5.11)
RDW: 14.6 % (ref 11.5–15.5)
WBC: 9.2 10*3/uL (ref 4.0–10.5)
nRBC: 0 % (ref 0.0–0.2)

## 2023-06-22 LAB — PHOSPHORUS: Phosphorus: 3.2 mg/dL (ref 2.5–4.6)

## 2023-06-22 LAB — BASIC METABOLIC PANEL WITH GFR
Anion gap: 13 (ref 5–15)
BUN: 17 mg/dL (ref 8–23)
CO2: 38 mmol/L — ABNORMAL HIGH (ref 22–32)
Calcium: 8.9 mg/dL (ref 8.9–10.3)
Chloride: 89 mmol/L — ABNORMAL LOW (ref 98–111)
Creatinine, Ser: 0.72 mg/dL (ref 0.44–1.00)
GFR, Estimated: >60 mL/min (ref >60)
Glucose, Bld: 98 mg/dL (ref 70–99)
Potassium: 3.9 mmol/L (ref 3.5–5.1)
Sodium: 140 mmol/L (ref 135–145)

## 2023-06-22 LAB — MAGNESIUM: Magnesium: 1.6 mg/dL — ABNORMAL LOW (ref 1.7–2.4)

## 2023-06-22 MED ORDER — PANTOPRAZOLE SODIUM 40 MG PO TBEC
40.0000 mg | DELAYED_RELEASE_TABLET | Freq: Every day | ORAL | Status: DC
  Administered 2023-06-23 – 2023-06-24 (×2): 40 mg via ORAL
  Filled 2023-06-22 (×2): qty 1

## 2023-06-22 MED ORDER — LIDOCAINE 5 % EX PTCH: 1.0000 | MEDICATED_PATCH | Freq: Every day | CUTANEOUS | Status: DC | PRN

## 2023-06-22 MED ORDER — HYDROCHLOROTHIAZIDE 12.5 MG PO TABS
12.5000 mg | ORAL_TABLET | Freq: Every day | ORAL | Status: DC
  Administered 2023-06-23 – 2023-06-24 (×2): 12.5 mg via ORAL
  Filled 2023-06-22 (×2): qty 1

## 2023-06-22 MED ORDER — MAGNESIUM SULFATE 2 GM/50ML IV SOLN
2.0000 g | Freq: Once | INTRAVENOUS | Status: AC
  Administered 2023-06-22: 2 g via INTRAVENOUS
  Filled 2023-06-22: qty 50

## 2023-06-22 MED ORDER — GUAIFENESIN ER 600 MG PO TB12
600.0000 mg | ORAL_TABLET | Freq: Two times a day (BID) | ORAL | Status: DC
  Administered 2023-06-22 – 2023-06-24 (×5): 600 mg via ORAL
  Filled 2023-06-22 (×5): qty 1

## 2023-06-22 MED ORDER — VENLAFAXINE HCL ER 75 MG PO CP24: 150.0000 mg | ORAL_CAPSULE | Freq: Every day | ORAL | Status: DC

## 2023-06-22 MED ORDER — TIOTROPIUM BROMIDE MONOHYDRATE 18 MCG IN CAPS: 18.0000 ug | ORAL_CAPSULE | Freq: Every day | RESPIRATORY_TRACT | Status: DC

## 2023-06-22 MED ORDER — LINACLOTIDE 290 MCG PO CAPS
290.0000 ug | ORAL_CAPSULE | Freq: Every day | ORAL | Status: DC
  Filled 2023-06-22: qty 1

## 2023-06-22 MED ORDER — PROPRANOLOL HCL 40 MG PO TABS
80.0000 mg | ORAL_TABLET | Freq: Two times a day (BID) | ORAL | Status: DC
  Filled 2023-06-22: qty 2

## 2023-06-22 MED ORDER — PROPRANOLOL HCL 40 MG PO TABS
40.0000 mg | ORAL_TABLET | Freq: Two times a day (BID) | ORAL | Status: DC
  Administered 2023-06-22: 40 mg via ORAL
  Filled 2023-06-22: qty 1

## 2023-06-22 MED ORDER — METHYLPREDNISOLONE SODIUM SUCC 40 MG IJ SOLR
40.0000 mg | Freq: Every day | INTRAMUSCULAR | Status: DC
  Administered 2023-06-22 – 2023-06-24 (×3): 40 mg via INTRAVENOUS
  Filled 2023-06-22 (×4): qty 1

## 2023-06-22 MED ORDER — TRAZODONE HCL 50 MG PO TABS
300.0000 mg | ORAL_TABLET | Freq: Every day | ORAL | Status: DC
  Administered 2023-06-22 – 2023-06-23 (×2): 300 mg via ORAL
  Filled 2023-06-22 (×2): qty 6

## 2023-06-22 MED ORDER — BACLOFEN 10 MG PO TABS
10.0000 mg | ORAL_TABLET | Freq: Three times a day (TID) | ORAL | Status: DC
  Administered 2023-06-23 – 2023-06-24 (×4): 10 mg via ORAL
  Filled 2023-06-22 (×4): qty 1

## 2023-06-22 MED ORDER — LISINOPRIL 10 MG PO TABS
10.0000 mg | ORAL_TABLET | Freq: Every day | ORAL | Status: DC
  Administered 2023-06-23 – 2023-06-24 (×2): 10 mg via ORAL
  Filled 2023-06-22 (×2): qty 1

## 2023-06-22 MED ORDER — LISINOPRIL-HYDROCHLOROTHIAZIDE 10-12.5 MG PO TABS: 1.0000 | ORAL_TABLET | Freq: Every day | ORAL | Status: DC

## 2023-06-22 NOTE — Care Management Important Message (Signed)
 Important Message  Patient Details  Name: Sheri Barron MRN: [id number] Date of Birth: Nov 11, 1955   Important Message Given:  Yes - Medicare IM     Anise Kerns 06/22/2023, 3:19 PM

## 2023-06-22 NOTE — Plan of Care (Signed)
  Problem: Fluid Volume: Goal: Hemodynamic stability will improve Outcome: Progressing   Problem: Clinical Measurements: Goal: Diagnostic test results will improve Outcome: Progressing Goal: Signs and symptoms of infection will decrease Outcome: Progressing   Problem: Respiratory: Goal: Ability to maintain adequate ventilation will improve Outcome: Progressing   Problem: Education: Goal: Knowledge of General Education information will improve Description: Including pain rating scale, medication(s)/side effects and non-pharmacologic comfort measures Outcome: Progressing   Problem: Health Behavior/Discharge Planning: Goal: Ability to manage health-related needs will improve Outcome: Progressing   Problem: Clinical Measurements: Goal: Ability to maintain clinical measurements within normal limits will improve Outcome: Progressing Goal: Will remain free from infection Outcome: Progressing Goal: Diagnostic test results will improve Outcome: Progressing Goal: Respiratory complications will improve Outcome: Progressing Goal: Cardiovascular complication will be avoided Outcome: Progressing   Problem: Activity: Goal: Risk for activity intolerance will decrease Outcome: Progressing   Problem: Nutrition: Goal: Adequate nutrition will be maintained Outcome: Progressing   Problem: Coping: Goal: Level of anxiety will decrease Outcome: Progressing   Problem: Elimination: Goal: Will not experience complications related to bowel motility Outcome: Progressing Goal: Will not experience complications related to urinary retention Outcome: Progressing   Problem: Pain Managment: Goal: General experience of comfort will improve and/or be controlled Outcome: Progressing   Problem: Safety: Goal: Ability to remain free from injury will improve Outcome: Progressing   Problem: Skin Integrity: Goal: Risk for impaired skin integrity will decrease Outcome: Progressing   Problem:  Education: Goal: Knowledge of General Education information will improve Description: Including pain rating scale, medication(s)/side effects and non-pharmacologic comfort measures Outcome: Progressing

## 2023-06-22 NOTE — Progress Notes (Signed)
 Occupational Therapy Treatment Patient Details Name: Sheri Barron MRN: [id number] DOB: November 22, 1955 Today's Date: 06/22/2023   History of present illness Pt is a 68 y.o. female presenting by EMS with lethargy resulting in a fall/presyncope. EMS O2 sat 82% on room air and blood pressure in the 70s. Admitted for management of Acute on chronic respiratory failure with hypoxia and hypercapnia, COPD with acute bronchitis, severe sepsis, lactic acidosis and acute metabolic encephalopathy. PMH of COPD on 2 L oxygen at night, HTN, HLD, prior stroke, depression with anxiety.   OT comments  Pt seen for OT treatment on this date. Upon arrival to room pt semi supine in bed, agreeable to tx with little encouragement. Pt completed bed mobility with MODI, donned bilateral socks while seated on the EOB with set up assistance. Pt STS from the EOB with CGA and amb within room with RW, required cues for DME management, pt is used to [facility] at baseline. Pt amb to the chair with +1 HHA, noted improved stability and improved posture when compared to amb with RW. 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 Pt was on 3L of oxygen throughout session, remained <90% during mobility. Pt remains needing rest breaks due to fatigue and feelings of tight throat. Pt making good progress toward goals, will continue to follow POC. Discharge recommendation remains appropriate.        If plan is discharge home, recommend the following:  A little help with walking and/or transfers;A lot of help with bathing/dressing/bathroom   Equipment Recommendations  BSC/3in1    Recommendations for Other Services      Precautions / Restrictions Precautions Precautions: Fall Recall of Precautions/Restrictions: Intact Restrictions Weight Bearing Restrictions Per Provider Order: No       Mobility Bed Mobility Overal bed mobility: Modified Independent             General bed mobility comments: Noted very effortful, requring  extra time to recover from fatigue, VSS.    Transfers Overall transfer level: Needs assistance Equipment used: None Transfers: Sit to/from Stand, Bed to chair/wheelchair/BSC Sit to Stand: Contact guard assist Stand pivot transfers: Contact guard assist         General transfer comment: Verbal cues for DME management during mobility, to keep within the RW.     Balance Overall balance assessment: Needs assistance Sitting-balance support: Feet supported, No upper extremity supported Sitting balance-Leahy Scale: Good     Standing balance support: During functional activity, Reliant on assistive device for balance, Bilateral upper extremity supported Standing balance-Leahy Scale: Fair                             ADL either performed or assessed with clinical judgement   ADL Overall ADL's : Needs assistance/impaired                 Upper Body Dressing : Minimal assistance;Sitting Upper Body Dressing Details (indicate cue type and reason): Don/doff gown Lower Body Dressing: Set up Lower Body Dressing Details (indicate cue type and reason): Figure 4 method to don socks while sitting on the EOB Toilet Transfer: Ambulation;Rolling walker (2 wheels);Contact guard assist (Simulated toilet t/f)           Functional mobility during ADLs: Contact guard assist;Rolling walker (2 wheels)      Extremity/Trunk Assessment Upper Extremity Assessment Upper Extremity Assessment: Defer to OT evaluation   Lower Extremity Assessment Lower Extremity Assessment: Generalized weakness  Vision       Perception     Praxis     Communication Communication Communication: No apparent difficulties   Cognition Arousal: Alert Behavior During Therapy: WFL for tasks assessed/performed               OT - Cognition Comments: A/Ox4                 Following commands: Intact        Cueing   Cueing Techniques: Verbal cues, Gestural cues  Exercises  Exercises: Other exercises Other Exercises Other Exercises: Edu: Pursed lips breathing, fall prevention, DME management, self awareness    Shoulder Instructions       General Comments Pt was on 3L oxygen via [place] throughout session; Sp02 levels maintain 94% during mobility    Pertinent Vitals/ Pain          Home Living Family/patient expects to be discharged to:: Private residence Living Arrangements: Alone Available Help at Discharge: Family;Available PRN/intermittently;Friend(s) Type of Home: Mobile home Home Access: Ramped entrance     Home Layout: One level     Bathroom Shower/Tub: Chief Strategy Officer: Standard Bathroom Accessibility: No       Additional Comments: Reports has friend/family that can provide help if needed. Endorses multiple canes throughout the house.      Prior Functioning/Environment              Frequency  Min 2X/week        Progress Toward Goals  OT Goals(current goals can now be found in the care plan section)  Progress towards OT goals: Progressing toward goals  Acute Rehab OT Goals OT Goal Formulation: With patient/family Time For Goal Achievement: 07/04/23 Potential to Achieve Goals: Good ADL Goals Pt Will Perform Lower Body Dressing: with supervision;sitting/lateral leans;sit to/from stand Pt Will Transfer to Toilet: with supervision;regular height toilet;bedside commode;ambulating Pt Will Perform Toileting - Clothing Manipulation and hygiene: sitting/lateral leans;with modified independence Additional ADL Goal #1: Pt will demo implementation of 1 learned ECS during ADL performance 2/2 trials to prevent overexertion.  Plan      Co-evaluation                 AM-PAC OT 6 Clicks Daily Activity     Outcome Measure   Help from another person eating meals?: None Help from another person taking care of personal grooming?: None Help from another person toileting, which includes using toliet, bedpan, or  urinal?: A Little Help from another person bathing (including washing, rinsing, drying)?: A Lot Help from another person to put on and taking off regular upper body clothing?: A Little Help from another person to put on and taking off regular lower body clothing?: A Little 6 Click Score: 19    End of Session Equipment Utilized During Treatment: Oxygen;Gait belt;Rolling walker (2 wheels)  OT Visit Diagnosis: Other abnormalities of gait and mobility (R26.89);Muscle weakness (generalized) (M62.81)   Activity Tolerance Patient tolerated treatment well   Patient Left with call bell/phone within reach;in chair;with chair alarm set   Nurse Communication Mobility status        Time: 5638-7564 OT Time Calculation (min): 18 min  Charges: OT General Charges $OT Visit: 1 Visit OT Treatments $Self Care/Home Management : 8-22 mins  Rosaria Common M.S. OTR/L  06/22/23, 1:43 PM

## 2023-06-22 NOTE — Consult Note (Signed)
 PHARMACY CONSULT NOTE - ELECTROLYTES  Pharmacy Consult for Electrolyte Monitoring and Replacement   Recent Labs: Height: 5' 1 (154.9 cm) Weight: 56.6 kg (124 lb 12.5 oz) IBW/kg (Calculated) : 47.8 Estimated Creatinine Clearance: 51.5 mL/min (by C-G formula based on SCr of 0.72 mg/dL).  Potassium (mmol/L)  Date Value  06/22/2023 3.9  10/18/2013 3.6   Magnesium  (mg/dL)  Date Value  81/19/1478 1.6 (L)  09/05/2013 1.9   Calcium (mg/dL)  Date Value  29/56/2130 8.9   Calcium, Total (mg/dL)  Date Value  86/57/8469 8.3 (L)   Albumin (g/dL)  Date Value  62/95/2841 3.0 (L)  10/18/2013 3.4   Phosphorus (mg/dL)  Date Value  32/44/0102 3.2   Sodium (mmol/L)  Date Value  06/22/2023 140  10/18/2013 141   Corrected Ca: 9.3 mg/dL  Assessment  Sheri Barron is a 68 y.o. female presenting with lethargy resulting in a a fall/presyncope. PMH significant for COPD on 2 L oxygen at night, HTN, HLD, prior stroke, & depression with anxiety. Pharmacy has been consulted to monitor and replace electrolytes.  Diet: PO  MIVF: none Pertinent medications: none   Goal of Therapy: Electrolytes WNL  Plan:   Mag 1.6 - will order magnesium  2 gm IV x 1  No other electrolyte replacement indicated at this time  Check BMP and Mg with AM labs  Thank you for allowing pharmacy to be a part of this patient's care.  Pansy Bogus, PharmD Pharmacy Resident  06/22/2023 7:02 AM

## 2023-06-22 NOTE — Progress Notes (Signed)
 PROGRESS NOTE    Sheri Barron  ZOX:[id number] DOB: 1955/11/20 DOA: 06/19/2023 PCP: Patrina Boos, MD  No chief complaint on file.   Hospital Course:  Sheri Barron is a 68 y.o. female with COPD chronically on 2 L O2 at night, hypertension, hyperlipidemia, prior CVA, depression, anxiety, who presented via EMS with lethargy and presyncopal episode.  Patient was found to have acute on chronic respiratory failure with hypoxia and hypercapnia.  She was admitted for COPD exacerbation.  Subjective: Patient reports she is beginning to feel better.  She reports difficulty getting up phlegm and thus difficulty with breathing and swallowing because of this.   Objective: Vitals:   06/22/23 0153 06/22/23 0412 06/22/23 0725 06/22/23 1106  BP:  138/65 129/64 (!) 150/131  Pulse:  (!) 56 (!) 59 (!) 54  Resp:  17 18 18   Temp:  97.6 F (36.4 C) 97.8 F (36.6 C) 99 F (37.2 C)  TempSrc:  Oral Oral Oral  SpO2: 95% 100% 100% 100%  Weight:      Height:        Intake/Output Summary (Last 24 hours) at 06/22/2023 1400 Last data filed at 06/21/2023 1800 Gross per 24 hour  Intake 400 ml  Output --  Net 400 ml   Filed Weights   06/20/23 0023  Weight: 56.6 kg    Examination: General exam: Appears calm and comfortable, NAD  Respiratory system: No work of breathing, symmetric chest wall expansion, diffuse wheezing, upper airway rales.  Productive cough.  Shortened expiratory phase.  On 4 L O2 Cardiovascular system: S1 & S2 heard, RRR.  Gastrointestinal system: Abdomen is nondistended, soft and nontender.  Neuro: Alert and oriented. No focal neurological deficits. Extremities: Symmetric, expected ROM Skin: No rashes, lesions Psychiatry: Demonstrates appropriate judgement and insight. Mood & affect appropriate for situation.   Assessment & Plan:  Principal Problem:   Acute on chronic respiratory failure with hypoxia and hypercapnia (HCC) Active Problems:   COPD with acute exacerbation  (HCC)   Severe sepsis (HCC)   Lactic acidosis   AKI (acute kidney injury) (HCC)   Acute metabolic encephalopathy   Fall at home, initial encounter   Diabetes mellitus without complication (HCC)   Depression with anxiety   Cellulitis of sacral region    Acute on chronic respiratory failure with hypoxia and hypercapnia COPD with acute exacerbation - Continue with scheduled and as needed nebulizers - Patient was started on vancomycin, Flagyl, cefepime.  No indication of infection.  These have been discontinued.  Does not appear that she has received steroids.  Will initiate steroid therapy. - Continue with Mucinex  and antitussives - Ordered flutter valve and incentive spirometry - Initially required BiPAP during this admission, has now been weaned to 4 L nasal cannula.  Continue weaning.  At baseline she requires 2 L at night only. - O2 goal 89 to 92%  Severe sepsis ruled out Lactic acidosis - Lactic acid elevation secondary to respiratory failure - Pan CT without infectious source - Antibiotics discontinued  Fall at home, initial encounter - No sustained injury - PT/OT  Acute metabolic encephalopathy, resolved -On my evaluation patient is alert and oriented x 3 with no focal deficits. - Head CT without acute findings - Continue frequent reorientation and delirium precautions  AKI, resolved  Type 2 diabetes without complication - Continue sliding scale - Follow CBGs, titrate as needed  Depression Anxiety - Continue home meds  Benign essential tremors - On propranolol .  Decrease dose today given  bradycardia on telemetry - Continue telemetry monitoring for medication titration  DVT prophylaxis: Lovenox    Code Status: Limited: Do not attempt resuscitation (DNR) -DNR-LIMITED -Do Not Intubate/DNI  Disposition: Currently requiring 3 L O2.  Above her baseline.  Starting IV steroids today.  Consultants:    Procedures:    Antimicrobials:  Anti-infectives (From  admission, onward)    Start     Dose/Rate Route Frequency Ordered Stop   06/22/23 0000  vancomycin (VANCOREADY) IVPB 1250 mg/250 mL  Status:  Discontinued        1,250 mg 166.7 mL/hr over 90 Minutes Intravenous Every 48 hours 06/20/23 0233 06/21/23 0721   06/21/23 0900  vancomycin (VANCOCIN) IVPB 1000 mg/200 mL premix  Status:  Discontinued        1,000 mg 200 mL/hr over 60 Minutes Intravenous Every 24 hours 06/21/23 0721 06/21/23 1621   06/20/23 1230  ceFEPIme (MAXIPIME) 2 g in sodium chloride  0.9 % 100 mL IVPB  Status:  Discontinued        2 g 200 mL/hr over 30 Minutes Intravenous Every 12 hours 06/20/23 0237 06/21/23 1621   06/20/23 0145  metroNIDAZOLE (FLAGYL) IVPB 500 mg  Status:  Discontinued        500 mg 100 mL/hr over 60 Minutes Intravenous Every 12 hours 06/20/23 0144 06/21/23 1621   06/19/23 2330  ceFEPIme (MAXIPIME) 2 g in sodium chloride  0.9 % 100 mL IVPB        2 g 200 mL/hr over 30 Minutes Intravenous  Once 06/19/23 2325 06/20/23 0111   06/19/23 2330  metroNIDAZOLE (FLAGYL) IVPB 500 mg  Status:  Discontinued        500 mg 100 mL/hr over 60 Minutes Intravenous  Once 06/19/23 2325 06/20/23 0326   06/19/23 2330  vancomycin (VANCOCIN) IVPB 1000 mg/200 mL premix        1,000 mg 200 mL/hr over 60 Minutes Intravenous  Once 06/19/23 2325 06/20/23 0326       Data Reviewed: I have personally reviewed following labs and imaging studies CBC: Recent Labs  Lab 06/19/23 2255 06/21/23 0519 06/22/23 0547  WBC 17.4* 12.0* 9.2  NEUTROABS 13.9*  --   --   HGB 11.1* 9.1* 9.4*  HCT 36.4 31.1* 31.2*  MCV 87.3 90.1 89.7  PLT 301 253 244   Basic Metabolic Panel: Recent Labs  Lab 06/19/23 2255 06/20/23 1310 06/21/23 0519 06/22/23 0547  NA 138 140 143 140  K 3.3* 4.0 3.5 3.9  CL 85* 92* 94* 89*  CO2 38* 40* 38* 38*  GLUCOSE 148* 175* 89 98  BUN 28* 21 18 17   CREATININE 1.18* 0.93 0.75 0.72  CALCIUM 8.8* 8.5* 8.8* 8.9  MG  --  1.7 2.0 1.6*  PHOS  --  3.8 2.1* 3.2    GFR: Estimated Creatinine Clearance: 51.5 mL/min (by C-G formula based on SCr of 0.72 mg/dL). Liver Function Tests: Recent Labs  Lab 06/19/23 2255  AST 27  ALT 13  ALKPHOS 83  BILITOT 0.4  PROT 6.7  ALBUMIN 3.0*   CBG: Recent Labs  Lab 06/19/23 2258  GLUCAP 150*    Recent Results (from the past 240 hours)  Resp panel by RT-PCR (RSV, Flu A&B, Covid) Anterior Nasal Swab     Status: None   Collection Time: 06/19/23 10:56 PM   Specimen: Anterior Nasal Swab  Result Value Ref Range Status   SARS Coronavirus 2 by RT PCR NEGATIVE NEGATIVE Final    Comment: (NOTE) SARS-CoV-2 target nucleic acids  are NOT DETECTED.  The SARS-CoV-2 RNA is generally detectable in upper respiratory specimens during the acute phase of infection. The lowest concentration of SARS-CoV-2 viral copies this assay can detect is 138 copies/mL. A negative result does not preclude SARS-Cov-2 infection and should not be used as the sole basis for treatment or other patient management decisions. A negative result may occur with  improper specimen collection/handling, submission of specimen other than nasopharyngeal swab, presence of viral mutation(s) within the areas targeted by this assay, and inadequate number of viral copies(<138 copies/mL). A negative result must be combined with clinical observations, patient history, and epidemiological information. The expected result is Negative.  Fact Sheet for Patients:  BloggerCourse.com  Fact Sheet for Healthcare Providers:  SeriousBroker.it  This test is no t yet approved or cleared by the [place]  FDA and  has been authorized for detection and/or diagnosis of SARS-CoV-2 by FDA under an Emergency Use Authorization (EUA). This EUA will remain  in effect (meaning this test can be used) for the duration of the COVID-19 declaration under Section 564(b)(1) of the Act, 21 U.S.C.section 360bbb-3(b)(1), unless  the authorization is terminated  or revoked sooner.       Influenza A by PCR NEGATIVE NEGATIVE Final   Influenza B by PCR NEGATIVE NEGATIVE Final    Comment: (NOTE) The Xpert Xpress SARS-CoV-2/FLU/RSV plus assay is intended as an aid in the diagnosis of influenza from Nasopharyngeal swab specimens and should not be used as a sole basis for treatment. Nasal washings and aspirates are unacceptable for Xpert Xpress SARS-CoV-2/FLU/RSV testing.  Fact Sheet for Patients: BloggerCourse.com  Fact Sheet for Healthcare Providers: SeriousBroker.it  This test is not yet approved or cleared by the [place]  FDA and has been authorized for detection and/or diagnosis of SARS-CoV-2 by FDA under an Emergency Use Authorization (EUA). This EUA will remain in effect (meaning this test can be used) for the duration of the COVID-19 declaration under Section 564(b)(1) of the Act, 21 U.S.C. section 360bbb-3(b)(1), unless the authorization is terminated or revoked.     Resp Syncytial Virus by PCR NEGATIVE NEGATIVE Final    Comment: (NOTE) Fact Sheet for Patients: BloggerCourse.com  Fact Sheet for Healthcare Providers: SeriousBroker.it  This test is not yet approved or cleared by the [place]  FDA and has been authorized for detection and/or diagnosis of SARS-CoV-2 by FDA under an Emergency Use Authorization (EUA). This EUA will remain in effect (meaning this test can be used) for the duration of the COVID-19 declaration under Section 564(b)(1) of the Act, 21 U.S.C. section 360bbb-3(b)(1), unless the authorization is terminated or revoked.  Performed at [facility], [address] [place], [place] [address]   Blood Culture (routine x 2)     Status: None (Preliminary result)   Collection Time: 06/19/23 11:56 PM   Specimen: BLOOD LEFT HAND  Result Value Ref Range Status    Specimen Description BLOOD LEFT HAND  Final   Special Requests   Final    BOTTLES DRAWN AEROBIC AND ANAEROBIC Blood Culture adequate volume   Culture   Final    NO GROWTH 2 DAYS Performed at [facility], [address], [place], [place] [address]    Report Status PENDING  Incomplete  Blood Culture (routine x 2)     Status: None (Preliminary result)   Collection Time: 06/20/23 12:00 AM   Specimen: BLOOD RIGHT ARM  Result Value Ref Range Status   Specimen Description BLOOD RIGHT ARM  Final  Special Requests   Final    BOTTLES DRAWN AEROBIC AND ANAEROBIC Blood Culture adequate volume   Culture   Final    NO GROWTH 2 DAYS Performed at [facility], [address] [place], [place] [address]    Report Status PENDING  Incomplete  MRSA Next Gen by PCR, Nasal     Status: None   Collection Time: 06/21/23  5:58 PM   Specimen: Nasal Mucosa; Nasal Swab  Result Value Ref Range Status   MRSA by PCR Next Gen NOT DETECTED NOT DETECTED Final    Comment: (NOTE) The GeneXpert MRSA Assay (FDA approved for NASAL specimens only), is one component of a comprehensive MRSA colonization surveillance program. It is not intended to diagnose MRSA infection nor to guide or monitor treatment for MRSA infections. Test performance is not FDA approved in patients less than 62 years old. Performed at [facility], [address], [place], [place] [address]      Radiology Studies: No results found.  Scheduled Meds:  vitamin C  500 mg Oral BID   aspirin  EC  81 mg Oral Daily   busPIRone   15 mg Oral BID   enoxaparin  (LOVENOX ) injection  40 mg Subcutaneous Q24H   feeding supplement  237 mL Oral BID BM   fluticasone   1 spray Each Nare BID   fluticasone  furoate-vilanterol  1 puff Inhalation Daily   guaiFENesin   600 mg Oral BID   multivitamin with minerals  1 tablet Oral Daily   propranolol   40 mg Oral BID   umeclidinium bromide  1 puff Inhalation Daily   venlafaxine  XR  150  mg Oral Q breakfast   zinc sulfate (50mg  elemental zinc)  220 mg Oral Daily   Continuous Infusions:   LOS: 2 days  MDM: Patient is high risk for one or more organ failure.  They necessitate ongoing hospitalization for continued IV therapies and subsequent lab monitoring. Total time spent interpreting labs and vitals, reviewing the medical record, coordinating care amongst consultants and care team members, directly assessing and discussing care with the patient and/or family: 55 min  Elizibeth Breau, DO [facility]  To contact the attending physician between 7A-7P please use Epic Chat. To contact the covering physician during after hours 7P-7A, please review Amion.  06/22/2023, 2:00 PM   *This document has been created with the assistance of dictation software. Please excuse typographical errors. *

## 2023-06-22 NOTE — Evaluation (Signed)
 Physical Therapy Evaluation Patient Details Name: Sheri Barron MRN: [id number] DOB: 01/02/1956 Today's Date: 06/22/2023  History of Present Illness  Pt is a 68 y.o. female presenting by EMS with lethargy resulting in a fall/presyncope. EMS O2 sat 82% on room air and blood pressure in the 70s. Admitted for management of Acute on chronic respiratory failure with hypoxia and hypercapnia, COPD with acute bronchitis, severe sepsis, lactic acidosis and acute metabolic encephalopathy. PMH of COPD on 2 L oxygen at night, HTN, HLD, prior stroke, depression with anxiety.    Clinical Impression  Patient received supine in bed, agreeable to PT evaluation. Patient reports prior to admission,lives home alone with family support. Patient has mobile home with ramped entrance. Reports she is IND with ambulation with use of SPC (but seems to endorse furniture walking). Does not leave home often. On evaluation, patient continues to remain limited due to SOB. Sp02 stable on 3L with Sp02 maintaining above 90%. Patient able to complete bed mobility MOD I. Patient able to stand from EOB with CGA, and ambulation short distance with CGA with RW. Patient overall with poor posture with use of RW, cues to keep AD in close proximity. Improved stability with use of RW vs single HHA. Sp02 stayed above 90% with ambulation. Patient left in chair with all needs in reach, chair alarm set, and remain on 3L. Patient will benefit from skilled acute PT services to address functional impairments (see below for additional) and maximize functional mobility. Anticipate the need for follow up PT services upon acute hospital discharge. Will continue to follow acutely        If plan is discharge home, recommend the following: A little help with walking and/or transfers;A little help with bathing/dressing/bathroom;Assist for transportation;Help with stairs or ramp for entrance   Can travel by private vehicle   Yes    Equipment  Recommendations None recommended by PT (TBD at next level of care)  Recommendations for Other Services       Functional Status Assessment Patient has had a recent decline in their functional status and demonstrates the ability to make significant improvements in function in a reasonable and predictable amount of time.     Precautions / Restrictions Precautions Precautions: Fall Recall of Precautions/Restrictions: Intact Restrictions Weight Bearing Restrictions Per Provider Order: No      Mobility  Bed Mobility Overal bed mobility: Modified Independent             General bed mobility comments: no physical assist, increased time for completion and fatigue noted. Vitals stable    Transfers Overall transfer level: Needs assistance Equipment used: None Transfers: Sit to/from Stand, Bed to chair/wheelchair/BSC Sit to Stand: Contact guard assist Stand pivot transfers: Contact guard assist         General transfer comment: Patient able to stand from EOB with use of RW, CGA. Patient able to transfer with CGA with RW. Pt with increased forward posture, cues to keep AD in close proximity. Sp02 94% with standing on 3L supplemental 02 via [place].    Ambulation/Gait Ambulation/Gait assistance: Contact guard assist, Min assist Gait Distance (Feet): 15 Feet Assistive device: Rolling walker (2 wheels)   Gait velocity: Decreased     General Gait Details: Patient able to ambulate approx 15 ft in room with RW, CGA to Min A. Patient reports SOB 8-9/10 with short distance ambulation, Sp02 93% with ambulation. Require extended seated rest break after completion. Vitals stable. Require cues to keep AD in close proximity.  Stairs  Wheelchair Mobility     Tilt Bed    Modified Rankin (Stroke Patients Only)       Balance Overall balance assessment: Needs assistance Sitting-balance support: Feet supported, No upper extremity supported Sitting balance-Leahy Scale:  Good     Standing balance support: During functional activity, Reliant on assistive device for balance, Bilateral upper extremity supported Standing balance-Leahy Scale: Fair Standing balance comment: poor posture; increased reliance on RW                             Pertinent Vitals/Pain Pain Assessment Pain Assessment: No/denies pain    Home Living Family/patient expects to be discharged to:: Private residence Living Arrangements: Alone Available Help at Discharge: Family;Available PRN/intermittently;Friend(s) Type of Home: Mobile home Home Access: Ramped entrance       Home Layout: One level   Additional Comments: Reports has friend/family that can provide help if needed. Endorses multiple canes throughout the house.    Prior Function Prior Level of Function : Needs assist             Mobility Comments: reports on 2L at night; reports intermittent 02 use during the day with mobility. Was using a SPC; reports 6-8 falls in past 6 months. ADLs Comments: IND with ADLs, pays someone to come in and bathe her in the shower     Extremity/Trunk Assessment   Upper Extremity Assessment Upper Extremity Assessment: Defer to OT evaluation    Lower Extremity Assessment Lower Extremity Assessment: Generalized weakness       Communication   Communication Communication: No apparent difficulties    Cognition Arousal: Alert Behavior During Therapy: WFL for tasks assessed/performed   PT - Cognitive impairments: No apparent impairments                         Following commands: Intact       Cueing Cueing Techniques: Verbal cues, Gestural cues     General Comments General comments (skin integrity, edema, etc.): Vitals stable    Exercises     Assessment/Plan    PT Assessment Patient needs continued PT services  PT Problem List Decreased strength;Decreased activity tolerance;Decreased balance;Decreased mobility;Decreased knowledge of use of  DME;Cardiopulmonary status limiting activity       PT Treatment Interventions DME instruction;Gait training;Stair training;Therapeutic activities;Functional mobility training;Therapeutic exercise;Balance training    PT Goals (Current goals can be found in the Care Plan section)  Acute Rehab PT Goals Patient Stated Goal: Get Home PT Goal Formulation: With patient Time For Goal Achievement: 07/06/23 Potential to Achieve Goals: Fair    Frequency Min 2X/week     Co-evaluation               AM-PAC PT 6 Clicks Mobility  Outcome Measure Help needed turning from your back to your side while in a flat bed without using bedrails?: A Little Help needed moving from lying on your back to sitting on the side of a flat bed without using bedrails?: A Little Help needed moving to and from a bed to a chair (including a wheelchair)?: A Little Help needed standing up from a chair using your arms (e.g., wheelchair or bedside chair)?: A Little Help needed to walk in hospital room?: A Lot Help needed climbing 3-5 steps with a railing? : A Lot 6 Click Score: 16    End of Session Equipment Utilized During Treatment: Gait belt;Oxygen Activity Tolerance: Patient limited by  fatigue;Other (comment) (limited by SOB) Patient left: in chair;with chair alarm set;with call bell/phone within reach Nurse Communication: Mobility status PT Visit Diagnosis: Unsteadiness on feet (R26.81);Other abnormalities of gait and mobility (R26.89);Muscle weakness (generalized) (M62.81);Difficulty in walking, not elsewhere classified (R26.2)    Time: 1121-1140 PT Time Calculation (min) (ACUTE ONLY): 19 min   Charges:   PT Evaluation $PT Eval Low Complexity: 1 Low   PT General Charges $$ ACUTE PT VISIT: 1 Visit         Quillian Brunt Fairly, PT, DPT 06/22/23 1:10 PM

## 2023-06-23 DIAGNOSIS — J9621 Acute and chronic respiratory failure with hypoxia: Secondary | ICD-10-CM | POA: Diagnosis not present

## 2023-06-23 DIAGNOSIS — J441 Chronic obstructive pulmonary disease with (acute) exacerbation: Secondary | ICD-10-CM | POA: Diagnosis not present

## 2023-06-23 DIAGNOSIS — A419 Sepsis, unspecified organism: Secondary | ICD-10-CM | POA: Diagnosis not present

## 2023-06-23 DIAGNOSIS — E872 Acidosis, unspecified: Secondary | ICD-10-CM | POA: Diagnosis not present

## 2023-06-23 LAB — CBC WITH DIFFERENTIAL/PLATELET
Abs Immature Granulocytes: 0.15 10*3/uL — ABNORMAL HIGH (ref 0.00–0.07)
Basophils Absolute: 0 10*3/uL (ref 0.0–0.1)
Basophils Relative: 0 %
Eosinophils Absolute: 0 10*3/uL (ref 0.0–0.5)
Eosinophils Relative: 0 %
HCT: 29.6 % — ABNORMAL LOW (ref 36.0–46.0)
Hemoglobin: 9 g/dL — ABNORMAL LOW (ref 12.0–15.0)
Immature Granulocytes: 3 %
Lymphocytes Relative: 19 %
Lymphs Abs: 1 10*3/uL (ref 0.7–4.0)
MCH: 26.4 pg (ref 26.0–34.0)
MCHC: 30.4 g/dL (ref 30.0–36.0)
MCV: 86.8 fL (ref 80.0–100.0)
Monocytes Absolute: 0.3 10*3/uL (ref 0.1–1.0)
Monocytes Relative: 6 %
Neutro Abs: 4 10*3/uL (ref 1.7–7.7)
Neutrophils Relative %: 72 %
Platelets: 229 10*3/uL (ref 150–400)
RBC: 3.41 MIL/uL — ABNORMAL LOW (ref 3.87–5.11)
RDW: 13.9 % (ref 11.5–15.5)
WBC: 5.5 10*3/uL (ref 4.0–10.5)
nRBC: 0 % (ref 0.0–0.2)

## 2023-06-23 LAB — COMPREHENSIVE METABOLIC PANEL WITH GFR
ALT: 19 U/L (ref 0–44)
AST: 22 U/L (ref 15–41)
Albumin: 2.5 g/dL — ABNORMAL LOW (ref 3.5–5.0)
Alkaline Phosphatase: 64 U/L (ref 38–126)
Anion gap: 13 (ref 5–15)
BUN: 15 mg/dL (ref 8–23)
CO2: 39 mmol/L — ABNORMAL HIGH (ref 22–32)
Calcium: 8.7 mg/dL — ABNORMAL LOW (ref 8.9–10.3)
Chloride: 88 mmol/L — ABNORMAL LOW (ref 98–111)
Creatinine, Ser: 0.71 mg/dL (ref 0.44–1.00)
GFR, Estimated: >60 mL/min (ref >60)
Glucose, Bld: 130 mg/dL — ABNORMAL HIGH (ref 70–99)
Potassium: 3.2 mmol/L — ABNORMAL LOW (ref 3.5–5.1)
Sodium: 140 mmol/L (ref 135–145)
Total Bilirubin: 0.6 mg/dL (ref 0.0–1.2)
Total Protein: 5.7 g/dL — ABNORMAL LOW (ref 6.5–8.1)

## 2023-06-23 LAB — GLUCOSE, CAPILLARY
Glucose-Capillary: 281 mg/dL — ABNORMAL HIGH (ref 70–99)
Glucose-Capillary: 185 mg/dL — ABNORMAL HIGH (ref 70–99)

## 2023-06-23 LAB — MAGNESIUM: Magnesium: 1.8 mg/dL (ref 1.7–2.4)

## 2023-06-23 LAB — PHOSPHORUS: Phosphorus: 2.8 mg/dL (ref 2.5–4.6)

## 2023-06-23 MED ORDER — INSULIN ASPART 100 UNIT/ML IJ SOLN
0.0000 [IU] | Freq: Three times a day (TID) | INTRAMUSCULAR | Status: DC
  Administered 2023-06-23: 5 [IU] via SUBCUTANEOUS
  Administered 2023-06-24: 3 [IU] via SUBCUTANEOUS
  Filled 2023-06-23 (×2): qty 1

## 2023-06-23 MED ORDER — INSULIN ASPART 100 UNIT/ML IJ SOLN: 0.0000 [IU] | Freq: Every day | INTRAMUSCULAR | Status: DC

## 2023-06-23 MED ORDER — POTASSIUM CHLORIDE CRYS ER 20 MEQ PO TBCR
40.0000 meq | EXTENDED_RELEASE_TABLET | Freq: Once | ORAL | Status: DC
  Filled 2023-06-23: qty 2

## 2023-06-23 MED ORDER — LINACLOTIDE 145 MCG PO CAPS
290.0000 ug | ORAL_CAPSULE | Freq: Every day | ORAL | Status: DC
  Administered 2023-06-23 – 2023-06-24 (×2): 290 ug via ORAL
  Filled 2023-06-23 (×2): qty 2

## 2023-06-23 MED ORDER — POTASSIUM CHLORIDE CRYS ER 20 MEQ PO TBCR: 40.0000 meq | EXTENDED_RELEASE_TABLET | Freq: Once | ORAL | Status: DC

## 2023-06-23 MED ORDER — POTASSIUM CHLORIDE CRYS ER 20 MEQ PO TBCR
40.0000 meq | EXTENDED_RELEASE_TABLET | Freq: Once | ORAL | Status: AC
  Administered 2023-06-23: 40 meq via ORAL

## 2023-06-23 MED ORDER — LORAZEPAM 0.5 MG PO TABS
0.5000 mg | ORAL_TABLET | Freq: Three times a day (TID) | ORAL | Status: DC | PRN
  Administered 2023-06-23: 0.5 mg via ORAL
  Filled 2023-06-23: qty 1

## 2023-06-23 NOTE — Progress Notes (Signed)
 PT Cancellation Note  Patient Details Name: MATTISYN CARDONA MRN: [id number] DOB: May 28, 1955   Cancelled Treatment:    Reason Eval/Treat Not Completed: Patient declined, no reason specified. Patient seated in recliner with supplemental oxygen not donned when this author entered. Sp02 on 72% on RA, assisted to donn nasal cannula with improvements to 88-92%. Patient visibly frustrated, requests author to come back at later date/time. Requesting something for anxiety, RN/MD notified via secure chat.    Christine Morton M Fairly, PT, DPT 06/23/23 2:49 PM

## 2023-06-23 NOTE — Progress Notes (Signed)
 PROGRESS NOTE    Sheri Barron  ZOX:[id number] DOB: [birth year] DOA: 06/19/2023 PCP: Patrina Boos, MD  No chief complaint on file.   Hospital Course:  Sheri Barron is a 68 y.o. female with COPD chronically on 2 L O2 at night, hypertension, hyperlipidemia, prior CVA, depression, anxiety, who presented via EMS with lethargy and presyncopal episode.  Patient was found to have acute on chronic respiratory failure with hypoxia and hypercapnia.  She was admitted for COPD exacerbation.  Subjective: Patient still reports difficulty producing phlegm but reports she is starting to feel better.  She request refills of all of her inhalers at discharge.  Objective: Vitals:   06/23/23 0432 06/23/23 0717 06/23/23 1129 06/23/23 1139  BP: (!) 131/90 (!) 152/79 (!) 156/75   Pulse: 60 (!) 56 (!) 59   Resp: 19 19 19    Temp: 97.6 F (36.4 C) 97.8 F (36.6 C) 98.7 F (37.1 C)   TempSrc:      SpO2: 97% 99% 97% 97%  Weight:      Height:        Intake/Output Summary (Last 24 hours) at 06/23/2023 1315 Last data filed at 06/23/2023 1049 Gross per 24 hour  Intake 240 ml  Output 1 ml  Net 239 ml   Filed Weights   06/20/23 0023  Weight: 56.6 kg    Examination: General exam: Appears calm and comfortable, NAD  Respiratory system: No work of breathing, symmetric chest wall expansion, end expiratory wheezing, less coughing today.  Shortened expiratory phase.  On 2 L. Cardiovascular system: S1 & S2 heard, RRR.  Gastrointestinal system: Abdomen is nondistended, soft and nontender.  Neuro: Alert and oriented. No focal neurological deficits. Extremities: Symmetric, expected ROM Skin: No rashes, lesions Psychiatry: Demonstrates appropriate judgement and insight. Mood & affect appropriate for situation.   Assessment & Plan:  Principal Problem:   Acute on chronic respiratory failure with hypoxia and hypercapnia (HCC) Active Problems:   COPD with acute exacerbation (HCC)   Severe sepsis (HCC)    Lactic acidosis   AKI (acute kidney injury) (HCC)   Acute metabolic encephalopathy   Fall at home, initial encounter   Diabetes mellitus without complication (HCC)   Depression with anxiety   Cellulitis of sacral region    Acute on chronic respiratory failure with hypoxia and hypercapnia, initially required BiPAP therapy COPD with acute exacerbation - Continue with scheduled and as needed inhalers/ SIRS - Patient was started on vancomycin, Flagyl, cefepime.  No indication of infection.  These have been discontinued. - Initiated steroid therapy 6/11, will continue with Methylpred 40 mg.  Likely discharge home with prednisone  taper tomorrow - Will prescribe inhaler refills at discharge.  Patient reports that she is out of all - Continue with Mucinex  and antitussives - Encouraged continued flutter valve and incentive spirometry - Continue weaning oxygen as tolerated, currently on 2 L [place].  At baseline requires oxygen at night only.   At baseline she requires 2 L at night only. - O2 goal 89 to 92%  Severe sepsis ruled out Lactic acidosis - Lactic acid elevation secondary to respiratory failure - No indication of acute infection - Antibiotics discontinued - Monitor fever curve  Fall at home, initial encounter - No sustained injury -Continue PT/OT  Acute metabolic encephalopathy, resolved - On my evaluation patient is alert and oriented x 3.  No focal deficits - Head CT on arrival without acute findings - Continue frequent reorientation, delirium precautions   AKI, resolved  Type  2 diabetes without complication - Most recent hemoglobin A1c over 6 months ago 6.2% - Started on sensitive sliding scale for now.  May require higher dose given steroid therapy.  Monitor closely.  Depression Anxiety - Continue home meds  Benign essential tremors - Persistent bradycardia on telemetry.  Have discontinued propranolol  for now  DVT prophylaxis: Lovenox    Code Status: Limited: Do not  attempt resuscitation (DNR) -DNR-LIMITED -Do Not Intubate/DNI  Disposition: Currently requiring 2 L O2.  Above her baseline.  Still on IV steroids.  If she continues to improve will likely discharge home tomorrow  Consultants:    Procedures:    Antimicrobials:  Anti-infectives (From admission, onward)    Start     Dose/Rate Route Frequency Ordered Stop   06/22/23 0000  vancomycin (VANCOREADY) IVPB 1250 mg/250 mL  Status:  Discontinued        1,250 mg 166.7 mL/hr over 90 Minutes Intravenous Every 48 hours 06/20/23 0233 06/21/23 0721   06/21/23 0900  vancomycin (VANCOCIN) IVPB 1000 mg/200 mL premix  Status:  Discontinued        1,000 mg 200 mL/hr over 60 Minutes Intravenous Every 24 hours 06/21/23 0721 06/21/23 1621   06/20/23 1230  ceFEPIme (MAXIPIME) 2 g in sodium chloride  0.9 % 100 mL IVPB  Status:  Discontinued        2 g 200 mL/hr over 30 Minutes Intravenous Every 12 hours 06/20/23 0237 06/21/23 1621   06/20/23 0145  metroNIDAZOLE (FLAGYL) IVPB 500 mg  Status:  Discontinued        500 mg 100 mL/hr over 60 Minutes Intravenous Every 12 hours 06/20/23 0144 06/21/23 1621   06/19/23 2330  ceFEPIme (MAXIPIME) 2 g in sodium chloride  0.9 % 100 mL IVPB        2 g 200 mL/hr over 30 Minutes Intravenous  Once 06/19/23 2325 06/20/23 0111   06/19/23 2330  metroNIDAZOLE (FLAGYL) IVPB 500 mg  Status:  Discontinued        500 mg 100 mL/hr over 60 Minutes Intravenous  Once 06/19/23 2325 06/20/23 0326   06/19/23 2330  vancomycin (VANCOCIN) IVPB 1000 mg/200 mL premix        1,000 mg 200 mL/hr over 60 Minutes Intravenous  Once 06/19/23 2325 06/20/23 0326       Data Reviewed: I have personally reviewed following labs and imaging studies CBC: Recent Labs  Lab 06/19/23 2255 06/21/23 0519 06/22/23 0547 06/23/23 0419  WBC 17.4* 12.0* 9.2 5.5  NEUTROABS 13.9*  --   --  4.0  HGB 11.1* 9.1* 9.4* 9.0*  HCT 36.4 31.1* 31.2* 29.6*  MCV 87.3 90.1 89.7 86.8  PLT 301 253 244 229   Basic  Metabolic Panel: Recent Labs  Lab 06/19/23 2255 06/20/23 1310 06/21/23 0519 06/22/23 0547 06/23/23 0419  NA 138 140 143 140 140  K 3.3* 4.0 3.5 3.9 3.2*  CL 85* 92* 94* 89* 88*  CO2 38* 40* 38* 38* 39*  GLUCOSE 148* 175* 89 98 130*  BUN 28* 21 18 17 15   CREATININE 1.18* 0.93 0.75 0.72 0.71  CALCIUM 8.8* 8.5* 8.8* 8.9 8.7*  MG  --  1.7 2.0 1.6* 1.8  PHOS  --  3.8 2.1* 3.2 2.8   GFR: Estimated Creatinine Clearance: 51.5 mL/min (by C-G formula based on SCr of 0.71 mg/dL). Liver Function Tests: Recent Labs  Lab 06/19/23 2255 06/23/23 0419  AST 27 22  ALT 13 19  ALKPHOS 83 64  BILITOT 0.4 0.6  PROT 6.7  5.7*  ALBUMIN 3.0* 2.5*   CBG: Recent Labs  Lab 06/19/23 2258  GLUCAP 150*    Recent Results (from the past 240 hours)  Resp panel by RT-PCR (RSV, Flu A&B, Covid) Anterior Nasal Swab     Status: None   Collection Time: 06/19/23 10:56 PM   Specimen: Anterior Nasal Swab  Result Value Ref Range Status   SARS Coronavirus 2 by RT PCR NEGATIVE NEGATIVE Final    Comment: (NOTE) SARS-CoV-2 target nucleic acids are NOT DETECTED.  The SARS-CoV-2 RNA is generally detectable in upper respiratory specimens during the acute phase of infection. The lowest concentration of SARS-CoV-2 viral copies this assay can detect is 138 copies/mL. A negative result does not preclude SARS-Cov-2 infection and should not be used as the sole basis for treatment or other patient management decisions. A negative result may occur with  improper specimen collection/handling, submission of specimen other than nasopharyngeal swab, presence of viral mutation(s) within the areas targeted by this assay, and inadequate number of viral copies(<138 copies/mL). A negative result must be combined with clinical observations, patient history, and epidemiological information. The expected result is Negative.  Fact Sheet for Patients:  BloggerCourse.com  Fact Sheet for Healthcare  Providers:  SeriousBroker.it  This test is no t yet approved or cleared by the [place]  FDA and  has been authorized for detection and/or diagnosis of SARS-CoV-2 by FDA under an Emergency Use Authorization (EUA). This EUA will remain  in effect (meaning this test can be used) for the duration of the COVID-19 declaration under Section 564(b)(1) of the Act, 21 U.S.C.section 360bbb-3(b)(1), unless the authorization is terminated  or revoked sooner.       Influenza A by PCR NEGATIVE NEGATIVE Final   Influenza B by PCR NEGATIVE NEGATIVE Final    Comment: (NOTE) The Xpert Xpress SARS-CoV-2/FLU/RSV plus assay is intended as an aid in the diagnosis of influenza from Nasopharyngeal swab specimens and should not be used as a sole basis for treatment. Nasal washings and aspirates are unacceptable for Xpert Xpress SARS-CoV-2/FLU/RSV testing.  Fact Sheet for Patients: BloggerCourse.com  Fact Sheet for Healthcare Providers: SeriousBroker.it  This test is not yet approved or cleared by the [place]  FDA and has been authorized for detection and/or diagnosis of SARS-CoV-2 by FDA under an Emergency Use Authorization (EUA). This EUA will remain in effect (meaning this test can be used) for the duration of the COVID-19 declaration under Section 564(b)(1) of the Act, 21 U.S.C. section 360bbb-3(b)(1), unless the authorization is terminated or revoked.     Resp Syncytial Virus by PCR NEGATIVE NEGATIVE Final    Comment: (NOTE) Fact Sheet for Patients: BloggerCourse.com  Fact Sheet for Healthcare Providers: SeriousBroker.it  This test is not yet approved or cleared by the [place]  FDA and has been authorized for detection and/or diagnosis of SARS-CoV-2 by FDA under an Emergency Use Authorization (EUA). This EUA will remain in effect (meaning this test can be  used) for the duration of the COVID-19 declaration under Section 564(b)(1) of the Act, 21 U.S.C. section 360bbb-3(b)(1), unless the authorization is terminated or revoked.  Performed at [facility], [address] [place], [place] [address]   Blood Culture (routine x 2)     Status: None (Preliminary result)   Collection Time: 06/19/23 11:56 PM   Specimen: BLOOD LEFT HAND  Result Value Ref Range Status   Specimen Description BLOOD LEFT HAND  Final   Special Requests   Final    BOTTLES DRAWN  AEROBIC AND ANAEROBIC Blood Culture adequate volume   Culture   Final    NO GROWTH 3 DAYS Performed at [facility], [address] [place], [place] [address]    Report Status PENDING  Incomplete  Blood Culture (routine x 2)     Status: None (Preliminary result)   Collection Time: 06/20/23 12:00 AM   Specimen: BLOOD RIGHT ARM  Result Value Ref Range Status   Specimen Description BLOOD RIGHT ARM  Final   Special Requests   Final    BOTTLES DRAWN AEROBIC AND ANAEROBIC Blood Culture adequate volume   Culture   Final    NO GROWTH 3 DAYS Performed at [facility], [address] [place], [place] [address]    Report Status PENDING  Incomplete  MRSA Next Gen by PCR, Nasal     Status: None   Collection Time: 06/21/23  5:58 PM   Specimen: Nasal Mucosa; Nasal Swab  Result Value Ref Range Status   MRSA by PCR Next Gen NOT DETECTED NOT DETECTED Final    Comment: (NOTE) The GeneXpert MRSA Assay (FDA approved for NASAL specimens only), is one component of a comprehensive MRSA colonization surveillance program. It is not intended to diagnose MRSA infection nor to guide or monitor treatment for MRSA infections. Test performance is not FDA approved in patients less than 31 years old. Performed at [facility], [address] [place], [place] [address]      Radiology Studies: No results found.  Scheduled Meds:  vitamin C  500 mg Oral BID   aspirin  EC   81 mg Oral Daily   baclofen   10 mg Oral TID   busPIRone   15 mg Oral BID   enoxaparin  (LOVENOX ) injection  40 mg Subcutaneous Q24H   feeding supplement  237 mL Oral BID BM   fluticasone   1 spray Each Nare BID   fluticasone  furoate-vilanterol  1 puff Inhalation Daily   guaiFENesin   600 mg Oral BID   lisinopril  10 mg Oral Daily   And   hydrochlorothiazide  12.5 mg Oral Daily   linaclotide   290 mcg Oral Daily   methylPREDNISolone  (SOLU-MEDROL ) injection  40 mg Intravenous Daily   multivitamin with minerals  1 tablet Oral Daily   pantoprazole   40 mg Oral Daily   traZODone   300 mg Oral QHS   umeclidinium bromide  1 puff Inhalation Daily   venlafaxine  XR  150 mg Oral Q breakfast   zinc sulfate (50mg  elemental zinc)  220 mg Oral Daily   Continuous Infusions:   LOS: 3 days  MDM: Patient is high risk for one or more organ failure.  They necessitate ongoing hospitalization for continued IV therapies and subsequent lab monitoring. Total time spent interpreting labs and vitals, reviewing the medical record, coordinating care amongst consultants and care team members, directly assessing and discussing care with the patient and/or family: 55 min  Sigmond Patalano, DO [facility]  To contact the attending physician between 7A-7P please use Epic Chat. To contact the covering physician during after hours 7P-7A, please review Amion.  06/23/2023, 1:15 PM   *This document has been created with the assistance of dictation software. Please excuse typographical errors. *

## 2023-06-23 NOTE — Consult Note (Signed)
 PHARMACY CONSULT NOTE - ELECTROLYTES  Pharmacy Consult for Electrolyte Monitoring and Replacement   Recent Labs: Height: 5' 1 (154.9 cm) Weight: 56.6 kg (124 lb 12.5 oz) IBW/kg (Calculated) : 47.8 Estimated Creatinine Clearance: 51.5 mL/min (by C-G formula based on SCr of 0.71 mg/dL).  Potassium (mmol/L)  Date Value  06/23/2023 3.2 (L)  10/18/2013 3.6   Magnesium  (mg/dL)  Date Value  40/98/1191 1.8  09/05/2013 1.9   Calcium (mg/dL)  Date Value  47/82/9562 8.7 (L)   Calcium, Total (mg/dL)  Date Value  13/08/6576 8.3 (L)   Albumin (g/dL)  Date Value  46/96/2952 2.5 (L)  10/18/2013 3.4   Phosphorus (mg/dL)  Date Value  84/13/2440 2.8   Sodium (mmol/L)  Date Value  06/23/2023 140  10/18/2013 141   Corrected Ca: 9.9 mg/dL  Assessment  Sheri Barron is a 68 y.o. female presenting with lethargy resulting in a a fall/presyncope. PMH significant for COPD on 2 L oxygen at night, HTN, HLD, prior stroke, & depression with anxiety. Pharmacy has been consulted to monitor and replace electrolytes.  Diet: PO  MIVF: none Pertinent medications: lisinopril 10 mg daily, hydrochlorothiazide 12.5 mg daily   Goal of Therapy: Electrolytes WNL  Plan:   K+ 3.2 - will order KCl 40 mEq PO x 1 dose  No other electrolyte replacement indicated at this time  Check BMP and Mg with AM labs  Thank you for allowing pharmacy to be a part of this patient's care.  Pansy Bogus, PharmD Pharmacy Resident  06/23/2023 7:01 AM

## 2023-06-23 NOTE — Progress Notes (Addendum)
 [facility] Liaison Note:   (new referral for outpatient palliative services)  Notified by [facility], Marcela Senters, RN,   of patient/family request for [facility] Palliative Care services at home after discharge.  Referral submitted today  Please call with any hospice or outpatient palliative care related questions.  Thank you for the opportunity to participate in this patient's care.  Helmut Lobe, [facility] Liaison [phone]

## 2023-06-23 NOTE — TOC Progression Note (Signed)
 Transition of Care [facility]) - Progression Note    Patient Details  Name: Sheri Barron MRN: [id number] Date of Birth: [birth year]  Transition of Care [facility]) CM/SW Contact  Crayton Docker, RN 06/23/2023, 1:48 PM  Clinical Narrative:      CM call to [employer] regarding home oxygen, portable oxygen and 3 in 1 bedside commode. Per [facility] Cornea, will process for delivery to bedside. CM call to patient's granddaughter, [name], phone; [phone] regarding discharge care planning. Per [place], will provide transportation and caregiver support. [facility] following for home health PT/OT. CM call to patient's daughter, Con Decant regarding pending discharge and caregiver support. Per patient's daughter, Con Decant, phone: [phone], patient's granddaughter, will provide transportation and caregiver support.   Secure message from [employer], ETA on portable oxygen is 1445.     Living arrangements for the past 2 months: Single Family Home                   Social Determinants of Health (SDOH) Interventions SDOH Screenings   Food Insecurity: Food Insecurity Present (06/20/2023)  Housing: Low Risk  (06/20/2023)  Transportation Needs: Unmet Transportation Needs (06/20/2023)  Utilities: Not At Risk (06/20/2023)  Social Connections: Patient Declined (06/20/2023)  Tobacco Use: High Risk (06/20/2023)    Readmission Risk Interventions     No data to display

## 2023-06-24 ENCOUNTER — Other Ambulatory Visit: Payer: Self-pay

## 2023-06-24 DIAGNOSIS — E872 Acidosis, unspecified: Secondary | ICD-10-CM | POA: Diagnosis not present

## 2023-06-24 DIAGNOSIS — A419 Sepsis, unspecified organism: Secondary | ICD-10-CM | POA: Diagnosis not present

## 2023-06-24 DIAGNOSIS — J9621 Acute and chronic respiratory failure with hypoxia: Secondary | ICD-10-CM | POA: Diagnosis not present

## 2023-06-24 DIAGNOSIS — J441 Chronic obstructive pulmonary disease with (acute) exacerbation: Secondary | ICD-10-CM | POA: Diagnosis not present

## 2023-06-24 LAB — COMPREHENSIVE METABOLIC PANEL WITH GFR
ALT: 19 U/L (ref 0–44)
AST: 21 U/L (ref 15–41)
Albumin: 2.5 g/dL — ABNORMAL LOW (ref 3.5–5.0)
Alkaline Phosphatase: 63 U/L (ref 38–126)
Anion gap: 13 (ref 5–15)
BUN: 17 mg/dL (ref 8–23)
CO2: 38 mmol/L — ABNORMAL HIGH (ref 22–32)
Calcium: 8.6 mg/dL — ABNORMAL LOW (ref 8.9–10.3)
Chloride: 92 mmol/L — ABNORMAL LOW (ref 98–111)
Creatinine, Ser: 0.76 mg/dL (ref 0.44–1.00)
GFR, Estimated: >60 mL/min (ref >60)
Glucose, Bld: 86 mg/dL (ref 70–99)
Potassium: 2.9 mmol/L — ABNORMAL LOW (ref 3.5–5.1)
Sodium: 143 mmol/L (ref 135–145)
Total Bilirubin: 0.6 mg/dL (ref 0.0–1.2)
Total Protein: 5.3 g/dL — ABNORMAL LOW (ref 6.5–8.1)

## 2023-06-24 LAB — CBC WITH DIFFERENTIAL/PLATELET
Abs Immature Granulocytes: 0.11 10*3/uL — ABNORMAL HIGH (ref 0.00–0.07)
Basophils Absolute: 0 10*3/uL (ref 0.0–0.1)
Basophils Relative: 0 %
Eosinophils Absolute: 0 10*3/uL (ref 0.0–0.5)
Eosinophils Relative: 0 %
HCT: 28.6 % — ABNORMAL LOW (ref 36.0–46.0)
Hemoglobin: 8.8 g/dL — ABNORMAL LOW (ref 12.0–15.0)
Immature Granulocytes: 2 %
Lymphocytes Relative: 24 %
Lymphs Abs: 1.7 10*3/uL (ref 0.7–4.0)
MCH: 26.4 pg (ref 26.0–34.0)
MCHC: 30.8 g/dL (ref 30.0–36.0)
MCV: 85.9 fL (ref 80.0–100.0)
Monocytes Absolute: 0.6 10*3/uL (ref 0.1–1.0)
Monocytes Relative: 9 %
Neutro Abs: 4.6 10*3/uL (ref 1.7–7.7)
Neutrophils Relative %: 65 %
Platelets: 265 10*3/uL (ref 150–400)
RBC: 3.33 MIL/uL — ABNORMAL LOW (ref 3.87–5.11)
RDW: 14.1 % (ref 11.5–15.5)
WBC: 7.1 10*3/uL (ref 4.0–10.5)
nRBC: 0 % (ref 0.0–0.2)

## 2023-06-24 LAB — GLUCOSE, CAPILLARY
Glucose-Capillary: 101 mg/dL — ABNORMAL HIGH (ref 70–99)
Glucose-Capillary: 222 mg/dL — ABNORMAL HIGH (ref 70–99)

## 2023-06-24 LAB — PHOSPHORUS: Phosphorus: 1.5 mg/dL — ABNORMAL LOW (ref 2.5–4.6)

## 2023-06-24 LAB — HEMOGLOBIN A1C
Hgb A1c MFr Bld: 5.8 % — ABNORMAL HIGH (ref 4.8–5.6)
Mean Plasma Glucose: 120 mg/dL

## 2023-06-24 LAB — MAGNESIUM: Magnesium: 1.6 mg/dL — ABNORMAL LOW (ref 1.7–2.4)

## 2023-06-24 MED ORDER — K PHOS MONO-SOD PHOS DI & MONO 155-852-130 MG PO TABS
500.0000 mg | ORAL_TABLET | ORAL | Status: DC
  Administered 2023-06-24 (×2): 500 mg via ORAL
  Filled 2023-06-24 (×2): qty 2

## 2023-06-24 MED ORDER — PREDNISONE 20 MG PO TABS
ORAL_TABLET | ORAL | 0 refills | Status: AC
  Filled 2023-06-24: qty 7, 6d supply, fill #0

## 2023-06-24 MED ORDER — ASPIRIN 81 MG PO TBEC: 81.0000 mg | DELAYED_RELEASE_TABLET | Freq: Every day | ORAL | Status: AC

## 2023-06-24 MED ORDER — MAGNESIUM SULFATE 2 GM/50ML IV SOLN
2.0000 g | Freq: Once | INTRAVENOUS | Status: AC
  Administered 2023-06-24: 2 g via INTRAVENOUS
  Filled 2023-06-24: qty 50

## 2023-06-24 MED ORDER — TIOTROPIUM BROMIDE MONOHYDRATE 18 MCG IN CAPS
18.0000 ug | ORAL_CAPSULE | Freq: Every day | RESPIRATORY_TRACT | 0 refills | Status: DC
  Filled 2023-06-24: qty 30, 30d supply, fill #0

## 2023-06-24 MED ORDER — LISINOPRIL 30 MG PO TABS
30.0000 mg | ORAL_TABLET | Freq: Every day | ORAL | 0 refills | Status: DC
  Filled 2023-06-24: qty 30, 30d supply, fill #0

## 2023-06-24 MED ORDER — ALBUTEROL SULFATE HFA 108 (90 BASE) MCG/ACT IN AERS: 2.0000 | INHALATION_SPRAY | Freq: Four times a day (QID) | RESPIRATORY_TRACT | 2 refills | Status: AC | PRN

## 2023-06-24 MED ORDER — FLUTICASONE-SALMETEROL 250-50 MCG/ACT IN AEPB
1.0000 | INHALATION_SPRAY | Freq: Two times a day (BID) | RESPIRATORY_TRACT | 0 refills | Status: AC
  Filled 2023-06-24: qty 60, 30d supply, fill #0

## 2023-06-24 MED ORDER — POTASSIUM CHLORIDE CRYS ER 20 MEQ PO TBCR
40.0000 meq | EXTENDED_RELEASE_TABLET | Freq: Two times a day (BID) | ORAL | Status: DC
  Administered 2023-06-24: 40 meq via ORAL
  Filled 2023-06-24: qty 2

## 2023-06-24 NOTE — Consult Note (Signed)
 PHARMACY CONSULT NOTE - ELECTROLYTES  Pharmacy Consult for Electrolyte Monitoring and Replacement   Recent Labs: Height: 5' 1 (154.9 cm) Weight: 56.6 kg (124 lb 12.5 oz) IBW/kg (Calculated) : 47.8 Estimated Creatinine Clearance: 51.5 mL/min (by C-G formula based on SCr of 0.76 mg/dL).  Potassium (mmol/L)  Date Value  06/24/2023 2.9 (L)  10/18/2013 3.6   Magnesium  (mg/dL)  Date Value  91/47/8295 1.6 (L)  09/05/2013 1.9   Calcium (mg/dL)  Date Value  62/13/0865 8.6 (L)   Calcium, Total (mg/dL)  Date Value  78/46/9629 8.3 (L)   Albumin (g/dL)  Date Value  52/84/1324 2.5 (L)  10/18/2013 3.4   Phosphorus (mg/dL)  Date Value  40/10/2723 1.5 (L)   Sodium (mmol/L)  Date Value  06/24/2023 143  10/18/2013 141    Assessment  Sheri Barron is a 68 y.o. female presenting with lethargy resulting in a a fall/presyncope. PMH significant for COPD on 2 L oxygen at night, HTN, HLD, prior stroke, & depression with anxiety. Pharmacy has been consulted to monitor and replace electrolytes.  Diet: PO  MIVF: none Pertinent medications: lisinopril  10 mg daily, hydrochlorothiazide  12.5 mg daily   Goal of Therapy: Electrolytes WNL  Plan:   KCL 40 mEq x 2 Kphos 2 tabs x 4 Mg 2 g IV x 1 F/u with AM labs.   Thank you for allowing pharmacy to be a part of this patient's care.  Trinidad Funk, PharmD 06/24/2023 7:58 AM

## 2023-06-24 NOTE — Plan of Care (Signed)
 Palliative consult received.  Review of chart, plan for discharge today and confirmed with primary team. Referral for outpatient palliative care placed and [facility] has been engaged. No acute ongoing inpatient palliative care needs at this time.  No Charge.  Isadore Marble, DNP, AGNP-C Palliative Medicine  Please call Palliative Medicine team phone with any questions [phone]. For individual providers please see AMION.

## 2023-06-24 NOTE — TOC Transition Note (Addendum)
 Transition of Care [facility]) - Discharge Note   Patient Details  Name: Sheri Barron MRN: [id number] Date of Birth: Sep 10, 1955  Transition of Care [facility]) CM/SW Contact:  Crayton Docker, RN 06/24/2023, 2:55 PM   Clinical Narrative:     Alert from Dr. Marquette Sites, regarding DME order today for rolling walker. CM secure message to James, [employer] regarding rolling walker for pending discharge. Discharge orders noted for home health RN/PT/OT. [facility] following for start of care of, Monday, 06/27/2023, Dr. Marquette Sites, aware. CM call to patient's granddaughter, [name], phone: [phone] regarding pending discharge and CM awaiting DME order for rolling walker and [facility] and start of care date. Patient's granddaughter, [name] verbalized understanding and agreement.   Alert received from RN Jolena Nay, patient discharged without waiting for rolling walker.   Call from James, [employer], will send rolling walker to home address on file due to patient discharged without waiting for walker.   Secure message from James, [employer], will deliver rolling walker to home address with rolling walker today.   Call received from [facility], patient's granddaughter, patient arrived home, note on door from [employer] regarding missed appointment. CM call to James, [employer], phone: [phone] regarding delivery tech missed appointment for delivery of stationary oxygen concentrator and rolling walker. Per James, [employer], will message delivery tech to return for delivery of stationary oxygen and rolling walker. Final next level of care: Home w Home Health Services Barriers to Discharge: No Barriers Identified   Patient Goals and CMS Choice    SNF  Discharge Placement     SNF          Discharge Plan and Services Additional resources added to the After Visit Summary for             DME Agency: [facility] Date DME Agency Contacted: 06/24/23 Time DME Agency Contacted: 1445 Representative spoke with at DME  Agency: [facility]  Social Drivers of Health (SDOH) Interventions SDOH Screenings   Food Insecurity: Food Insecurity Present (06/20/2023)  Housing: Low Risk  (06/20/2023)  Transportation Needs: Unmet Transportation Needs (06/20/2023)  Utilities: Not At Risk (06/20/2023)  Social Connections: Patient Declined (06/20/2023)  Tobacco Use: High Risk (06/20/2023)     Readmission Risk Interventions     No data to display

## 2023-06-24 NOTE — Discharge Summary (Signed)
 Physician Discharge Summary   Patient: Sheri Barron MRN: [id number] DOB: Feb 10, 1955  Admit date:     06/19/2023  Discharge date: 06/24/23  Discharge Physician: Roise Cleaver   PCP: Adamo, Elena M, MD   Recommendations at discharge:   Keep close outpatient follow-up with primary care and pulmonology.  Discharge Diagnoses: Principal Problem:   Acute on chronic respiratory failure with hypoxia and hypercapnia (HCC) Active Problems:   COPD with acute exacerbation (HCC)   Severe sepsis (HCC)   Lactic acidosis   AKI (acute kidney injury) (HCC)   Acute metabolic encephalopathy   Fall at home, initial encounter   Diabetes mellitus without complication (HCC)   Depression with anxiety   Cellulitis of sacral region  Resolved Problems:   * No resolved hospital problems. *  Hospital Course: MAAT KAFER is a 68 y.o. female with COPD chronically on 2 L O2 at night, hypertension, hyperlipidemia, prior CVA, depression, anxiety, who presented via EMS with lethargy and presyncopal episode.  Patient was found to have acute on chronic respiratory failure with hypoxia and hypercapnia.  She was admitted for COPD exacerbation.  Patient was initially started on IV antibiotics but there was no indication of infection so these were discontinued.  She was initiated on steroid therapy and had significant improvement.  She still becomes very dyspneic with exertion but was able to maintain her home oxygen requirement.  She does report that she has recently lost access to home O2 and needs refills of all of her medications.  These were prescribed to her and arranged at discharge.  Patient worked with physical therapy and there was discussion of discharge to SNF but patient adamantly refuses this at this time and would like to discharge home directly. Home health arrangements, including DME and outpatient palliative care were arranged prior to discharge.  I have discussed this care plan with her caretaker,  granddaughter [place].  [place] endorses understanding.  If the patient is unable to maintain her health needs at home she should return to the hospital.  I discussed this with them both.  May need to consider skilled nursing facility if the patient requires an additional hospitalization in the future.   Acute on chronic respiratory failure with hypoxia and hypercapnia, initially required BiPAP therapy COPD with acute exacerbation - Continue with scheduled and as needed inhalers SIRS - Patient was started on vancomycin , Flagyl , cefepime .  No indication of infection.  These have been discontinued. - Initiated steroid therapy 6/11, will continue with Methylpred 40 mg.  Discharge home with prednisone  taper tomorrow - Will prescribe inhaler refills at discharge.  - Continue with Mucinex  and antitussives - Encouraged continued flutter valve and incentive spirometry - Home O2 arranged.  Should discharge with 2 L constantly.  May be able to wean to nightly only patient - O2 goal 89 to 92%   Severe sepsis ruled out Lactic acidosis - Lactic acid elevation secondary to respiratory failure - No indication of acute infection - Antibiotics discontinued   Fall at home, initial encounter - No sustained injury -Continue PT/OT via HH    Acute metabolic encephalopathy, resolved - On my evaluation patient is alert and oriented x 3.  No focal deficits - Head CT on arrival without acute findings   AKI, resolved   Type 2 diabetes without complication - Controlled. Hgb A1c: 5.8%   Depression Anxiety - Continue home meds   Benign essential tremors - Persistent bradycardia on telemetry.  Have discontinued propranolol  for now  Hypophosphatemia  Hypomagnesemia Hypoalbuminemia Hypokalemia - Replaced all - Trend CMP outpatient     Consultants: n/a Procedures performed: n/a  Disposition: Home health Diet recommendation:  Discharge Diet Orders (From admission, onward)     Start     Ordered    06/24/23 0000  Diet general        06/24/23 1420           Cardiac and Carb modified diet DISCHARGE MEDICATION: Allergies as of 06/24/2023       Reactions   Sulfa Antibiotics Nausea Only   Patient states she gets dehydrated with sulfa as well.   Sulfasalazine Nausea Only   Patient states she gets dehydrated with sulfa as well.        Medication List     STOP taking these medications    ibuprofen 200 MG tablet Commonly known as: ADVIL   lisinopril -hydrochlorothiazide  10-12.5 MG tablet Commonly known as: ZESTORETIC    propranolol  20 MG tablet Commonly known as: INDERAL    propranolol  80 MG tablet Commonly known as: INDERAL        TAKE these medications    albuterol  (2.5 MG/3ML) 0.083% nebulizer solution Commonly known as: PROVENTIL  Take 2.5 mg by nebulization every 6 (six) hours as needed.   albuterol  108 (90 Base) MCG/ACT inhaler Commonly known as: VENTOLIN  HFA Inhale 2 puffs into the lungs every 6 (six) hours as needed for wheezing or shortness of breath.   aspirin  EC 81 MG tablet Take 1 tablet (81 mg total) by mouth daily. Swallow whole.   baclofen  10 MG tablet Commonly known as: LIORESAL  Take 10 mg by mouth 3 (three) times daily.   busPIRone  15 MG tablet Commonly known as: BUSPAR  Take 15 mg by mouth 2 (two) times daily.   dextromethorphan -guaiFENesin  30-600 MG 12hr tablet Commonly known as: MUCINEX  DM Take 2 tablets by mouth 2 (two) times daily as needed for cough.   fluticasone  50 MCG/ACT nasal spray Commonly known as: FLONASE  Place 1 spray into both nostrils 2 (two) times daily.   fluticasone -salmeterol 250-50 MCG/ACT Aepb Commonly known as: ADVAIR Inhale 1 puff into the lungs 2 (two) times daily.   lidocaine  5 % Commonly known as: LIDODERM  Place 1 patch onto the skin daily as needed. For pain   Linzess  290 MCG Caps capsule Generic drug: linaclotide  Take 290 mcg by mouth daily.   lisinopril  30 MG tablet Commonly known as:  ZESTRIL  Take 1 tablet (30 mg total) by mouth daily. Start taking on: June 25, 2023   loratadine  10 MG tablet Commonly known as: CLARITIN  Take 10 mg by mouth daily.   metFORMIN  500 MG tablet Commonly known as: GLUCOPHAGE  Take 500 mg by mouth 2 (two) times daily with a meal.   nicotine  21 mg/24hr patch Commonly known as: NICODERM CQ  - dosed in mg/24 hours Place 1 patch (21 mg total) onto the skin daily.   omeprazole 40 MG capsule Commonly known as: PRILOSEC Take 40 mg by mouth daily.   polyethylene glycol 17 g packet Commonly known as: MIRALAX  / GLYCOLAX  Take 17 g by mouth at bedtime.   predniSONE  20 MG tablet Commonly known as: DELTASONE  Take 2 tablets (40 mg total) by mouth daily with breakfast for 2 days, THEN 1 tablet (20 mg total) daily with breakfast for 2 days, THEN 0.5 tablets (10 mg total) daily with breakfast for 2 days. Start taking on: June 24, 2023   tiotropium 18 MCG inhalation capsule Commonly known as: SPIRIVA  Place 1 capsule (18 mcg total) into inhaler and  inhale daily.   traZODone  100 MG tablet Commonly known as: DESYREL  Take 300 mg by mouth at bedtime.   venlafaxine  XR 150 MG 24 hr capsule Commonly known as: EFFEXOR -XR Take 150 mg by mouth daily with breakfast.               Durable Medical Equipment  (From admission, onward)           Start     Ordered   06/24/23 1357  For home use only DME Walker rolling  Once       Question Answer Comment  Walker: With 5 Inch Wheels   Patient needs a walker to treat with the following condition COPD with acute exacerbation (HCC)      06/24/23 1356   06/23/23 1401  For home use only DME oxygen  Once       Question Answer Comment  Length of Need Lifetime   Mode or (Route) Nasal cannula   Liters per Minute 2   Frequency Continuous (stationary and portable oxygen unit needed)   Oxygen conserving device Yes   Oxygen delivery system Gas      06/23/23 1401   06/23/23 1350  For home use only DME 3 n  1  Once        06/23/23 1350            Follow-up Information     Adamo, Sherrin Dollar, MD Follow up.   Specialty: Family Medicine Why: Hospital follow up Contact information: [address] [place] [address] [phone]         [facility] Follow up.   Specialty: Home Health Services Why: 06/10--[facility] will begin to follow for  home health PT/OT services. Contact information: [address] [place] [address] [phone]         [facility] [facility] [employer] Follow up.   Why: 06/12---Per Royston Cornea, will process orders for home oxygen, portable oxygen and 3 in 1 bedside commode. Contact information: [address] [place] [address] [phone]                Discharge Exam: Cleavon Curls Weights   06/20/23 0023  Weight: 56.6 kg   General exam: Appears calm and comfortable, NAD  Respiratory system: No work of breathing, symmetric chest wall expansion,mild expiratory wheezing.  Shortened expiratory phase.  On 2 L. Cardiovascular system: S1 & S2 heard, RRR.  Gastrointestinal system: Abdomen is nondistended, soft and nontender.  Neuro: Alert and oriented. No focal neurological deficits. Extremities: Symmetric, expected ROM Skin: No rashes, lesions Psychiatry: Demonstrates appropriate judgement and insight. Mood & affect appropriate for situation.   Condition at discharge: stable  The results of significant diagnostics from this hospitalization (including imaging, microbiology, ancillary and laboratory) are listed below for reference.   Imaging Studies: CT HEAD WO CONTRAST ( ) Result Date: 06/20/2023 CLINICAL DATA:  Neck trauma, mental status change, fall, hypotension. Patient does not remember falling and is unsure if passed out. History of COPD wearing 2 L of oxygen at baseline. 82% on room air. Sepsis. EXAM: CT HEAD WITHOUT CONTRAST CT CERVICAL SPINE WITHOUT CONTRAST CT CHEST, ABDOMEN AND PELVIS WITHOUT CONTRAST TECHNIQUE: Contiguous  axial images were obtained from the base of the skull through the vertex without intravenous contrast. Multidetector CT imaging of the cervical spine was performed without intravenous contrast. Multiplanar CT image reconstructions were also generated. Multidetector CT imaging of the chest, abdomen and pelvis was performed following the standard protocol without IV contrast.  RADIATION DOSE REDUCTION: This exam was performed according to the departmental dose-optimization program which includes automated exposure control, adjustment of the mA and/or kV according to patient size and/or use of iterative reconstruction technique. COMPARISON:  Chest radiograph 06/19/2023; CTA chest 11/28/2022; CT abdomen pelvis 04/13/2016; CT head 09/26/2006 FINDINGS: CT HEAD FINDINGS Brain: No evidence of acute infarction, hemorrhage, hydrocephalus, extra-axial collection or mass lesion/mass effect. Vascular: No hyperdense vessel or unexpected calcification. Skull: Normal. Negative for fracture or focal lesion. Sinuses/Orbits: No acute finding. Other: None. CT CERVICAL FINDINGS Alignment: No evidence of traumatic listhesis. Skull base and vertebrae: No acute fracture. Soft tissues and spinal canal: No prevertebral fluid or swelling. No visible canal hematoma. Disc levels: Multilevel spondylosis, disc space height loss, and degenerative endplate changes greatest at C5-C6 and C6-C7 where it is moderate. Posterior disc osteophyte complex at C5-C6 and C6-C7 cause is moderate effacement of the thecal sac. Uncovertebral spurring and facet arthropathy causes multilevel neural foraminal narrowing greatest on the left at C5-C6 and bilaterally at C6-C7. Other: None. CT CHEST FINDINGS Cardiovascular: Normal heart size. No pericardial effusion. Normal caliber thoracic aorta. Mediastinum/Nodes: Trachea and esophagus are unremarkable. Right hilar lymphadenopathy. For example 1.3 cm lymph node on series 2/image 29 additional mediastinal lymphadenopathy  with a 1.2 cm left paratracheal node (series 2/image 25). Left suprahilar lymphadenopathy measuring 11 mm (series 2/image 24). These are enlarged compared to 11/28/2022. Lungs/Pleura: Chronic bronchial wall thickening and bronchiolectasis. Diffuse bilateral centrilobular micro nodules and tree-in-bud opacities are progressed compared to prior. No focal pneumonia. Centrilobular and paraseptal emphysema in the upper lobes. No pleural effusion or pneumothorax. Musculoskeletal: No acute fracture. CT ABDOMEN AND PELVIS FINDINGS Hepatobiliary: Hepatic steatosis. Cholecystectomy. No acute abnormality. Pancreas: Unremarkable. Spleen: Unremarkable. Adrenals/Urinary Tract: Chronic thickening of the left adrenal gland. Unremarkable right adrenal gland. No urinary calculi or hydronephrosis. Unremarkable bladder Stomach/Bowel: Normal caliber large and small bowel. Moderate stool in the right colon. No bowel wall thickening. The appendix is not visualized. Stomach is within normal limits. Duodenal diverticulum. Vascular/Lymphatic: Aortic atherosclerosis. No enlarged abdominal or pelvic lymph nodes. Reproductive: Hysterectomy.  No adnexal mass. Other: No free intraperitoneal fluid or air. Musculoskeletal: No acute fracture. IMPRESSION: 1. No acute intracranial abnormality. 2. No acute fracture or traumatic listhesis of the cervical spine. 3. No acute traumatic abnormality in the chest, abdomen, or pelvis. 4. Progressive bronchitis/bronchiolitis compared to 11/28/2022. 5. Mediastinal and bilateral hilar lymphadenopathy, likely reactive. Continued attention on follow-up. 6. Hepatic steatosis. Aortic Atherosclerosis (ICD10-I70.0) and Emphysema (ICD10-J43.9). Electronically Signed   By: Rozell Cornet M.D.   On: 06/20/2023 00:38   CT CHEST ABDOMEN PELVIS W CONTRAST Result Date: 06/20/2023 CLINICAL DATA:  Neck trauma, mental status change, fall, hypotension. Patient does not remember falling and is unsure if passed out. History of  COPD wearing 2 L of oxygen at baseline. 82% on room air. Sepsis. EXAM: CT HEAD WITHOUT CONTRAST CT CERVICAL SPINE WITHOUT CONTRAST CT CHEST, ABDOMEN AND PELVIS WITHOUT CONTRAST TECHNIQUE: Contiguous axial images were obtained from the base of the skull through the vertex without intravenous contrast. Multidetector CT imaging of the cervical spine was performed without intravenous contrast. Multiplanar CT image reconstructions were also generated. Multidetector CT imaging of the chest, abdomen and pelvis was performed following the standard protocol without IV contrast. RADIATION DOSE REDUCTION: This exam was performed according to the departmental dose-optimization program which includes automated exposure control, adjustment of the mA and/or kV according to patient size and/or use of iterative reconstruction technique. COMPARISON:  Chest radiograph 06/19/2023; CTA  chest 11/28/2022; CT abdomen pelvis 04/13/2016; CT head 09/26/2006 FINDINGS: CT HEAD FINDINGS Brain: No evidence of acute infarction, hemorrhage, hydrocephalus, extra-axial collection or mass lesion/mass effect. Vascular: No hyperdense vessel or unexpected calcification. Skull: Normal. Negative for fracture or focal lesion. Sinuses/Orbits: No acute finding. Other: None. CT CERVICAL FINDINGS Alignment: No evidence of traumatic listhesis. Skull base and vertebrae: No acute fracture. Soft tissues and spinal canal: No prevertebral fluid or swelling. No visible canal hematoma. Disc levels: Multilevel spondylosis, disc space height loss, and degenerative endplate changes greatest at C5-C6 and C6-C7 where it is moderate. Posterior disc osteophyte complex at C5-C6 and C6-C7 cause is moderate effacement of the thecal sac. Uncovertebral spurring and facet arthropathy causes multilevel neural foraminal narrowing greatest on the left at C5-C6 and bilaterally at C6-C7. Other: None. CT CHEST FINDINGS Cardiovascular: Normal heart size. No pericardial effusion. Normal  caliber thoracic aorta. Mediastinum/Nodes: Trachea and esophagus are unremarkable. Right hilar lymphadenopathy. For example 1.3 cm lymph node on series 2/image 29 additional mediastinal lymphadenopathy with a 1.2 cm left paratracheal node (series 2/image 25). Left suprahilar lymphadenopathy measuring 11 mm (series 2/image 24). These are enlarged compared to 11/28/2022. Lungs/Pleura: Chronic bronchial wall thickening and bronchiolectasis. Diffuse bilateral centrilobular micro nodules and tree-in-bud opacities are progressed compared to prior. No focal pneumonia. Centrilobular and paraseptal emphysema in the upper lobes. No pleural effusion or pneumothorax. Musculoskeletal: No acute fracture. CT ABDOMEN AND PELVIS FINDINGS Hepatobiliary: Hepatic steatosis. Cholecystectomy. No acute abnormality. Pancreas: Unremarkable. Spleen: Unremarkable. Adrenals/Urinary Tract: Chronic thickening of the left adrenal gland. Unremarkable right adrenal gland. No urinary calculi or hydronephrosis. Unremarkable bladder Stomach/Bowel: Normal caliber large and small bowel. Moderate stool in the right colon. No bowel wall thickening. The appendix is not visualized. Stomach is within normal limits. Duodenal diverticulum. Vascular/Lymphatic: Aortic atherosclerosis. No enlarged abdominal or pelvic lymph nodes. Reproductive: Hysterectomy.  No adnexal mass. Other: No free intraperitoneal fluid or air. Musculoskeletal: No acute fracture. IMPRESSION: 1. No acute intracranial abnormality. 2. No acute fracture or traumatic listhesis of the cervical spine. 3. No acute traumatic abnormality in the chest, abdomen, or pelvis. 4. Progressive bronchitis/bronchiolitis compared to 11/28/2022. 5. Mediastinal and bilateral hilar lymphadenopathy, likely reactive. Continued attention on follow-up. 6. Hepatic steatosis. Aortic Atherosclerosis (ICD10-I70.0) and Emphysema (ICD10-J43.9). Electronically Signed   By: Rozell Cornet M.D.   On: 06/20/2023 00:38   CT  Cervical Spine Wo Contrast Result Date: 06/20/2023 CLINICAL DATA:  Neck trauma, mental status change, fall, hypotension. Patient does not remember falling and is unsure if passed out. History of COPD wearing 2 L of oxygen at baseline. 82% on room air. Sepsis. EXAM: CT HEAD WITHOUT CONTRAST CT CERVICAL SPINE WITHOUT CONTRAST CT CHEST, ABDOMEN AND PELVIS WITHOUT CONTRAST TECHNIQUE: Contiguous axial images were obtained from the base of the skull through the vertex without intravenous contrast. Multidetector CT imaging of the cervical spine was performed without intravenous contrast. Multiplanar CT image reconstructions were also generated. Multidetector CT imaging of the chest, abdomen and pelvis was performed following the standard protocol without IV contrast. RADIATION DOSE REDUCTION: This exam was performed according to the departmental dose-optimization program which includes automated exposure control, adjustment of the mA and/or kV according to patient size and/or use of iterative reconstruction technique. COMPARISON:  Chest radiograph 06/19/2023; CTA chest 11/28/2022; CT abdomen pelvis 04/13/2016; CT head 09/26/2006 FINDINGS: CT HEAD FINDINGS Brain: No evidence of acute infarction, hemorrhage, hydrocephalus, extra-axial collection or mass lesion/mass effect. Vascular: No hyperdense vessel or unexpected calcification. Skull: Normal. Negative for fracture or focal  lesion. Sinuses/Orbits: No acute finding. Other: None. CT CERVICAL FINDINGS Alignment: No evidence of traumatic listhesis. Skull base and vertebrae: No acute fracture. Soft tissues and spinal canal: No prevertebral fluid or swelling. No visible canal hematoma. Disc levels: Multilevel spondylosis, disc space height loss, and degenerative endplate changes greatest at C5-C6 and C6-C7 where it is moderate. Posterior disc osteophyte complex at C5-C6 and C6-C7 cause is moderate effacement of the thecal sac. Uncovertebral spurring and facet arthropathy causes  multilevel neural foraminal narrowing greatest on the left at C5-C6 and bilaterally at C6-C7. Other: None. CT CHEST FINDINGS Cardiovascular: Normal heart size. No pericardial effusion. Normal caliber thoracic aorta. Mediastinum/Nodes: Trachea and esophagus are unremarkable. Right hilar lymphadenopathy. For example 1.3 cm lymph node on series 2/image 29 additional mediastinal lymphadenopathy with a 1.2 cm left paratracheal node (series 2/image 25). Left suprahilar lymphadenopathy measuring 11 mm (series 2/image 24). These are enlarged compared to 11/28/2022. Lungs/Pleura: Chronic bronchial wall thickening and bronchiolectasis. Diffuse bilateral centrilobular micro nodules and tree-in-bud opacities are progressed compared to prior. No focal pneumonia. Centrilobular and paraseptal emphysema in the upper lobes. No pleural effusion or pneumothorax. Musculoskeletal: No acute fracture. CT ABDOMEN AND PELVIS FINDINGS Hepatobiliary: Hepatic steatosis. Cholecystectomy. No acute abnormality. Pancreas: Unremarkable. Spleen: Unremarkable. Adrenals/Urinary Tract: Chronic thickening of the left adrenal gland. Unremarkable right adrenal gland. No urinary calculi or hydronephrosis. Unremarkable bladder Stomach/Bowel: Normal caliber large and small bowel. Moderate stool in the right colon. No bowel wall thickening. The appendix is not visualized. Stomach is within normal limits. Duodenal diverticulum. Vascular/Lymphatic: Aortic atherosclerosis. No enlarged abdominal or pelvic lymph nodes. Reproductive: Hysterectomy.  No adnexal mass. Other: No free intraperitoneal fluid or air. Musculoskeletal: No acute fracture. IMPRESSION: 1. No acute intracranial abnormality. 2. No acute fracture or traumatic listhesis of the cervical spine. 3. No acute traumatic abnormality in the chest, abdomen, or pelvis. 4. Progressive bronchitis/bronchiolitis compared to 11/28/2022. 5. Mediastinal and bilateral hilar lymphadenopathy, likely reactive.  Continued attention on follow-up. 6. Hepatic steatosis. Aortic Atherosclerosis (ICD10-I70.0) and Emphysema (ICD10-J43.9). Electronically Signed   By: Rozell Cornet M.D.   On: 06/20/2023 00:38   DG Chest Portable 1 View Result Date: 06/19/2023 CLINICAL DATA:  Shortness of breath EXAM: PORTABLE CHEST 1 VIEW COMPARISON:  11/28/2022 FINDINGS: Cardiac shadow is stable. The lungs are well aerated bilaterally. Mild central vascular congestion is noted without significant edema. No bony abnormality is noted. IMPRESSION: Mild vascular congestion without significant edema. Electronically Signed   By: Violeta Grey M.D.   On: 06/19/2023 23:09    Microbiology: Results for orders placed or performed during the hospital encounter of 06/19/23  Resp panel by RT-PCR (RSV, Flu A&B, Covid) Anterior Nasal Swab     Status: None   Collection Time: 06/19/23 10:56 PM   Specimen: Anterior Nasal Swab  Result Value Ref Range Status   SARS Coronavirus 2 by RT PCR NEGATIVE NEGATIVE Final    Comment: (NOTE) SARS-CoV-2 target nucleic acids are NOT DETECTED.  The SARS-CoV-2 RNA is generally detectable in upper respiratory specimens during the acute phase of infection. The lowest concentration of SARS-CoV-2 viral copies this assay can detect is 138 copies/mL. A negative result does not preclude SARS-Cov-2 infection and should not be used as the sole basis for treatment or other patient management decisions. A negative result may occur with  improper specimen collection/handling, submission of specimen other than nasopharyngeal swab, presence of viral mutation(s) within the areas targeted by this assay, and inadequate number of viral copies(<138 copies/mL). A negative result must  be combined with clinical observations, patient history, and epidemiological information. The expected result is Negative.  Fact Sheet for Patients:  BloggerCourse.com  Fact Sheet for Healthcare Providers:   SeriousBroker.it  This test is no t yet approved or cleared by the [place]  FDA and  has been authorized for detection and/or diagnosis of SARS-CoV-2 by FDA under an Emergency Use Authorization (EUA). This EUA will remain  in effect (meaning this test can be used) for the duration of the COVID-19 declaration under Section 564(b)(1) of the Act, 21 U.S.C.section 360bbb-3(b)(1), unless the authorization is terminated  or revoked sooner.       Influenza A by PCR NEGATIVE NEGATIVE Final   Influenza B by PCR NEGATIVE NEGATIVE Final    Comment: (NOTE) The Xpert Xpress SARS-CoV-2/FLU/RSV plus assay is intended as an aid in the diagnosis of influenza from Nasopharyngeal swab specimens and should not be used as a sole basis for treatment. Nasal washings and aspirates are unacceptable for Xpert Xpress SARS-CoV-2/FLU/RSV testing.  Fact Sheet for Patients: BloggerCourse.com  Fact Sheet for Healthcare Providers: SeriousBroker.it  This test is not yet approved or cleared by the [place]  FDA and has been authorized for detection and/or diagnosis of SARS-CoV-2 by FDA under an Emergency Use Authorization (EUA). This EUA will remain in effect (meaning this test can be used) for the duration of the COVID-19 declaration under Section 564(b)(1) of the Act, 21 U.S.C. section 360bbb-3(b)(1), unless the authorization is terminated or revoked.     Resp Syncytial Virus by PCR NEGATIVE NEGATIVE Final    Comment: (NOTE) Fact Sheet for Patients: BloggerCourse.com  Fact Sheet for Healthcare Providers: SeriousBroker.it  This test is not yet approved or cleared by the [place]  FDA and has been authorized for detection and/or diagnosis of SARS-CoV-2 by FDA under an Emergency Use Authorization (EUA). This EUA will remain in effect (meaning this test can be used) for  the duration of the COVID-19 declaration under Section 564(b)(1) of the Act, 21 U.S.C. section 360bbb-3(b)(1), unless the authorization is terminated or revoked.  Performed at [facility], [address] [place], [place] [address]   Blood Culture (routine x 2)     Status: None (Preliminary result)   Collection Time: 06/19/23 11:56 PM   Specimen: BLOOD LEFT HAND  Result Value Ref Range Status   Specimen Description BLOOD LEFT HAND  Final   Special Requests   Final    BOTTLES DRAWN AEROBIC AND ANAEROBIC Blood Culture adequate volume   Culture   Final    NO GROWTH 4 DAYS Performed at [facility], [address], [place], [place] [address]    Report Status PENDING  Incomplete  Blood Culture (routine x 2)     Status: None (Preliminary result)   Collection Time: 06/20/23 12:00 AM   Specimen: BLOOD RIGHT ARM  Result Value Ref Range Status   Specimen Description BLOOD RIGHT ARM  Final   Special Requests   Final    BOTTLES DRAWN AEROBIC AND ANAEROBIC Blood Culture adequate volume   Culture   Final    NO GROWTH 4 DAYS Performed at [facility], [address], [place], [place] [address]    Report Status PENDING  Incomplete  MRSA Next Gen by PCR, Nasal     Status: None   Collection Time: 06/21/23  5:58 PM   Specimen: Nasal Mucosa; Nasal Swab  Result Value Ref Range Status   MRSA by PCR Next Gen NOT DETECTED NOT DETECTED Final  Comment: (NOTE) The GeneXpert MRSA Assay (FDA approved for NASAL specimens only), is one component of a comprehensive MRSA colonization surveillance program. It is not intended to diagnose MRSA infection nor to guide or monitor treatment for MRSA infections. Test performance is not FDA approved in patients less than 66 years old. Performed at [facility], [address] [place], [place] [address]     Labs: CBC: Recent Labs  Lab 06/19/23 2255 06/21/23 0519 06/22/23 0547 06/23/23 0419 06/24/23 0447  WBC  17.4* 12.0* 9.2 5.5 7.1  NEUTROABS 13.9*  --   --  4.0 4.6  HGB 11.1* 9.1* 9.4* 9.0* 8.8*  HCT 36.4 31.1* 31.2* 29.6* 28.6*  MCV 87.3 90.1 89.7 86.8 85.9  PLT 301 253 244 229 265   Basic Metabolic Panel: Recent Labs  Lab 06/20/23 1310 06/21/23 0519 06/22/23 0547 06/23/23 0419 06/24/23 0447  NA 140 143 140 140 143  K 4.0 3.5 3.9 3.2* 2.9*  CL 92* 94* 89* 88* 92*  CO2 40* 38* 38* 39* 38*  GLUCOSE 175* 89 98 130* 86  BUN 21 18 17 15 17   CREATININE 0.93 0.75 0.72 0.71 0.76  CALCIUM 8.5* 8.8* 8.9 8.7* 8.6*  MG 1.7 2.0 1.6* 1.8 1.6*  PHOS 3.8 2.1* 3.2 2.8 1.5*   Liver Function Tests: Recent Labs  Lab 06/19/23 2255 06/23/23 0419 06/24/23 0447  AST 27 22 21   ALT 13 19 19   ALKPHOS 83 64 63  BILITOT 0.4 0.6 0.6  PROT 6.7 5.7* 5.3*  ALBUMIN 3.0* 2.5* 2.5*   CBG: Recent Labs  Lab 06/19/23 2258 06/23/23 1644 06/23/23 2048 06/24/23 0730 06/24/23 1153  GLUCAP 150* 281* 185* 101* 222*    Discharge time spent: 32 minutes.  Signed: Akesha Uresti, DO [facility] 06/24/2023

## 2023-06-24 NOTE — Progress Notes (Signed)
 Nutrition Follow-up  DOCUMENTATION CODES:   Not applicable  INTERVENTION:   -Continue regular diet -Continue MVI with minerals daily -Continue Ensure Plus High Protein po BID, each supplement provides 350 kcal and 20 grams of protein  -Continue 500 mg vitamin C  BID -Continue 220 gm zinc  sulfate daily x 14 days  NUTRITION DIAGNOSIS:   Increased nutrient needs related to wound healing as evidenced by estimated needs.  Ongoing  GOAL:   Patient will meet greater than or equal to 90% of their needs  Progressing   MONITOR:   PO intake, Supplement acceptance  REASON FOR ASSESSMENT:   Malnutrition Screening Tool    ASSESSMENT:   Pt with medical history significant for COPD on 2 L oxygen at night, HTN, HLD, prior stroke, depression with anxiety, presenting with lethargy resulting in a fall/presyncope  Reviewed I/O's: -60 ml x 24 hours and +3.3 L since admission  UOP: 300 ml x 24 hours  Per [facility] notes, pt with two stage 3 pressure injuries to sacrum.   Spoke with pt at bedside, who was pleasant and in good spirits today. Pt is much more conversant in comparison to last visit. She reports feeling better and appetite has improved. Pt consuming breakfast without difficulty. Noted meal completions 90-100%. Pt also has been drinking Ensure supplements both here and at home.   Discussed importance of good meal and supplement intake to promote healing.   Per MD notes, possible discharge home today.   Medications reviewed and include vitamin C , buspar , solu-medrol , phosphorus, potassium chloride , effexor , magnesium  oxide, and zinc  sulfate.   Labs reviewed: K: 2.9, Phos: 1.5, Mg: 1.6 ON supplementation), CBGS: 101-281 (inpatient orders for glycemic control are 0-5 units insulin  aspart daily at bedtime and 0-9 units insulin  aspart TID with meals). Tox screen positive for benzodiazepines. Vitamin A still pending.   Diet Order:   Diet Order             Diet regular Room service  appropriate? Yes; Fluid consistency: Thin  Diet effective now                   EDUCATION NEEDS:   Education needs have been addressed  Skin:  Skin Assessment: Skin Integrity Issues: Skin Integrity Issues:: Stage III Stage III: sacrum x 2  Last BM:  06/23/23 (type 7)  Height:   Ht Readings from Last 1 Encounters:  06/20/23 5' 1 (1.549 m)    Weight:   Wt Readings from Last 1 Encounters:  06/20/23 56.6 kg    Ideal Body Weight:  47.7 kg  BMI:  Body mass index is 23.58 kg/m.  Estimated Nutritional Needs:   Kcal:  1700-1900  Protein:  90-105 grams  Fluid:  1.7-1.9 L    Herschel Lords, RD, LDN, CDCES Registered Dietitian III Certified Diabetes Care and Education Specialist If unable to reach this RD, please use RD Inpatient group chat on secure chat between hours of 8am-4 pm daily

## 2023-06-24 NOTE — Plan of Care (Signed)
  Problem: Fluid Volume: Goal: Hemodynamic stability will improve Outcome: Progressing   Problem: Clinical Measurements: Goal: Diagnostic test results will improve Outcome: Progressing Goal: Signs and symptoms of infection will decrease Outcome: Progressing   Problem: Respiratory: Goal: Ability to maintain adequate ventilation will improve Outcome: Progressing   Problem: Education: Goal: Knowledge of General Education information will improve Description: Including pain rating scale, medication(s)/side effects and non-pharmacologic comfort measures Outcome: Progressing   Problem: Health Behavior/Discharge Planning: Goal: Ability to manage health-related needs will improve Outcome: Progressing   Problem: Clinical Measurements: Goal: Ability to maintain clinical measurements within normal limits will improve Outcome: Progressing Goal: Will remain free from infection Outcome: Progressing Goal: Diagnostic test results will improve Outcome: Progressing Goal: Respiratory complications will improve Outcome: Progressing Goal: Cardiovascular complication will be avoided Outcome: Progressing   Problem: Activity: Goal: Risk for activity intolerance will decrease Outcome: Progressing   Problem: Nutrition: Goal: Adequate nutrition will be maintained Outcome: Progressing   Problem: Coping: Goal: Level of anxiety will decrease Outcome: Progressing   Problem: Elimination: Goal: Will not experience complications related to bowel motility Outcome: Progressing Goal: Will not experience complications related to urinary retention Outcome: Progressing   Problem: Pain Managment: Goal: General experience of comfort will improve and/or be controlled Outcome: Progressing   Problem: Safety: Goal: Ability to remain free from injury will improve Outcome: Progressing   Problem: Skin Integrity: Goal: Risk for impaired skin integrity will decrease Outcome: Progressing   Problem:  Education: Goal: Ability to describe self-care measures that may prevent or decrease complications (Diabetes Survival Skills Education) will improve Outcome: Progressing Goal: Individualized Educational Video(s) Outcome: Progressing   Problem: Coping: Goal: Ability to adjust to condition or change in health will improve Outcome: Progressing

## 2023-06-25 LAB — CULTURE, BLOOD (ROUTINE X 2)
Culture: NO GROWTH
Culture: NO GROWTH
Special Requests: ADEQUATE
Special Requests: ADEQUATE

## 2023-06-27 LAB — VITAMIN A: Vitamin A (Retinoic Acid): 20.5 ug/dL — ABNORMAL LOW (ref 22.0–69.5)

## 2024-02-11 ENCOUNTER — Emergency Department

## 2024-02-11 ENCOUNTER — Other Ambulatory Visit: Payer: Self-pay

## 2024-02-11 ENCOUNTER — Observation Stay
Admission: EM | Admit: 2024-02-11 | Discharge: 2024-02-14 | DRG: 682 | Disposition: A | Attending: Internal Medicine | Admitting: Internal Medicine

## 2024-02-11 DIAGNOSIS — R4182 Altered mental status, unspecified: Principal | ICD-10-CM

## 2024-02-11 DIAGNOSIS — Z79899 Other long term (current) drug therapy: Secondary | ICD-10-CM

## 2024-02-11 DIAGNOSIS — R55 Syncope and collapse: Secondary | ICD-10-CM | POA: Diagnosis present

## 2024-02-11 DIAGNOSIS — Z9981 Dependence on supplemental oxygen: Secondary | ICD-10-CM

## 2024-02-11 DIAGNOSIS — E785 Hyperlipidemia, unspecified: Secondary | ICD-10-CM | POA: Diagnosis present

## 2024-02-11 DIAGNOSIS — R7989 Other specified abnormal findings of blood chemistry: Secondary | ICD-10-CM | POA: Diagnosis present

## 2024-02-11 DIAGNOSIS — E861 Hypovolemia: Secondary | ICD-10-CM | POA: Diagnosis present

## 2024-02-11 DIAGNOSIS — E876 Hypokalemia: Secondary | ICD-10-CM | POA: Diagnosis present

## 2024-02-11 DIAGNOSIS — Z7951 Long term (current) use of inhaled steroids: Secondary | ICD-10-CM

## 2024-02-11 DIAGNOSIS — I10 Essential (primary) hypertension: Secondary | ICD-10-CM | POA: Diagnosis present

## 2024-02-11 DIAGNOSIS — G250 Essential tremor: Secondary | ICD-10-CM | POA: Diagnosis present

## 2024-02-11 DIAGNOSIS — F17200 Nicotine dependence, unspecified, uncomplicated: Secondary | ICD-10-CM | POA: Diagnosis present

## 2024-02-11 DIAGNOSIS — J449 Chronic obstructive pulmonary disease, unspecified: Secondary | ICD-10-CM | POA: Diagnosis present

## 2024-02-11 DIAGNOSIS — F419 Anxiety disorder, unspecified: Secondary | ICD-10-CM | POA: Diagnosis present

## 2024-02-11 DIAGNOSIS — J9611 Chronic respiratory failure with hypoxia: Secondary | ICD-10-CM | POA: Diagnosis present

## 2024-02-11 DIAGNOSIS — J4489 Other specified chronic obstructive pulmonary disease: Secondary | ICD-10-CM | POA: Diagnosis present

## 2024-02-11 DIAGNOSIS — R296 Repeated falls: Secondary | ICD-10-CM | POA: Diagnosis present

## 2024-02-11 DIAGNOSIS — R748 Abnormal levels of other serum enzymes: Secondary | ICD-10-CM | POA: Diagnosis present

## 2024-02-11 DIAGNOSIS — G9340 Encephalopathy, unspecified: Secondary | ICD-10-CM | POA: Diagnosis present

## 2024-02-11 DIAGNOSIS — F32A Depression, unspecified: Secondary | ICD-10-CM | POA: Diagnosis present

## 2024-02-11 DIAGNOSIS — E119 Type 2 diabetes mellitus without complications: Secondary | ICD-10-CM | POA: Diagnosis present

## 2024-02-11 DIAGNOSIS — G928 Other toxic encephalopathy: Secondary | ICD-10-CM | POA: Diagnosis present

## 2024-02-11 DIAGNOSIS — W19XXXA Unspecified fall, initial encounter: Secondary | ICD-10-CM | POA: Diagnosis present

## 2024-02-11 DIAGNOSIS — E860 Dehydration: Secondary | ICD-10-CM | POA: Diagnosis present

## 2024-02-11 DIAGNOSIS — Z7984 Long term (current) use of oral hypoglycemic drugs: Secondary | ICD-10-CM

## 2024-02-11 DIAGNOSIS — Z882 Allergy status to sulfonamides status: Secondary | ICD-10-CM

## 2024-02-11 DIAGNOSIS — N179 Acute kidney failure, unspecified: Principal | ICD-10-CM | POA: Diagnosis present

## 2024-02-11 DIAGNOSIS — Y92009 Unspecified place in unspecified non-institutional (private) residence as the place of occurrence of the external cause: Secondary | ICD-10-CM

## 2024-02-11 DIAGNOSIS — F17210 Nicotine dependence, cigarettes, uncomplicated: Secondary | ICD-10-CM | POA: Diagnosis present

## 2024-02-11 DIAGNOSIS — Z8673 Personal history of transient ischemic attack (TIA), and cerebral infarction without residual deficits: Secondary | ICD-10-CM

## 2024-02-11 DIAGNOSIS — K219 Gastro-esophageal reflux disease without esophagitis: Secondary | ICD-10-CM | POA: Diagnosis present

## 2024-02-11 LAB — COMPREHENSIVE METABOLIC PANEL WITH GFR
ALT: 47 U/L — ABNORMAL HIGH (ref 0–44)
AST: 60 U/L — ABNORMAL HIGH (ref 15–41)
Albumin: 3.5 g/dL (ref 3.5–5.0)
Alkaline Phosphatase: 119 U/L (ref 38–126)
Anion gap: 12 (ref 5–15)
BUN: 47 mg/dL — ABNORMAL HIGH (ref 8–23)
CO2: 33 mmol/L — ABNORMAL HIGH (ref 22–32)
Calcium: 9.2 mg/dL (ref 8.9–10.3)
Chloride: 97 mmol/L — ABNORMAL LOW (ref 98–111)
Creatinine, Ser: 1.37 mg/dL — ABNORMAL HIGH (ref 0.44–1.00)
GFR, Estimated: 42 mL/min — ABNORMAL LOW
Glucose, Bld: 167 mg/dL — ABNORMAL HIGH (ref 70–99)
Potassium: 3 mmol/L — ABNORMAL LOW (ref 3.5–5.1)
Sodium: 142 mmol/L (ref 135–145)
Total Bilirubin: <0.2 mg/dL (ref 0.0–1.2)
Total Protein: 6.9 g/dL (ref 6.5–8.1)

## 2024-02-11 LAB — URINALYSIS, ROUTINE W REFLEX MICROSCOPIC
Bacteria, UA: NONE SEEN
Bilirubin Urine: NEGATIVE
Glucose, UA: NEGATIVE mg/dL
Ketones, ur: 5 mg/dL — AB
Leukocytes,Ua: NEGATIVE
Nitrite: NEGATIVE
Protein, ur: 30 mg/dL — AB
Specific Gravity, Urine: 1.019 (ref 1.005–1.030)
pH: 5 (ref 5.0–8.0)

## 2024-02-11 LAB — CBC
HCT: 32.1 % — ABNORMAL LOW (ref 36.0–46.0)
Hemoglobin: 9.9 g/dL — ABNORMAL LOW (ref 12.0–15.0)
MCH: 26.3 pg (ref 26.0–34.0)
MCHC: 30.8 g/dL (ref 30.0–36.0)
MCV: 85.4 fL (ref 80.0–100.0)
Platelets: 388 10*3/uL (ref 150–400)
RBC: 3.76 MIL/uL — ABNORMAL LOW (ref 3.87–5.11)
RDW: 14 % (ref 11.5–15.5)
WBC: 13.2 10*3/uL — ABNORMAL HIGH (ref 4.0–10.5)
nRBC: 0 % (ref 0.0–0.2)

## 2024-02-11 LAB — MAGNESIUM: Magnesium: 1 mg/dL — ABNORMAL LOW (ref 1.7–2.4)

## 2024-02-11 LAB — CBG MONITORING, ED
Glucose-Capillary: 138 mg/dL — ABNORMAL HIGH (ref 70–99)
Glucose-Capillary: 142 mg/dL — ABNORMAL HIGH (ref 70–99)

## 2024-02-11 LAB — CK: Total CK: 650 U/L — ABNORMAL HIGH (ref 38–234)

## 2024-02-11 MED ORDER — LACTATED RINGERS IV SOLN: INTRAVENOUS | Status: AC

## 2024-02-11 MED ORDER — TRAZODONE HCL 50 MG PO TABS
300.0000 mg | ORAL_TABLET | Freq: Every day | ORAL | Status: DC
  Administered 2024-02-11 – 2024-02-13 (×3): 300 mg via ORAL
  Filled 2024-02-11 (×2): qty 6
  Filled 2024-02-11: qty 3

## 2024-02-11 MED ORDER — FLUTICASONE FUROATE-VILANTEROL 200-25 MCG/ACT IN AEPB
1.0000 | INHALATION_SPRAY | Freq: Every day | RESPIRATORY_TRACT | Status: DC
  Administered 2024-02-12 – 2024-02-14 (×3): 1 via RESPIRATORY_TRACT
  Filled 2024-02-11: qty 28

## 2024-02-11 MED ORDER — IPRATROPIUM-ALBUTEROL 0.5-2.5 (3) MG/3ML IN SOLN
3.0000 mL | Freq: Once | RESPIRATORY_TRACT | Status: AC
  Administered 2024-02-11: 3 mL via RESPIRATORY_TRACT
  Filled 2024-02-11: qty 3

## 2024-02-11 MED ORDER — SODIUM CHLORIDE 0.9 % IV BOLUS
1000.0000 mL | Freq: Once | INTRAVENOUS | Status: AC
  Administered 2024-02-11: 1000 mL via INTRAVENOUS

## 2024-02-11 MED ORDER — IPRATROPIUM-ALBUTEROL 0.5-2.5 (3) MG/3ML IN SOLN
3.0000 mL | Freq: Four times a day (QID) | RESPIRATORY_TRACT | Status: DC | PRN
  Administered 2024-02-11 – 2024-02-13 (×2): 3 mL via RESPIRATORY_TRACT
  Filled 2024-02-11 (×2): qty 3

## 2024-02-11 MED ORDER — HEPARIN SODIUM (PORCINE) 5000 UNIT/ML IJ SOLN
5000.0000 [IU] | Freq: Three times a day (TID) | INTRAMUSCULAR | Status: DC
  Administered 2024-02-11 – 2024-02-14 (×8): 5000 [IU] via SUBCUTANEOUS
  Filled 2024-02-11 (×8): qty 1

## 2024-02-11 MED ORDER — ACETAMINOPHEN 325 MG PO TABS: 650.0000 mg | ORAL_TABLET | Freq: Four times a day (QID) | ORAL | Status: DC | PRN

## 2024-02-11 MED ORDER — ACETAMINOPHEN 650 MG RE SUPP: 650.0000 mg | Freq: Four times a day (QID) | RECTAL | Status: DC | PRN

## 2024-02-11 MED ORDER — LINACLOTIDE 145 MCG PO CAPS
290.0000 ug | ORAL_CAPSULE | Freq: Every day | ORAL | Status: DC
  Administered 2024-02-12: 290 ug via ORAL
  Filled 2024-02-11 (×2): qty 2

## 2024-02-11 MED ORDER — UMECLIDINIUM BROMIDE 62.5 MCG/ACT IN AEPB
1.0000 | INHALATION_SPRAY | Freq: Every day | RESPIRATORY_TRACT | Status: DC
  Administered 2024-02-12 – 2024-02-14 (×3): 1 via RESPIRATORY_TRACT
  Filled 2024-02-11: qty 7

## 2024-02-11 MED ORDER — POTASSIUM CHLORIDE 20 MEQ PO PACK
60.0000 meq | PACK | Freq: Once | ORAL | Status: AC
  Administered 2024-02-11: 60 meq via ORAL
  Filled 2024-02-11: qty 3

## 2024-02-11 MED ORDER — TIOTROPIUM BROMIDE 18 MCG IN CAPS: 1.0000 | ORAL_CAPSULE | Freq: Every day | RESPIRATORY_TRACT | Status: DC

## 2024-02-11 MED ORDER — POTASSIUM CHLORIDE CRYS ER 20 MEQ PO TBCR
40.0000 meq | EXTENDED_RELEASE_TABLET | Freq: Once | ORAL | Status: AC
  Administered 2024-02-11: 40 meq via ORAL
  Filled 2024-02-11: qty 2

## 2024-02-11 MED ORDER — PROPRANOLOL HCL 40 MG PO TABS
120.0000 mg | ORAL_TABLET | Freq: Two times a day (BID) | ORAL | Status: DC
  Administered 2024-02-11 – 2024-02-14 (×6): 120 mg via ORAL
  Filled 2024-02-11: qty 6
  Filled 2024-02-11 (×2): qty 3
  Filled 2024-02-11: qty 6
  Filled 2024-02-11 (×2): qty 3

## 2024-02-11 MED ORDER — SODIUM CHLORIDE 0.9% FLUSH
3.0000 mL | Freq: Two times a day (BID) | INTRAVENOUS | Status: DC
  Administered 2024-02-11 – 2024-02-14 (×6): 3 mL via INTRAVENOUS

## 2024-02-11 MED ORDER — MAGNESIUM SULFATE 2 GM/50ML IV SOLN
2.0000 g | Freq: Once | INTRAVENOUS | Status: AC
  Administered 2024-02-11: 2 g via INTRAVENOUS
  Filled 2024-02-11: qty 50

## 2024-02-11 MED ORDER — ONDANSETRON HCL 4 MG PO TABS: 4.0000 mg | ORAL_TABLET | Freq: Four times a day (QID) | ORAL | Status: DC | PRN

## 2024-02-11 MED ORDER — INSULIN ASPART 100 UNIT/ML IJ SOLN
0.0000 [IU] | Freq: Three times a day (TID) | INTRAMUSCULAR | Status: DC
  Administered 2024-02-13: 2 [IU] via SUBCUTANEOUS
  Administered 2024-02-13: 1 [IU] via SUBCUTANEOUS
  Filled 2024-02-11: qty 2
  Filled 2024-02-11: qty 1

## 2024-02-11 MED ORDER — ONDANSETRON HCL 4 MG/2ML IJ SOLN: 4.0000 mg | Freq: Four times a day (QID) | INTRAMUSCULAR | Status: DC | PRN

## 2024-02-11 MED ORDER — VENLAFAXINE HCL ER 75 MG PO CP24
300.0000 mg | ORAL_CAPSULE | Freq: Every day | ORAL | Status: DC
  Administered 2024-02-12 – 2024-02-14 (×3): 300 mg via ORAL
  Filled 2024-02-11: qty 4
  Filled 2024-02-11 (×2): qty 2
  Filled 2024-02-11: qty 4

## 2024-02-11 MED ORDER — SENNOSIDES-DOCUSATE SODIUM 8.6-50 MG PO TABS: 1.0000 | ORAL_TABLET | Freq: Every evening | ORAL | Status: DC | PRN

## 2024-02-11 MED ORDER — LORATADINE 10 MG PO TABS: 10.0000 mg | ORAL_TABLET | Freq: Every day | ORAL | Status: DC | PRN

## 2024-02-11 MED ORDER — MONTELUKAST SODIUM 10 MG PO TABS
10.0000 mg | ORAL_TABLET | Freq: Every day | ORAL | Status: DC
  Administered 2024-02-11 – 2024-02-13 (×3): 10 mg via ORAL
  Filled 2024-02-11 (×3): qty 1

## 2024-02-11 MED ORDER — BUSPIRONE HCL 5 MG PO TABS
15.0000 mg | ORAL_TABLET | Freq: Two times a day (BID) | ORAL | Status: DC
  Administered 2024-02-11 – 2024-02-14 (×6): 15 mg via ORAL
  Filled 2024-02-11: qty 2
  Filled 2024-02-11: qty 3
  Filled 2024-02-11 (×3): qty 2
  Filled 2024-02-11: qty 3

## 2024-02-11 MED ORDER — PANTOPRAZOLE SODIUM 40 MG PO TBEC
40.0000 mg | DELAYED_RELEASE_TABLET | Freq: Every day | ORAL | Status: DC
  Administered 2024-02-11 – 2024-02-14 (×4): 40 mg via ORAL
  Filled 2024-02-11 (×4): qty 1

## 2024-02-11 NOTE — ED Provider Notes (Signed)
 "  [facility] Provider Note    Event Date/Time   First MD Initiated Contact with Patient 02/11/24 1237     (approximate)   History   Fall   HPI  Sheri Barron is a 69 y.o. female with past medical history of COPD, hypertension, diabetes, presenting to the emergency department via EMS from home after a fall yesterday.  Patient's granddaughter called EMS due to concerns for the patient.  The patient reported to the granddaughter that she did not recall how she fell.  She is uncertain how long she was on the ground for.  The patient had told family that her husband stayed with her throughout the night however her husband passed several years ago.  She is complaining of pain to the left side of her head, bilateral shoulders, back, and left hip.     Physical Exam   Triage Vital Signs: ED Triage Vitals  Encounter Vitals Group     BP 02/11/24 1245 136/80     Girls Systolic BP Percentile --      Girls Diastolic BP Percentile --      Boys Systolic BP Percentile --      Boys Diastolic BP Percentile --      Pulse Rate 02/11/24 1245 (!) 108     Resp 02/11/24 1245 19     Temp 02/11/24 1246 97.6 F (36.4 C)     Temp src --      SpO2 02/11/24 1245 99 %     Weight --      Height --      Head Circumference --      Peak Flow --      Pain Score 02/11/24 1258 7     Pain Loc --      Pain Education --      Exclude from Growth Chart --     Most recent vital signs: Vitals:   02/11/24 1330 02/11/24 1345  BP: 126/77   Pulse: (!) 104 100  Resp: 20   Temp:    SpO2: 100% 98%     General: Awake, no distress.  CV:  Good peripheral perfusion.  Resp:  Normal effort.  Abd:  No distention.  Other:     ED Results / Procedures / Treatments   Labs (all labs ordered are listed, but only abnormal results are displayed) Labs Reviewed  COMPREHENSIVE METABOLIC PANEL WITH GFR - Abnormal; Notable for the following components:      Result Value   Potassium 3.0 (*)     Chloride 97 (*)    CO2 33 (*)    Glucose, Bld 167 (*)    BUN 47 (*)    Creatinine, Ser 1.37 (*)    AST 60 (*)    ALT 47 (*)    GFR, Estimated 42 (*)    All other components within normal limits  CBC - Abnormal; Notable for the following components:   WBC 13.2 (*)    RBC 3.76 (*)    Hemoglobin 9.9 (*)    HCT 32.1 (*)    All other components within normal limits  CBG MONITORING, ED - Abnormal; Notable for the following components:   Glucose-Capillary 138 (*)    All other components within normal limits  URINALYSIS, ROUTINE W REFLEX MICROSCOPIC  CK     EKG  ED ECG REPORT I, Reche CHRISTELLA Leventhal, the attending physician, personally viewed and interpreted this ECG.  Date: 02/11/2024 Rate: 106 bpm Rhythm: Sinus  tachycardia QRS Axis: Right axis deviation Intervals: normal ST/T Wave abnormalities: normal Narrative Interpretation: no evidence of acute ischemia    RADIOLOGY     PROCEDURES:  Critical Care performed: No  Procedures   MEDICATIONS ORDERED IN ED: Medications - No data to display   IMPRESSION / MDM / ASSESSMENT AND PLAN / ED COURSE  I reviewed the triage vital signs and the nursing notes.                              Differential diagnosis includes, but is not limited to, alcohol , illicit or prescription medications, or other toxic ingestion; intracranial pathology such as stroke or intracerebral hemorrhage; fever or infectious causes including sepsis; hypoxemia and/or hypercarbia; uremia; trauma; endocrine related disorders such as diabetes, hypoglycemia, and thyroid-related diseases; hypertensive encephalopathy; etc.   Patient's presentation is most consistent with acute presentation with potential threat to life or bodily function.  Patient is a 69 year old female presenting to the emergency department via EMS  past medical history of COPD, hypertension, diabetes, presenting to the emergency department via EMS from home after a fall yesterday.   Imaging of her head, neck, chest, hip, and lower back was performed and was unremarkable.  On her lab work she has a elevated CK as well as an AKI.  Her potassium was also noted to be low which does appear chronic for her.  IV fluids and potassium ordered.  Patient  Patient's care was signed out to the oncoming physician pending completion of workup with plan for admission for further workup and treatment.  Clinical Course as of 02/11/24 1543  Sat Feb 11, 2024  1526 DG Lumbar Spine 2-3 Views IMPRESSION: 1. No acute fracture or subluxation of the lumbar spine. If persistent clinical concern for traumatic injury, recommend dedicated cross-sectional imaging. 2. Mild degenerative changes of the lumbar spine.   [TT]  1526 CT Cervical Spine Wo Contrast IMPRESSION: 1. No acute fracture or traumatic subluxation. 2. Marked severity multilevel degenerative changes, as described above. 3. Emphysematous lung disease with mild to moderate severity areas of scarring and/or atelectasis within the bilateral upper lobes.   [TT]  1527 DG Hip Unilat W or Wo Pelvis 2-3 Views Left IMPRESSION: 1. No acute fracture or dislocation. 2. If there is a persistent clinical concern for nondisplaced hip or pelvic fracture, recommend dedicated pelvic CT or MRI.   [TT]  1527 DG Chest 2 View No acute cardiopulmonary abnormality.  [TT]  1527 CT Head Wo Contrast No acute intracranial abnormality.  [TT]    Clinical Course User Index [TT] [place], Lorelle Cummins, MD     FINAL CLINICAL IMPRESSION(S) / ED DIAGNOSES   Final diagnoses:  AKI (acute kidney injury)  Altered mental status, unspecified altered mental status type     Rx / DC Orders   ED Discharge Orders     None        Note:  This document was prepared using Dragon voice recognition software and may include unintentional dictation errors.   [place] Reche HERO, MD 02/11/24 1544  "

## 2024-02-11 NOTE — H&P (Addendum)
 " History and Physical    ALIZEY NOREN FMW:[id number] DOB: 1955-07-03 DOA: 02/11/2024  DOS: the patient was seen and examined on 02/11/2024  PCP: Adina Buel HERO, MD   Patient coming from: Home  I have personally briefly reviewed patient's old medical records in [facility] and CareEverywhere  HPI:   MARLEY PAKULA is a 69 y.o. year old female with medical history of hypertension, hyperlipidemia, type 2 diabetes, chronic hypoxic respiratory failure on 2 L nasal cannula secondary to COPD, tobacco use disorder, history of stroke presented to the ED after being found down.  Patient had a fall and she is unable to recall exact details of the fall.  There is appears to be a recurrent issue for the patient as she had an admission for a fall in 06/2023 where she was unable to recall details of the form.  No loss of consciousness reported.  Denies any fevers or chills.  Denies any URI symptoms. On arrival to the ED patient was noted to be HDS stable.  Lab work and imaging obtained.  CBC with mild leukocytosis 13.2, mild anemia above baseline at 9.9 that is normocytic.  CMP with moderate hypokalemia of 3.0, AKI, slight elevation of AST and ALT.  UA without signs of infection but there is presence of ketones and hemoglobin.  CK mildly elevated at 650.  Trauma imaging including CT head, cervical spine, plain radiography of the lumbar chest and hips without any acute findings.  Given patient confused and lives alone, [facility] contacted for admission.  Review of Systems: As mentioned in the history of present illness. All other systems reviewed and are negative.   Past Medical History:  Diagnosis Date   Anxiety    Asthma    Chronic constipation    COPD (chronic obstructive pulmonary disease) (HCC)    Depression    Diabetes mellitus without complication (HCC)    GERD (gastroesophageal reflux disease)    Hypertension    Stroke [facility])     Past Surgical History:  Procedure Laterality Date    ABDOMINAL HYSTERECTOMY     APPENDECTOMY     CHOLECYSTECTOMY     COLONOSCOPY WITH PROPOFOL  N/A 09/04/2014   Procedure: COLONOSCOPY WITH PROPOFOL ;  Surgeon: Lamar ONEIDA Holmes, MD;  Location: [facility] ENDOSCOPY;  Service: Endoscopy;  Laterality: N/A;   COLONOSCOPY WITH PROPOFOL  N/A 10/21/2017   Procedure: COLONOSCOPY WITH PROPOFOL ;  Surgeon: Holmes Lamar ONEIDA, MD;  Location: [facility] ENDOSCOPY;  Service: Endoscopy;  Laterality: N/A;   EGD-colonsocopy     ESOPHAGOGASTRODUODENOSCOPY (EGD) WITH PROPOFOL  N/A 09/04/2014   Procedure: ESOPHAGOGASTRODUODENOSCOPY (EGD) WITH PROPOFOL ;  Surgeon: Lamar ONEIDA Holmes, MD;  Location: [facility] ENDOSCOPY;  Service: Endoscopy;  Laterality: N/A;   SAVORY DILATION N/A 09/04/2014   Procedure: SAVORY DILATION;  Surgeon: Lamar ONEIDA Holmes, MD;  Location: [facility] ENDOSCOPY;  Service: Endoscopy;  Laterality: N/A;     Allergies[1]  Family History  Problem Relation Age of Onset   Breast cancer Neg Hx     Prior to Admission medications  Medication Sig Start Date End Date Taking? Authorizing Provider  albuterol  (PROVENTIL ) (2.5 MG/3ML) 0.083% nebulizer solution Take 2.5 mg by nebulization every 6 (six) hours as needed.    [provider]  albuterol  (VENTOLIN  HFA) 108 (90 Base) MCG/ACT inhaler Inhale 2 puffs into the lungs every 6 (six) hours as needed for wheezing or shortness of breath. 06/24/23 09/22/23  Dezii, Alexandra, DO  baclofen  (LIORESAL ) 10 MG tablet Take 10 mg by mouth 3 (three) times  daily. 11/08/22   [provider]  busPIRone  (BUSPAR ) 15 MG tablet Take 15 mg by mouth 2 (two) times daily.    [provider]  dextromethorphan -guaiFENesin  (MUCINEX  DM) 30-600 MG 12hr tablet Take 2 tablets by mouth 2 (two) times daily as needed for cough. 12/04/22   Alexander, Natalie, DO  fluticasone  (FLONASE ) 50 MCG/ACT nasal spray Place 1 spray into both nostrils 2 (two) times daily.     [provider]  fluticasone -salmeterol (ADVAIR ) 250-50 MCG/ACT AEPB  Inhale 1 puff into the lungs 2 (two) times daily. 06/24/23 09/22/23  Dezii, Alexandra, DO  lidocaine  (LIDODERM ) 5 % Place 1 patch onto the skin daily as needed. For pain    [provider]  LINZESS  290 MCG CAPS capsule Take 290 mcg by mouth daily. 05/13/23   [provider]  lisinopril  (ZESTRIL ) 30 MG tablet Take 1 tablet (30 mg total) by mouth daily. 06/25/23 07/25/23  Dezii, Alexandra, DO  loratadine  (CLARITIN ) 10 MG tablet Take 10 mg by mouth daily.    [provider]  metFORMIN  (GLUCOPHAGE ) 500 MG tablet Take 500 mg by mouth 2 (two) times daily with a meal.    [provider]  nicotine  (NICODERM CQ  - DOSED IN MG/24 HOURS) 21 mg/24hr patch Place 1 patch (21 mg total) onto the skin daily. 12/05/22   Alexander, Natalie, DO  omeprazole (PRILOSEC) 40 MG capsule Take 40 mg by mouth daily. 05/13/23   [provider]  polyethylene glycol (MIRALAX  / GLYCOLAX ) packet Take 17 g by mouth at bedtime.    [provider]  tiotropium (SPIRIVA ) 18 MCG inhalation capsule Place 1 capsule (18 mcg total) into inhaler and inhale daily. 06/24/23 07/24/23  Dezii, Alexandra, DO  traZODone  (DESYREL ) 100 MG tablet Take 300 mg by mouth at bedtime.     [provider]  venlafaxine  XR (EFFEXOR -XR) 150 MG 24 hr capsule Take 150 mg by mouth daily with breakfast.    [provider]    Social History:  reports that she has been smoking cigarettes. She has never used smokeless tobacco. She reports current drug use. Drug: Marijuana. She reports that she does not drink alcohol .    Physical Exam: Vitals:   02/11/24 1345 02/11/24 1630 02/11/24 1700 02/11/24 1801  BP:  (!) 108/58 126/68   Pulse: 100 99 (!) 110   Resp:   18   Temp:    97.9 F (36.6 C)  SpO2: 98% 100% 93%     Gen: NAD HENT: NCAT CV: Tachycardic rate, sinus rhythm Lung: CTAB Abd: No TTP, normal bowel sounds MSK: No asymmetry, decreased bulk and tone Neuro: alert and oriented x 4   Labs on  Admission: I have personally reviewed following labs and imaging studies  CBC: Recent Labs  Lab 02/11/24 1245  WBC 13.2*  HGB 9.9*  HCT 32.1*  MCV 85.4  PLT 388   Basic Metabolic Panel: Recent Labs  Lab 02/11/24 1245  NA 142  K 3.0*  CL 97*  CO2 33*  GLUCOSE 167*  BUN 47*  CREATININE 1.37*  CALCIUM  9.2   GFR: CrCl cannot be calculated (Unknown ideal weight.). Liver Function Tests: Recent Labs  Lab 02/11/24 1245  AST 60*  ALT 47*  ALKPHOS 119  BILITOT <0.2  PROT 6.9  ALBUMIN 3.5   No results for input(s): LIPASE, AMYLASE in the last 168 hours. No results for input(s): AMMONIA in the last 168 hours. Coagulation Profile: No results for input(s): INR, PROTIME in the last  168 hours. Cardiac Enzymes: Recent Labs  Lab 02/11/24 1245  CKTOTAL 650*   BNP (last 3 results) Recent Labs    06/19/23 2306  BNP 51.8   HbA1C: No results for input(s): HGBA1C in the last 72 hours. CBG: Recent Labs  Lab 02/11/24 1253  GLUCAP 138*   Lipid Profile: No results for input(s): CHOL, HDL, LDLCALC, TRIG, CHOLHDL, LDLDIRECT in the last 72 hours. Thyroid Function Tests: No results for input(s): TSH, T4TOTAL, FREET4, T3FREE, THYROIDAB in the last 72 hours. Anemia Panel: No results for input(s): VITAMINB12, FOLATE, FERRITIN, TIBC, IRON, RETICCTPCT in the last 72 hours. Urine analysis:    Component Value Date/Time   COLORURINE YELLOW (A) 02/11/2024 1245   APPEARANCEUR HAZY (A) 02/11/2024 1245   LABSPEC 1.019 02/11/2024 1245   PHURINE 5.0 02/11/2024 1245   GLUCOSEU NEGATIVE 02/11/2024 1245   HGBUR MODERATE (A) 02/11/2024 1245   BILIRUBINUR NEGATIVE 02/11/2024 1245   KETONESUR 5 (A) 02/11/2024 1245   PROTEINUR 30 (A) 02/11/2024 1245   NITRITE NEGATIVE 02/11/2024 1245   LEUKOCYTESUR NEGATIVE 02/11/2024 1245    Radiological Exams on Admission: I have personally reviewed images DG Lumbar Spine 2-3 Views Result Date:  02/11/2024 CLINICAL DATA:  fall EXAM: LUMBAR SPINE - 2-3 VIEW COMPARISON:  September 26, 2006. FINDINGS: There are five non-rib bearing lumbar-type vertebral bodies with riblets at T12. Mild dextrocurvature of the lumbar spine, possibly positional. No significant spondylolisthesis given degree of rotation. There is no evidence for acute fracture or subluxation. Mild intervertebral disc space height loss of L2-3, L3-4 and L4-5. Lower lumbar facet arthropathy. Status post cholecystectomy. Sacrum is obscured by overlapping bowel contents. Atherosclerotic calcifications. IMPRESSION: 1. No acute fracture or subluxation of the lumbar spine. If persistent clinical concern for traumatic injury, recommend dedicated cross-sectional imaging. 2. Mild degenerative changes of the lumbar spine. Electronically Signed   By: Corean Salter M.D.   On: 02/11/2024 13:50   CT Cervical Spine Wo Contrast Result Date: 02/11/2024 CLINICAL DATA:  Status post fall. EXAM: CT CERVICAL SPINE WITHOUT CONTRAST TECHNIQUE: Multidetector CT imaging of the cervical spine was performed without intravenous contrast. Multiplanar CT image reconstructions were also generated. RADIATION DOSE REDUCTION: This exam was performed according to the departmental dose-optimization program which includes automated exposure control, adjustment of the mA and/or kV according to patient size and/or use of iterative reconstruction technique. COMPARISON:  June 20, 2023 FINDINGS: Alignment: There is chronic 1 mm to 2 mm anterolisthesis of the C4 vertebral body on C5. Skull base and vertebrae: No acute fracture. No primary bone lesion or focal pathologic process. Soft tissues and spinal canal: No prevertebral fluid or swelling. No visible canal hematoma. Disc levels: Marked severity endplate sclerosis, anterior osteophyte formation and posterior bony spurring is seen at the levels of C5-C6 and C6-C7 with mild to moderate severity anterior osteophyte formation also  present at C2-C3 and C4-C5. There is marked severity narrowing of the anterior atlantoaxial articulation with marked severity intervertebral disc space narrowing at the levels of C5-C6 and C6-C7. Bilateral marked severity multilevel facet joint hypertrophy is noted. Upper chest: There is evidence of emphysematous lung disease with mild to moderate severity areas of scarring and/or atelectasis is seen within the bilateral upper lobes. Other: None. IMPRESSION: 1. No acute fracture or traumatic subluxation. 2. Marked severity multilevel degenerative changes, as described above. 3. Emphysematous lung disease with mild to moderate severity areas of scarring and/or atelectasis within the bilateral upper lobes. Electronically Signed   By: Suzen Dwane HERO.D.  On: 02/11/2024 13:49   DG Hip Unilat W or Wo Pelvis 2-3 Views Left Result Date: 02/11/2024 CLINICAL DATA:  fall EXAM: DG HIP (WITH OR WITHOUT PELVIS) 2-3V LEFT COMPARISON:  April 13, 2016 FINDINGS: No acute fracture or dislocation. Hip joint spaces are relatively preserved. Mild symmetric degenerative changes of the sacroiliac joints. Sacrum is obscured by overlapping bowel contents. No area of erosion or osseous destruction. No unexpected radiopaque foreign body. Soft tissues are unremarkable. IMPRESSION: 1. No acute fracture or dislocation. 2. If there is a persistent clinical concern for nondisplaced hip or pelvic fracture, recommend dedicated pelvic CT or MRI. Electronically Signed   By: Corean Salter M.D.   On: 02/11/2024 13:49   DG Chest 2 View Result Date: 02/11/2024 CLINICAL DATA:  fall EXAM: CHEST - 2 VIEW COMPARISON:  June 19, 2023 FINDINGS: The cardiomediastinal silhouette is unchanged in contour. No pleural effusion. No pneumothorax. No acute pleuroparenchymal abnormality. Visualized abdomen is unremarkable. Multilevel degenerative changes of the thoracic spine. IMPRESSION: No acute cardiopulmonary abnormality. Electronically Signed   By:  Corean Salter M.D.   On: 02/11/2024 13:47   CT Head Wo Contrast Result Date: 02/11/2024 CLINICAL DATA:  Status post fall. EXAM: CT HEAD WITHOUT CONTRAST TECHNIQUE: Contiguous axial images were obtained from the base of the skull through the vertex without intravenous contrast. RADIATION DOSE REDUCTION: This exam was performed according to the departmental dose-optimization program which includes automated exposure control, adjustment of the mA and/or kV according to patient size and/or use of iterative reconstruction technique. COMPARISON:  June 20, 2023 FINDINGS: Brain: There is mild cerebral atrophy with widening of the extra-axial spaces and ventricular dilatation. There are areas of decreased attenuation within the white matter tracts of the supratentorial brain, consistent with microvascular disease changes. Vascular: No hyperdense vessel or unexpected calcification. Skull: Normal. Negative for fracture or focal lesion. Sinuses/Orbits: No acute finding. Other: None. IMPRESSION: No acute intracranial abnormality. Electronically Signed   By: Suzen Dials M.D.   On: 02/11/2024 13:45    EKG: My personal interpretation of EKG shows: Sinus tachycardia without any acute ST changes  Assessment/Plan Principal Problem:   Acute encephalopathy Active Problems:   COPD (chronic obstructive pulmonary disease) (HCC)   AKI (acute kidney injury)   HTN (hypertension)   Diabetes mellitus without complication (HCC)   Tobacco use disorder   Hypokalemia   Essential tremor   Syncope: Patient with syncopal episode that appears to be cardiac mediated given patient states she was walking 1 remembers waking up on the floor.  She does not report any prodrome.  Will monitor patient on telemetry.  Will get echocardiogram.  Patient does have hypokalemia which will predispose to arrhythmia will replete potassium and magnesium .  Will consult cardiology for further recommendations as she may need long-term  monitoring. PT and OT consulted.   AKI: Likely in setting of dehydration as patient had a fall and was found down.  She is tachycardic.  Baseline normal renal function.  Will give IVF and continue to monitor renal function.  Avoid nephrotoxic agents.  Renally dose medications.  Elevated CK: Mild.  Getting IVF.  Will trend  Hypokalemia: Checking magnesium  and replating both potassium and magnesium .  GERD: Continue home PPI  Chronic hypoxic respiratory failure secondary to COPD: Not in exacerbation.  Currently on baseline 2 L nasal cannula.  Will target pulse ox 88-92 given COPD.  Continue home inhalers and use nebulizers as needed.  Hypertension: Holding home medicine given patient is normotensive.  Believe  this can be resumed tomorrow.  Type 2 diabetes: Holding home regimen.  Placing patient on SSI.  Hyperlipidemia: Holding statin in setting of elevated LFT and CK.  Essential tremor: Continue home propranolol  VTE prophylaxis:  SQ Heparin   Diet: Heart healthy Code Status:  Full Telemetry:  Admission status: Observation, Telemetry bed Patient is from: Home Anticipated d/c is to: Home Anticipated d/c is in: 1-2 days   Family Communication: Updated at bedside  Consults called: None   Severity of Illness: The appropriate patient status for this patient is OBSERVATION. Observation status is judged to be reasonable and necessary in order to provide the required intensity of service to ensure the patient's safety. The patient's presenting symptoms, physical exam findings, and initial radiographic and laboratory data in the context of their medical condition is felt to place them at decreased risk for further clinical deterioration. Furthermore, it is anticipated that the patient will be medically stable for discharge from the hospital within 2 midnights of admission.    Morene Bathe, MD Jolynn DEL. [facility]     [1]  Allergies Allergen Reactions   Sulfa Antibiotics  Nausea Only    Patient states she gets dehydrated with sulfa as well.   Sulfasalazine Nausea Only    Patient states she gets dehydrated with sulfa as well.   "

## 2024-02-11 NOTE — ED Notes (Signed)
Pt refuses in and out cath  

## 2024-02-11 NOTE — ED Provider Notes (Signed)
.----------------------------------------- °  5:48 PM on 02/11/2024 -----------------------------------------  Blood pressure 126/68, pulse (!) 110, temperature 97.6 F (36.4 C), resp. rate 18, SpO2 93%.  in short, Sheri Barron is a 69 y.o. female with a chief complaint of altered mental status and fall.  Refer to the original H&P for additional details.  The current plan of care is to follow-up labs, UA, likely needs admission for altered mental status.  Labs notable for hypokalemia, UA is not consistent with UTI.  She received some potassium earlier.  Given the fall, mental status change, and that she lives alone, she will need to be admitted for further management.  Consulted hospitalist, she is admitted.    Clinical Course as of 02/11/24 1750  Sat Feb 11, 2024  1526 DG Lumbar Spine 2-3 Views IMPRESSION: 1. No acute fracture or subluxation of the lumbar spine. If persistent clinical concern for traumatic injury, recommend dedicated cross-sectional imaging. 2. Mild degenerative changes of the lumbar spine.   [TT]  1526 CT Cervical Spine Wo Contrast IMPRESSION: 1. No acute fracture or traumatic subluxation. 2. Marked severity multilevel degenerative changes, as described above. 3. Emphysematous lung disease with mild to moderate severity areas of scarring and/or atelectasis within the bilateral upper lobes.   [TT]  1527 DG Hip Unilat W or Wo Pelvis 2-3 Views Left IMPRESSION: 1. No acute fracture or dislocation. 2. If there is a persistent clinical concern for nondisplaced hip or pelvic fracture, recommend dedicated pelvic CT or MRI.   [TT]  1527 DG Chest 2 View No acute cardiopulmonary abnormality.  [TT]  1527 CT Head Wo Contrast No acute intracranial abnormality.  [TT]  1729 Urinalysis, Routine w reflex microscopic -Urine, Clean Catch(!) Not consistent with UTI [TT]    Clinical Course User Index [TT] Waymond Lorelle Cummins, MD     Medications  ipratropium-albuterol   (DUONEB) 0.5-2.5 (3) MG/3ML nebulizer solution 3 mL (has no administration in time range)  potassium chloride  SA (KLOR-CON  M) CR tablet 40 mEq (40 mEq Oral Given 02/11/24 1455)  sodium chloride  0.9 % bolus 1,000 mL (1,000 mLs Intravenous New Bag/Given 02/11/24 1457)     ED Discharge Orders     None      Final diagnoses:  AKI (acute kidney injury)  Altered mental status, unspecified altered mental status type  Hypokalemia      Waymond Lorelle Cummins, MD 02/11/24 1750

## 2024-02-11 NOTE — ED Notes (Signed)
 Pt states she will try to provide a UA soon. Refuses in and out cath

## 2024-02-11 NOTE — ED Triage Notes (Signed)
 Pt arrives via [facility] from home. PT reports she somehow fell yesterday at home. Pt had called her granddaughter this morning and told her that she had fell. PT told family that her husband (who has passed away years ago) stayed with her throughout the night. Pt arrives to the ED AxOx4. She doesn't recall how she fell. She reports pain to left side of her head, bilateral shoulders and her back. Pt denies blood thinners.

## 2024-02-12 ENCOUNTER — Observation Stay: Admit: 2024-02-12 | Discharge: 2024-02-12 | Disposition: A | Attending: Internal Medicine

## 2024-02-12 DIAGNOSIS — E785 Hyperlipidemia, unspecified: Secondary | ICD-10-CM | POA: Diagnosis present

## 2024-02-12 DIAGNOSIS — R296 Repeated falls: Secondary | ICD-10-CM | POA: Diagnosis present

## 2024-02-12 DIAGNOSIS — J449 Chronic obstructive pulmonary disease, unspecified: Secondary | ICD-10-CM

## 2024-02-12 DIAGNOSIS — Z9981 Dependence on supplemental oxygen: Secondary | ICD-10-CM | POA: Diagnosis not present

## 2024-02-12 DIAGNOSIS — R55 Syncope and collapse: Secondary | ICD-10-CM

## 2024-02-12 DIAGNOSIS — R4182 Altered mental status, unspecified: Secondary | ICD-10-CM

## 2024-02-12 DIAGNOSIS — E861 Hypovolemia: Secondary | ICD-10-CM | POA: Diagnosis present

## 2024-02-12 DIAGNOSIS — R748 Abnormal levels of other serum enzymes: Secondary | ICD-10-CM | POA: Diagnosis present

## 2024-02-12 DIAGNOSIS — Z79899 Other long term (current) drug therapy: Secondary | ICD-10-CM | POA: Diagnosis not present

## 2024-02-12 DIAGNOSIS — Z8673 Personal history of transient ischemic attack (TIA), and cerebral infarction without residual deficits: Secondary | ICD-10-CM | POA: Diagnosis not present

## 2024-02-12 DIAGNOSIS — E119 Type 2 diabetes mellitus without complications: Secondary | ICD-10-CM | POA: Diagnosis present

## 2024-02-12 DIAGNOSIS — Z7984 Long term (current) use of oral hypoglycemic drugs: Secondary | ICD-10-CM | POA: Diagnosis not present

## 2024-02-12 DIAGNOSIS — N179 Acute kidney failure, unspecified: Secondary | ICD-10-CM | POA: Diagnosis present

## 2024-02-12 DIAGNOSIS — G250 Essential tremor: Secondary | ICD-10-CM | POA: Diagnosis present

## 2024-02-12 DIAGNOSIS — J4489 Other specified chronic obstructive pulmonary disease: Secondary | ICD-10-CM | POA: Diagnosis present

## 2024-02-12 DIAGNOSIS — E876 Hypokalemia: Secondary | ICD-10-CM

## 2024-02-12 DIAGNOSIS — W19XXXA Unspecified fall, initial encounter: Secondary | ICD-10-CM | POA: Diagnosis present

## 2024-02-12 DIAGNOSIS — Y92009 Unspecified place in unspecified non-institutional (private) residence as the place of occurrence of the external cause: Secondary | ICD-10-CM

## 2024-02-12 DIAGNOSIS — E860 Dehydration: Secondary | ICD-10-CM | POA: Diagnosis present

## 2024-02-12 DIAGNOSIS — Z7951 Long term (current) use of inhaled steroids: Secondary | ICD-10-CM | POA: Diagnosis not present

## 2024-02-12 DIAGNOSIS — J9611 Chronic respiratory failure with hypoxia: Secondary | ICD-10-CM | POA: Diagnosis present

## 2024-02-12 DIAGNOSIS — K219 Gastro-esophageal reflux disease without esophagitis: Secondary | ICD-10-CM | POA: Diagnosis present

## 2024-02-12 DIAGNOSIS — F17210 Nicotine dependence, cigarettes, uncomplicated: Secondary | ICD-10-CM | POA: Diagnosis present

## 2024-02-12 DIAGNOSIS — F419 Anxiety disorder, unspecified: Secondary | ICD-10-CM | POA: Diagnosis present

## 2024-02-12 DIAGNOSIS — G928 Other toxic encephalopathy: Secondary | ICD-10-CM | POA: Diagnosis present

## 2024-02-12 DIAGNOSIS — I10 Essential (primary) hypertension: Secondary | ICD-10-CM | POA: Diagnosis present

## 2024-02-12 DIAGNOSIS — F32A Depression, unspecified: Secondary | ICD-10-CM | POA: Diagnosis present

## 2024-02-12 DIAGNOSIS — G9340 Encephalopathy, unspecified: Secondary | ICD-10-CM | POA: Diagnosis present

## 2024-02-12 LAB — BASIC METABOLIC PANEL WITH GFR
Anion gap: 6 (ref 5–15)
BUN: 29 mg/dL — ABNORMAL HIGH (ref 8–23)
CO2: 33 mmol/L — ABNORMAL HIGH (ref 22–32)
Calcium: 9 mg/dL (ref 8.9–10.3)
Chloride: 105 mmol/L (ref 98–111)
Creatinine, Ser: 0.97 mg/dL (ref 0.44–1.00)
GFR, Estimated: >60 mL/min
Glucose, Bld: 111 mg/dL — ABNORMAL HIGH (ref 70–99)
Potassium: 4.5 mmol/L (ref 3.5–5.1)
Sodium: 143 mmol/L (ref 135–145)

## 2024-02-12 LAB — CBC
HCT: 27.6 % — ABNORMAL LOW (ref 36.0–46.0)
Hemoglobin: 8.4 g/dL — ABNORMAL LOW (ref 12.0–15.0)
MCH: 26.8 pg (ref 26.0–34.0)
MCHC: 30.4 g/dL (ref 30.0–36.0)
MCV: 87.9 fL (ref 80.0–100.0)
Platelets: 305 10*3/uL (ref 150–400)
RBC: 3.14 MIL/uL — ABNORMAL LOW (ref 3.87–5.11)
RDW: 14.5 % (ref 11.5–15.5)
WBC: 10.5 10*3/uL (ref 4.0–10.5)
nRBC: 0 % (ref 0.0–0.2)

## 2024-02-12 LAB — ECHOCARDIOGRAM COMPLETE
AR max vel: 2.36 cm2
AV Peak grad: 9.9 mmHg
Ao pk vel: 1.57 m/s
Area-P 1/2: 3.66 cm2
S' Lateral: 3 cm

## 2024-02-12 LAB — LIPID PANEL
Cholesterol: 84 mg/dL (ref 0–200)
HDL: 19 mg/dL — ABNORMAL LOW
LDL Cholesterol: 33 mg/dL (ref 0–99)
Total CHOL/HDL Ratio: 4.4 ratio
Triglycerides: 156 mg/dL — ABNORMAL HIGH
VLDL: 31 mg/dL (ref 0–40)

## 2024-02-12 LAB — HIV ANTIBODY (ROUTINE TESTING W REFLEX): HIV Screen 4th Generation wRfx: NONREACTIVE

## 2024-02-12 LAB — GLUCOSE, CAPILLARY: Glucose-Capillary: 97 mg/dL (ref 70–99)

## 2024-02-12 LAB — CBG MONITORING, ED
Glucose-Capillary: 92 mg/dL (ref 70–99)
Glucose-Capillary: 87 mg/dL (ref 70–99)
Glucose-Capillary: 129 mg/dL — ABNORMAL HIGH (ref 70–99)

## 2024-02-12 LAB — HEMOGLOBIN A1C
Hgb A1c MFr Bld: 5.7 % — ABNORMAL HIGH (ref 4.8–5.6)
Mean Plasma Glucose: 116.89 mg/dL

## 2024-02-12 LAB — CK: Total CK: 264 U/L — ABNORMAL HIGH (ref 38–234)

## 2024-02-12 LAB — MAGNESIUM: Magnesium: 1.7 mg/dL (ref 1.7–2.4)

## 2024-02-12 MED ORDER — MAGNESIUM SULFATE 2 GM/50ML IV SOLN
2.0000 g | Freq: Once | INTRAVENOUS | Status: AC
  Administered 2024-02-12: 2 g via INTRAVENOUS
  Filled 2024-02-12: qty 50

## 2024-02-12 NOTE — Progress Notes (Signed)
" °  Echocardiogram 2D Echocardiogram has been performed.  Sheri Barron 02/12/2024, 8:00 AM "

## 2024-02-12 NOTE — Consult Note (Addendum)
 " Cardiology Consultation:  Patient ID: Sheri Barron MRN: [id number]; DOB: 1955-06-27  Admit date: 02/11/2024 Date of Consult: 02/12/2024  Primary Care Provider: Adina Buel HERO, MD Primary Cardiologist: None  Primary Electrophysiologist:  None   Patient Profile:  Sheri Barron is a 69 y.o. female with a hx of HTN, DM 2, HLD, COPD on home O2 who is being seen today for the evaluation of syncope.  History of Present Illness:  [name] presents with a syncopal episode.  Was found down.  No recollection of the event.  No prodrome reported.  She is a poor historian.  CBC with leukocytosis, anemia to 9.9 which is stable.  Potassium of 3, bicarb 33, BUN 47, creatinine 1.37 with mildly elevated LFTs.  Baseline creatinine is normal.  CK 650.  Magnesium  1.0.  Past Medical History: Past Medical History:  Diagnosis Date   Anxiety    Asthma    Chronic constipation    COPD (chronic obstructive pulmonary disease) (HCC)    Depression    Diabetes mellitus without complication (HCC)    GERD (gastroesophageal reflux disease)    Hypertension    Stroke [facility])     Past Surgical History: Past Surgical History:  Procedure Laterality Date   ABDOMINAL HYSTERECTOMY     APPENDECTOMY     CHOLECYSTECTOMY     COLONOSCOPY WITH PROPOFOL  N/A 09/04/2014   Procedure: COLONOSCOPY WITH PROPOFOL ;  Surgeon: Sheri ONEIDA Holmes, MD;  Location: [facility] ENDOSCOPY;  Service: Endoscopy;  Laterality: N/A;   COLONOSCOPY WITH PROPOFOL  N/A 10/21/2017   Procedure: COLONOSCOPY WITH PROPOFOL ;  Surgeon: Barron Sheri ONEIDA, MD;  Location: [facility] ENDOSCOPY;  Service: Endoscopy;  Laterality: N/A;   EGD-colonsocopy     ESOPHAGOGASTRODUODENOSCOPY (EGD) WITH PROPOFOL  N/A 09/04/2014   Procedure: ESOPHAGOGASTRODUODENOSCOPY (EGD) WITH PROPOFOL ;  Surgeon: Sheri ONEIDA Holmes, MD;  Location: [facility] ENDOSCOPY;  Service: Endoscopy;  Laterality: N/A;   SAVORY DILATION N/A 09/04/2014   Procedure: SAVORY DILATION;  Surgeon: Sheri ONEIDA Holmes, MD;   Location: [facility] ENDOSCOPY;  Service: Endoscopy;  Laterality: N/A;     Allergies:    Allergies[1]  Social History:   Social History   Socioeconomic History   Marital status: Widowed    Spouse name: Not on file   Number of children: Not on file   Years of education: Not on file   Highest education level: Not on file  Occupational History   Not on file  Tobacco Use   Smoking status: Every Day    Current packs/day: 0.50    Types: Cigarettes   Smokeless tobacco: Never  Substance and Sexual Activity   Alcohol  use: No    Alcohol /week: 0.0 standard drinks of alcohol    Drug use: Yes    Types: Marijuana   Sexual activity: Not on file  Other Topics Concern   Not on file  Social History Narrative   Not on file   Social Drivers of Health   Tobacco Use: High Risk (02/11/2024)   Patient History    Smoking Tobacco Use: Every Day    Smokeless Tobacco Use: Never    Passive Exposure: Not on file  Financial Resource Strain: Not on file  Food Insecurity: Food Insecurity Present (06/20/2023)   Hunger Vital Sign    Worried About Running Out of Food in the Last Year: Often true    Ran Out of Food in the Last Year: Sometimes true  Transportation Needs: Unmet Transportation Needs (06/20/2023)   PRAPARE - Administrator, Civil Service (  Medical): Yes    Lack of Transportation (Non-Medical): No  Physical Activity: Not on file  Stress: Not on file  Social Connections: Patient Declined (06/20/2023)   Social Connection and Isolation Panel    Frequency of Communication with Friends and Family: Patient declined    Frequency of Social Gatherings with Friends and Family: Patient declined    Attends Religious Services: Patient declined    Database Administrator or Organizations: Patient declined    Attends Banker Meetings: Patient declined    Marital Status: Patient declined  Intimate Partner Violence: Not At Risk (06/20/2023)   Humiliation, Afraid, Rape, and Kick questionnaire     Fear of Current or Ex-Partner: No    Emotionally Abused: No    Physically Abused: No    Sexually Abused: No  Depression (PHQ2-9): Not on file  Alcohol  Screen: Not on file  Housing: Low Risk (06/20/2023)   Housing Stability Vital Sign    Unable to Pay for Housing in the Last Year: No    Number of Times Moved in the Last Year: 0    Homeless in the Last Year: No  Utilities: Not At Risk (06/20/2023)   AHC Utilities    Threatened with loss of utilities: No  Health Literacy: Not on file     Family History:    Family History  Problem Relation Age of Onset   Breast cancer Neg Hx      ROS:  All other ROS reviewed and negative. Pertinent positives noted in the HPI.     Physical Exam/Data:   Vitals:   02/12/24 0527 02/12/24 0527 02/12/24 0830 02/12/24 0851  BP:  (!) 98/53 (!) 100/52   Pulse:  66 83   Resp:  20 (!) 23   Temp: 98.7 F (37.1 C)   98.2 F (36.8 C)  TempSrc: Oral   Oral  SpO2:  100% 97%     Intake/Output Summary (Last 24 hours) at 02/12/2024 0932 Last data filed at 02/11/2024 2255 Gross per 24 hour  Intake 50 ml  Output 1120 ml  Net -1070 ml       06/20/2023   12:23 AM 11/29/2022    7:00 PM 11/28/2022    4:36 PM  Last 3 Weights  Weight (lbs) 124 lb 12.5 oz 140 lb 6.9 oz 135 lb  Weight (kg) 56.6 kg 63.7 kg 61.236 kg    There is no height or weight on file to calculate BMI.  General: no acute distress  Neck: no JVD Cardiac: Normal S1, S2; RRR; no murmurs, rubs, or gallops Lungs: Clear to auscultation bilaterally, no wheezing, rhonchi or rales  Ext: No edema, pulses 2+ Skin: Warm and dry Neuro: Alert and oriented to person, place, time, and situation Psych: Normal mood and affect   EKG:  The EKG was personally reviewed and demonstrates: Sinus tachycardia, borderline prolonged QT Telemetry:  Telemetry was personally reviewed and demonstrates: Sinus tachycardia, sinus rhythm  Relevant CV Studies: -TTE 02/12/2024 normal biventricular function, no significant  valvular disease.  Small collapsible IVC. - CT chest 06/2023 mild calcifications of ostial RCA.  Descending aortic atherosclerosis.  Assessment and Plan:  Syncope and collapse Hypovolemia Azotemia Hypomagnesemia Hypokalemia Patient presents with undifferentiated syncope.  She is a poor historian and cannot provide much collateral information.  She says she does not recall anything surrounding the event.  She denies any prodromal symptoms.  Her echocardiogram is normal, no structural substrate for syncope.  She does have profound electrolyte derangements with  a potassium of 3.0 and a magnesium  of 1.0, so acquired arrhythmia is possible.  Also has AKI with azotemia, so hypovolemic syncope may be the etiology.  IVC is small and collapsible on echocardiogram.  Plan: - Agree with fluid resuscitation - Replete for K greater than 4 and mag greater than 2 - Maintain telemetry while hospitalized - Outpatient monitor when she is nearing discharge  Coronary artery calcifications Seen on prior CT imaging.  No angina.  LDL goal less than 70.  Plan: - Check lipids - Start ASA 81 mg daily if no contraindication - Start Crestor 5 mg daily once LFTs resolve   Cardiology will sign off now; as above, we will arrange for an outpatient monitor.  Please feel free to reengage as needed.  For questions or updates, please contact [facility] Please consult [email or web address] for contact info under    Signed, Caron Poser, MD  [place]  [facility]  02/12/2024 9:32 AM     [1]  Allergies Allergen Reactions   Sulfa Antibiotics Nausea Only    Patient states she gets dehydrated with sulfa as well.   Sulfasalazine Nausea Only    Patient states she gets dehydrated with sulfa as well.   "

## 2024-02-12 NOTE — Progress Notes (Signed)
 [facility]  - [place] at [facility]   PATIENT NAME: Sheri Barron    MR#:  [id number]  DATE OF BIRTH:  [birth year]  SUBJECTIVE:   No family at bedside. Patient appears to be a poor historian. Complains of recurring falls which she claims as passing out. Cannot remember any events in detail. Similar presentation in June of last year. Lives at home alone. Uses chronic oxygen   VITALS:  Blood pressure (!) 100/48, pulse 80, temperature 98.1 F (36.7 C), temperature source Oral, resp. rate (!) 22, SpO2 100%.  PHYSICAL EXAMINATION:   GENERAL:  69 y.o.-year-old patient with no acute distress. Chronically ill LUNGS: decreased breath sounds bilaterally, no wheezing CARDIOVASCULAR: S1, S2 normal. No murmur   ABDOMEN: Soft, nontender, nondistended. EXTREMITIES: No  edema b/l.    NEUROLOGIC: nonfocal  patient is alert and awake, weak SKIN: per RN  LABORATORY PANEL:  CBC Recent Labs  Lab 02/12/24 0413  WBC 10.5  HGB 8.4*  HCT 27.6*  PLT 305    Chemistries  Recent Labs  Lab 02/11/24 1245 02/12/24 0413  NA 142 143  K 3.0* 4.5  CL 97* 105  CO2 33* 33*  GLUCOSE 167* 111*  BUN 47* 29*  CREATININE 1.37* 0.97  CALCIUM  9.2 9.0  MG 1.0* 1.7  AST 60*  --   ALT 47*  --   ALKPHOS 119  --   BILITOT <0.2  --    Cardiac Enzymes No results for input(s): TROPONINI in the last 168 hours. RADIOLOGY:  ECHOCARDIOGRAM COMPLETE Result Date: 02/12/2024    ECHOCARDIOGRAM REPORT   Patient Name:   Sheri Barron Date of Exam: 02/12/2024 Medical Rec #:  [id number]        Height:       61.0 in Accession #:    [id number]       Weight:       124.8 lb Date of Birth:  Dec 10, 1955         BSA:          1.546 m Patient Age:    68 years         BP:           98/53 mmHg Patient Gender: F                HR:           86 bpm. Exam Location:  [facility] Procedure: 2D Echo, Cardiac Doppler, Color Doppler and Strain Analysis (Both            Spectral and Color Flow Doppler were utilized during  procedure). Indications:     Syncope R55  History:         Patient has no prior history of Echocardiogram examinations.  Sonographer:     Thedora Louder RDCS, FASE Referring Phys:  [id number] [facility] Diagnosing Phys: Caron Poser  Sonographer Comments: Global longitudinal strain was attempted. IMPRESSIONS  1. Left ventricular ejection fraction, by estimation, is 55 to 60%. The left ventricle has normal function. Left ventricular endocardial border not optimally defined to evaluate regional wall motion. Left ventricular diastolic parameters were normal. The average left ventricular global longitudinal strain is -19.4 %. The global longitudinal strain is normal.  2. Right ventricular systolic function is normal. The right ventricular size is normal.  3. The mitral valve is degenerative. Trivial mitral valve regurgitation. No evidence of mitral stenosis.  4. The aortic valve has an indeterminant number of cusps. Aortic valve regurgitation is trivial.  No aortic stenosis is present.  5. The inferior vena cava is normal in size with greater than 50% respiratory variability, suggesting right atrial pressure of 3 mmHg. Comparison(s): No prior Echocardiogram. Conclusion(s)/Recommendation(s): Normal biventricular function without evidence of hemodynamically significant valvular heart disease. FINDINGS  Left Ventricle: Left ventricular ejection fraction, by estimation, is 55 to 60%. The left ventricle has normal function. Left ventricular endocardial border not optimally defined to evaluate regional wall motion. The average left ventricular global longitudinal strain is -19.4 %. Strain was performed and the global longitudinal strain is normal. The left ventricular internal cavity size was normal in size. There is no left ventricular hypertrophy. Left ventricular diastolic parameters were normal. Right Ventricle: The right ventricular size is normal. Right vetricular wall thickness was not well visualized. Right  ventricular systolic function is normal. Left Atrium: Left atrial size was not well visualized. Right Atrium: Right atrial size was not well visualized. Pericardium: There is no evidence of pericardial effusion. Presence of epicardial fat layer. Mitral Valve: The mitral valve is degenerative in appearance. Mild to moderate mitral annular calcification. Trivial mitral valve regurgitation. No evidence of mitral valve stenosis. Tricuspid Valve: The tricuspid valve is not well visualized. Tricuspid valve regurgitation is trivial. No evidence of tricuspid stenosis. Aortic Valve: The aortic valve has an indeterminant number of cusps. Aortic valve regurgitation is trivial. No aortic stenosis is present. Aortic valve peak gradient measures 9.9 mmHg. Pulmonic Valve: The pulmonic valve was not well visualized. Pulmonic valve regurgitation is not visualized. No evidence of pulmonic stenosis. Aorta: The aortic root and ascending aorta are structurally normal, with no evidence of dilitation. Venous: The inferior vena cava is normal in size with greater than 50% respiratory variability, suggesting right atrial pressure of 3 mmHg. IAS/Shunts: The interatrial septum was not well visualized.  LEFT VENTRICLE PLAX 2D LVIDd:         4.30 cm   Diastology LVIDs:         3.00 cm   LV e' medial:    13.40 cm/s LV PW:         0.80 cm   LV E/e' medial:  7.1 LV IVS:        0.70 cm   LV e' lateral:   8.70 cm/s LVOT diam:     2.00 cm   LV E/e' lateral: 11.0 LV SV:         62 LV SV Index:   40        2D Longitudinal Strain LVOT Area:     3.14 cm  2D Strain GLS Avg:     -19.4 % LV IVRT:       77 msec  RIGHT VENTRICLE RV Basal diam:  2.20 cm RV S prime:     13.40 cm/s TAPSE (M-mode): 2.2 cm LEFT ATRIUM             Index        RIGHT ATRIUM           Index LA diam:        3.40 cm 2.20 cm/m   RA Area:     12.90 cm LA Vol (A2C):   34.4 ml 22.26 ml/m  RA Volume:   30.20 ml  19.54 ml/m LA Vol (A4C):   31.9 ml 20.64 ml/m LA Biplane Vol: 34.3 ml  22.19 ml/m  AORTIC VALVE AV Area (Vmax): 2.36 cm AV Vmax:        157.00 cm/s AV Peak Grad:   9.9 mmHg  LVOT Vmax:      118.00 cm/s LVOT Vmean:     69.200 cm/s LVOT VTI:       0.198 m  AORTA Ao Root diam: 3.30 cm Ao Asc diam:  2.80 cm MITRAL VALVE               TRICUSPID VALVE MV Area (PHT): 3.66 cm    TR Peak grad:   20.1 mmHg MV Decel Time: 207 msec    TR Vmax:        224.00 cm/s MV E velocity: 95.50 cm/s MV A velocity: 89.10 cm/s  SHUNTS MV E/A ratio:  1.07        Systemic VTI:  0.20 m                            Systemic Diam: 2.00 cm Caron Poser Electronically signed by Caron Poser Signature Date/Time: 02/12/2024/9:24:30 AM    Final    DG Lumbar Spine 2-3 Views Result Date: 02/11/2024 CLINICAL DATA:  fall EXAM: LUMBAR SPINE - 2-3 VIEW COMPARISON:  September 26, 2006. FINDINGS: There are five non-rib bearing lumbar-type vertebral bodies with riblets at T12. Mild dextrocurvature of the lumbar spine, possibly positional. No significant spondylolisthesis given degree of rotation. There is no evidence for acute fracture or subluxation. Mild intervertebral disc space height loss of L2-3, L3-4 and L4-5. Lower lumbar facet arthropathy. Status post cholecystectomy. Sacrum is obscured by overlapping bowel contents. Atherosclerotic calcifications. IMPRESSION: 1. No acute fracture or subluxation of the lumbar spine. If persistent clinical concern for traumatic injury, recommend dedicated cross-sectional imaging. 2. Mild degenerative changes of the lumbar spine. Electronically Signed   By: Corean Salter M.D.   On: 02/11/2024 13:50   CT Cervical Spine Wo Contrast Result Date: 02/11/2024 CLINICAL DATA:  Status post fall. EXAM: CT CERVICAL SPINE WITHOUT CONTRAST TECHNIQUE: Multidetector CT imaging of the cervical spine was performed without intravenous contrast. Multiplanar CT image reconstructions were also generated. RADIATION DOSE REDUCTION: This exam was performed according to the departmental  dose-optimization program which includes automated exposure control, adjustment of the mA and/or kV according to patient size and/or use of iterative reconstruction technique. COMPARISON:  June 20, 2023 FINDINGS: Alignment: There is chronic 1 mm to 2 mm anterolisthesis of the C4 vertebral body on C5. Skull base and vertebrae: No acute fracture. No primary bone lesion or focal pathologic process. Soft tissues and spinal canal: No prevertebral fluid or swelling. No visible canal hematoma. Disc levels: Marked severity endplate sclerosis, anterior osteophyte formation and posterior bony spurring is seen at the levels of C5-C6 and C6-C7 with mild to moderate severity anterior osteophyte formation also present at C2-C3 and C4-C5. There is marked severity narrowing of the anterior atlantoaxial articulation with marked severity intervertebral disc space narrowing at the levels of C5-C6 and C6-C7. Bilateral marked severity multilevel facet joint hypertrophy is noted. Upper chest: There is evidence of emphysematous lung disease with mild to moderate severity areas of scarring and/or atelectasis is seen within the bilateral upper lobes. Other: None. IMPRESSION: 1. No acute fracture or traumatic subluxation. 2. Marked severity multilevel degenerative changes, as described above. 3. Emphysematous lung disease with mild to moderate severity areas of scarring and/or atelectasis within the bilateral upper lobes. Electronically Signed   By: Suzen Dials M.D.   On: 02/11/2024 13:49   DG Hip Unilat W or Wo Pelvis 2-3 Views Left Result Date: 02/11/2024 CLINICAL DATA:  fall EXAM: DG HIP (WITH  OR WITHOUT PELVIS) 2-3V LEFT COMPARISON:  April 13, 2016 FINDINGS: No acute fracture or dislocation. Hip joint spaces are relatively preserved. Mild symmetric degenerative changes of the sacroiliac joints. Sacrum is obscured by overlapping bowel contents. No area of erosion or osseous destruction. No unexpected radiopaque foreign body. Soft  tissues are unremarkable. IMPRESSION: 1. No acute fracture or dislocation. 2. If there is a persistent clinical concern for nondisplaced hip or pelvic fracture, recommend dedicated pelvic CT or MRI. Electronically Signed   By: Corean Salter M.D.   On: 02/11/2024 13:49   DG Chest 2 View Result Date: 02/11/2024 CLINICAL DATA:  fall EXAM: CHEST - 2 VIEW COMPARISON:  June 19, 2023 FINDINGS: The cardiomediastinal silhouette is unchanged in contour. No pleural effusion. No pneumothorax. No acute pleuroparenchymal abnormality. Visualized abdomen is unremarkable. Multilevel degenerative changes of the thoracic spine. IMPRESSION: No acute cardiopulmonary abnormality. Electronically Signed   By: Corean Salter M.D.   On: 02/11/2024 13:47   CT Head Wo Contrast Result Date: 02/11/2024 CLINICAL DATA:  Status post fall. EXAM: CT HEAD WITHOUT CONTRAST TECHNIQUE: Contiguous axial images were obtained from the base of the skull through the vertex without intravenous contrast. RADIATION DOSE REDUCTION: This exam was performed according to the departmental dose-optimization program which includes automated exposure control, adjustment of the mA and/or kV according to patient size and/or use of iterative reconstruction technique. COMPARISON:  June 20, 2023 FINDINGS: Brain: There is mild cerebral atrophy with widening of the extra-axial spaces and ventricular dilatation. There are areas of decreased attenuation within the white matter tracts of the supratentorial brain, consistent with microvascular disease changes. Vascular: No hyperdense vessel or unexpected calcification. Skull: Normal. Negative for fracture or focal lesion. Sinuses/Orbits: No acute finding. Other: None. IMPRESSION: No acute intracranial abnormality. Electronically Signed   By: Suzen Dials M.D.   On: 02/11/2024 13:45    Assessment and Plan Sheri Barron is a 69 y.o. year old female with medical history of hypertension, hyperlipidemia, type 2  diabetes, chronic hypoxic respiratory failure on 2 L nasal cannula secondary to COPD, tobacco use disorder, history of stroke presented to the ED after being found down.  Patient had a fall and she is unable to recall exact details of the fall.  There is appears to be a recurrent issue for the patient as she had an admission for a fall in 06/2023 where she was unable to recall details of the form.  No loss of consciousness reported.    CT head, cervical spine, plain radiography of the lumbar chest and hips without any acute findings.   Syncope vs recurrent falls (pt poor historian) - Patient does have hypokalemia which will predispose to arrhythmia will replete potassium and magnesium .  --- consult cardiology--noted--no further w/u other than replete electrolytes and Monitor will be placed before discharge --PT and OT consulted.    AKI: Likely in setting of dehydration as patient had a fall and was found down.  She is tachycardic.  Baseline normal renal function.  Will give IVF and continue to monitor renal function.  Avoid nephrotoxic agents.  Renally dose medications.   Elevated CK: Mild.  Getting IVF.  Will trend   Hypokalemia: Checking magnesium  and replating both potassium and magnesium --RPH to rpelace   GERD: Continue home PPI   Chronic hypoxic respiratory failure secondary to COPD: Not in exacerbation.  Currently on baseline 2 L nasal cannula.  Will target pulse ox 88-92 given COPD.  Continue home inhalers and use nebulizers as needed.  Hypertension: Holding home medicine given patient is normotensive.  Believe this can be resumed tomorrow.   Type 2 diabetes: Holding home regimen.  Placing patient on SSI.   Hyperlipidemia: Holding statin in setting of elevated LFT and CK.   Essential tremor: Continue home propranolol   Procedures: Family communication :none Consults :cardiology CODE STATUS: full DVT Prophylaxis :heparin  Level of care: Telemetry Status is: Inpatient Remains  inpatient appropriate because: replacing mag and k. PT/OT to see pt    TOTAL TIME TAKING CARE OF THIS PATIENT: 35 minutes.  >50% time spent on counselling and coordination of care  Note: This dictation was prepared with Dragon dictation along with smaller phrase technology. Any transcriptional errors that result from this process are unintentional.  Leita Blanch M.D    [facility]   CC: Primary care physician; Adina Buel HERO, MD

## 2024-02-12 NOTE — Progress Notes (Signed)
 PHARMACY CONSULT NOTE - FOLLOW UP  Pharmacy Consult for Electrolyte Monitoring and Replacement   Recent Labs: Potassium (mmol/L)  Date Value  02/12/2024 4.5  10/18/2013 3.6   Magnesium  (mg/dL)  Date Value  97/98/7973 1.7  09/05/2013 1.9   Calcium  (mg/dL)  Date Value  97/98/7973 9.0   Calcium , Total (mg/dL)  Date Value  89/91/7984 8.3 (L)   Albumin (g/dL)  Date Value  98/68/7973 3.5  10/18/2013 3.4   Phosphorus (mg/dL)  Date Value  93/86/7974 1.5 (L)   Sodium (mmol/L)  Date Value  02/12/2024 143  10/18/2013 141     Assessment:  69 y.o. year old female with medical history of hypertension, hyperlipidemia, type 2 diabetes, chronic hypoxic respiratory failure on 2 L nasal cannula secondary to COPD, tobacco use disorder, history of stroke presented to the ED after being found down. Pharmacy is asked to follow and replace electrolytes   Goal of Therapy:  Potassium 4.0 - 5.1 mmol/L Magnesium  2.0 - 2.4 mg/dL All Other Electrolytes WNL  Plan:  --2 grams IV magnesium  sulfate x 1 --recheck electrolytes in am  Adriana JONETTA Bolster ,PharmD Clinical Pharmacist 02/12/2024 2:01 PM

## 2024-02-13 ENCOUNTER — Telehealth: Payer: Self-pay | Admitting: Emergency Medicine

## 2024-02-13 DIAGNOSIS — J449 Chronic obstructive pulmonary disease, unspecified: Secondary | ICD-10-CM | POA: Diagnosis not present

## 2024-02-13 DIAGNOSIS — W19XXXA Unspecified fall, initial encounter: Secondary | ICD-10-CM | POA: Diagnosis not present

## 2024-02-13 DIAGNOSIS — R4182 Altered mental status, unspecified: Secondary | ICD-10-CM | POA: Diagnosis not present

## 2024-02-13 LAB — BASIC METABOLIC PANEL WITH GFR
Anion gap: 5 (ref 5–15)
BUN: 15 mg/dL (ref 8–23)
CO2: 34 mmol/L — ABNORMAL HIGH (ref 22–32)
Calcium: 9.1 mg/dL (ref 8.9–10.3)
Chloride: 100 mmol/L (ref 98–111)
Creatinine, Ser: 0.77 mg/dL (ref 0.44–1.00)
GFR, Estimated: >60 mL/min
Glucose, Bld: 153 mg/dL — ABNORMAL HIGH (ref 70–99)
Potassium: 3.9 mmol/L (ref 3.5–5.1)
Sodium: 140 mmol/L (ref 135–145)

## 2024-02-13 LAB — MAGNESIUM: Magnesium: 1.7 mg/dL (ref 1.7–2.4)

## 2024-02-13 LAB — GLUCOSE, CAPILLARY
Glucose-Capillary: 106 mg/dL — ABNORMAL HIGH (ref 70–99)
Glucose-Capillary: 164 mg/dL — ABNORMAL HIGH (ref 70–99)
Glucose-Capillary: 139 mg/dL — ABNORMAL HIGH (ref 70–99)
Glucose-Capillary: 134 mg/dL — ABNORMAL HIGH (ref 70–99)

## 2024-02-13 MED ORDER — ENSURE PLUS HIGH PROTEIN PO LIQD
237.0000 mL | Freq: Two times a day (BID) | ORAL | Status: DC
  Administered 2024-02-13 – 2024-02-14 (×3): 237 mL via ORAL

## 2024-02-13 MED ORDER — NICOTINE POLACRILEX 2 MG MT GUM: 2.0000 mg | CHEWING_GUM | OROMUCOSAL | Status: DC | PRN

## 2024-02-13 MED ORDER — ATORVASTATIN CALCIUM 20 MG PO TABS
40.0000 mg | ORAL_TABLET | Freq: Every day | ORAL | Status: DC
  Administered 2024-02-13 – 2024-02-14 (×2): 40 mg via ORAL
  Filled 2024-02-13 (×2): qty 2

## 2024-02-13 MED ORDER — MAGNESIUM SULFATE 2 GM/50ML IV SOLN
2.0000 g | Freq: Once | INTRAVENOUS | Status: AC
  Administered 2024-02-13: 2 g via INTRAVENOUS
  Filled 2024-02-13: qty 50

## 2024-02-13 MED ORDER — NICOTINE 7 MG/24HR TD PT24
7.0000 mg | MEDICATED_PATCH | Freq: Every day | TRANSDERMAL | Status: DC
  Administered 2024-02-13 – 2024-02-14 (×2): 7 mg via TRANSDERMAL
  Filled 2024-02-13 (×2): qty 1

## 2024-02-13 MED ORDER — NICOTINE 7 MG/24HR TD PT24: 7.0000 mg | MEDICATED_PATCH | Freq: Every day | TRANSDERMAL | Status: DC

## 2024-02-13 MED ORDER — POTASSIUM CHLORIDE CRYS ER 20 MEQ PO TBCR
20.0000 meq | EXTENDED_RELEASE_TABLET | Freq: Once | ORAL | Status: AC
  Administered 2024-02-13: 20 meq via ORAL
  Filled 2024-02-13: qty 1

## 2024-02-13 NOTE — Telephone Encounter (Signed)
-----   Message from Cadence VEAR Fishman sent at 02/12/2024  7:28 PM EST ----- Regarding: hsop follow-up Patient needs a 2 week live heart monitor for syncope and a 3-4 weeks follow-up

## 2024-02-13 NOTE — Plan of Care (Signed)

## 2024-02-13 NOTE — Evaluation (Signed)
 Occupational Therapy Evaluation Patient Details Name: Sheri Barron MRN: [id number] DOB: [birth year] Today's Date: 02/13/2024   History of Present Illness   Pt is a 69 y.o. year old female with medical history of hypertension, hyperlipidemia, type 2 diabetes, chronic hypoxic respiratory failure on 2 L nasal cannula secondary to COPD, tobacco use disorder, history of stroke presented to the ED after being found down.  Patient had a fall and she is unable to recall exact details of the fall.  There is appears to be a recurrent issue for the patient as she had an admission for a fall in 06/2023.     Clinical Impressions Pt was seen for OT evaluation this date. Prior to hospital admission, pt was using a rollator for mobility and mod indep with most ADL. She sponge bathes but does pay someone once a month to come assist with a full bath. Pt endorses difficulty managing medications and has assist from a friend 1x/wk for trash and meals. PT endorses multiple falls. Pt presents with deficits in strength, balance, activity tolerance, and questionable insight into safety/deficits limiting her ability to perform ADL management at baseline level. Pt currently requires CGA to stand from EOB and from [facility] with RW, completes pericare using lateral lean after set up, and able to don socks seated EOB using figure 4 technique after set up. Pt would benefit from skilled OT services to address noted impairments and functional limitations (see below for any additional details) in order to maximize safety and independence while minimizing future risk of falls, injury, and readmission. Anticipate the need for follow up OT services upon acute hospital DC.    If plan is discharge home, recommend the following:   A little help with bathing/dressing/bathroom;Assistance with cooking/housework;Assist for transportation;Help with stairs or ramp for entrance;Direct supervision/assist for medications management     Functional  Status Assessment   Patient has had a recent decline in their functional status and demonstrates the ability to make significant improvements in function in a reasonable and predictable amount of time.     Equipment Recommendations   None recommended by OT     Recommendations for Other Services         Precautions/Restrictions   Precautions Precautions: Fall Recall of Precautions/Restrictions: Intact Restrictions Weight Bearing Restrictions Per Provider Order: No     Mobility Bed Mobility Overal bed mobility: Needs Assistance Bed Mobility: Supine to Sit     Supine to sit: Supervision, HOB elevated, Used rails          Transfers Overall transfer level: Needs assistance Equipment used: Rolling walker (2 wheels) Transfers: Sit to/from Stand Sit to Stand: Contact guard assist                  Balance Overall balance assessment: Needs assistance Sitting-balance support: Feet supported Sitting balance-Leahy Scale: Good     Standing balance support: Bilateral upper extremity supported, During functional activity Standing balance-Leahy Scale: Fair                             ADL either performed or assessed with clinical judgement   ADL Overall ADL's : Needs assistance/impaired                     Lower Body Dressing: Sitting/lateral leans;Set up;Supervision/safety Lower Body Dressing Details (indicate cue type and reason): don socks, anticipate SBA-CGA for LB dressing involving STS transfers Toilet Transfer: Contact guard assist;BSC/3in1;Rolling walker (  2 wheels) Toilet Transfer Details (indicate cue type and reason): step pivot to [facility] 2/2 urgency Toileting- Clothing Manipulation and Hygiene: Set up;Sitting/lateral lean;Supervision/safety       Functional mobility during ADLs: Contact guard assist;Rolling walker (2 wheels)        Pertinent Vitals/Pain Pain Assessment Pain Assessment: Faces Faces Pain Scale: Hurts a little  bit Pain Location: back Pain Descriptors / Indicators: Aching Pain Intervention(s): Monitored during session, Limited activity within patient's tolerance, Repositioned     Extremity/Trunk Assessment Upper Extremity Assessment Upper Extremity Assessment: Generalized weakness   Lower Extremity Assessment Lower Extremity Assessment: Generalized weakness       Communication Communication Communication: No apparent difficulties   Cognition Arousal: Alert Behavior During Therapy: WFL for tasks assessed/performed Cognition: No family/caregiver present to determine baseline             OT - Cognition Comments: Pt notes difficulty managing medications at home                 Following commands: Intact       Cueing  General Comments   Cueing Techniques: Verbal cues              Home Living Family/patient expects to be discharged to:: Private residence Living Arrangements: Alone Available Help at Discharge: Family;Available PRN/intermittently;Friend(s) Type of Home: Mobile home Home Access: Ramped entrance     Home Layout: One level     Bathroom Shower/Tub: Chief Strategy Officer: Standard Bathroom Accessibility: No   Home Equipment: Cane - single point;Tub bench;Rollator (4 wheels)   Additional Comments: friend stops by weekly to help with trash      Prior Functioning/Environment Prior Level of Function : Needs assist             Mobility Comments: using rollator full time in home now. ADLs Comments: IND with ADLs, pays someone to come in and bathe her in the shower once a month. has a friend stop by weekly to cook a meal, take out the trash    OT Problem List: Decreased strength;Decreased activity tolerance;Impaired balance (sitting and/or standing);Decreased knowledge of use of DME or AE   OT Treatment/Interventions: Self-care/ADL training;Therapeutic exercise;Therapeutic activities;DME and/or AE instruction;Energy  conservation;Patient/family education;Balance training      OT Goals(Current goals can be found in the care plan section)   Acute Rehab OT Goals Patient Stated Goal: go home OT Goal Formulation: With patient Time For Goal Achievement: 02/27/24 Potential to Achieve Goals: Good ADL Goals Pt Will Transfer to Toilet: with modified independence;ambulating (LRAD) Pt Will Perform Toileting - Clothing Manipulation and hygiene: sitting/lateral leans;sit to/from stand;with modified independence Additional ADL Goal #1: Pt will verbalize plan to implement at least 2 learned falls prevention strategies.   OT Frequency:  Min 2X/week    Co-evaluation              AM-PAC OT 6 Clicks Daily Activity     Outcome Measure Help from another person eating meals?: None Help from another person taking care of personal grooming?: None Help from another person toileting, which includes using toliet, bedpan, or urinal?: A Little Help from another person bathing (including washing, rinsing, drying)?: A Little Help from another person to put on and taking off regular upper body clothing?: None Help from another person to put on and taking off regular lower body clothing?: A Little 6 Click Score: 21   End of Session Equipment Utilized During Treatment: Oxygen  Activity Tolerance: Patient tolerated treatment  well Patient left: Other (comment) (amb with PT in hall)  OT Visit Diagnosis: Unsteadiness on feet (R26.81);Muscle weakness (generalized) (M62.81)                Time: 9089-9079 OT Time Calculation (min): 10 min Charges:  OT General Charges $OT Visit: 1 Visit OT Evaluation $OT Eval Low Complexity: 1 Low  Warren SAUNDERS., MPH, MS, OTR/L ascom [phone] 02/13/24, 2:01 PM

## 2024-02-13 NOTE — TOC Initial Note (Signed)
 [facility]) - Initial/Assessment Note    Patient Details  Name: Sheri Barron MRN: [id number] Date of Birth: 11-12-1955  [facility] CM/SW Contact:    Grayce JAYSON Perfect, RN Phone Number: 02/13/2024, 3:24 PM  Clinical Narrative:     RNCM reviewed chart.  PT/OT recommends homecare PT/OT services.  RNCM spoke with daughter, Mackey, she is in agreement with this, no preference for agencies.  Referrals sent out to local homecare agencies.  RNCM notified Shanta that plan is for patient to discharge tomorrow and does not qualify for [facility] so will need transport and will need home oxygen tank brought to hospital for ride home.  RNCM will follow up tomorrow for discharge plan.  Mackey reports she is not sure if anyone can even get to her home due to the roads/snow.  RN notified her that [facility] [employer] may be able to transport home tomorrow, RN will follow up tomorrow.              Expected Discharge Plan: Home w Home Health Services Barriers to Discharge: No Barriers Identified   Patient Goals and CMS Choice            Expected Discharge Plan and Services   Discharge Planning Services: CM Consult   Living arrangements for the past 2 months: Single Family Home                           HH Arranged: PT, OT HH Agency:  (referrals sent out to local [facility] agencies) Date [facility] Agency Contacted: 02/13/24      Prior Living Arrangements/Services Living arrangements for the past 2 months: Single Family Home Lives with:: Self Patient language and need for interpreter reviewed:: Yes Do you feel safe going back to the place where you live?: Yes      Need for Family Participation in Patient Care: Yes (Comment) (will bring oxygen tank to room for transport) Care giver support system in place?: Yes (comment) (daughter, Mackey)   Criminal Activity/Legal Involvement Pertinent to Current Situation/Hospitalization: No - Comment as needed  Activities of Daily Living   ADL  Screening (condition at time of admission) Independently performs ADLs?: No Does the patient have a NEW difficulty with bathing/dressing/toileting/self-feeding that is expected to last >3 days?: No Does the patient have a NEW difficulty with getting in/out of bed, walking, or climbing stairs that is expected to last >3 days?: No Does the patient have a NEW difficulty with communication that is expected to last >3 days?: No Is the patient deaf or have difficulty hearing?: No Does the patient have difficulty seeing, even when wearing glasses/contacts?: Yes Does the patient have difficulty concentrating, remembering, or making decisions?: No  Permission Sought/Granted Permission sought to share information with : Case Manager                Emotional Assessment           Psych Involvement: No (comment)  Admission diagnosis:  Hypokalemia [E87.6] Acute encephalopathy [G93.40] AKI (acute kidney injury) [N17.9] Altered mental status, unspecified altered mental status type [R41.82] Patient Active Problem List   Diagnosis Date Noted   Acute encephalopathy 02/12/2024   Altered mental status 02/12/2024   Hypomagnesemia 02/12/2024   Syncope and collapse 02/11/2024   AKI (acute kidney injury) 06/20/2023   Acute metabolic encephalopathy 06/20/2023   Lactic acidosis 06/20/2023   Fall at home, initial encounter 06/20/2023   Cellulitis of sacral  region 06/20/2023   Infection due to parainfluenza virus 1 11/30/2022   COPD (chronic obstructive pulmonary disease) (HCC) 11/28/2022   CAP (community acquired pneumonia) 11/28/2022   Severe sepsis (HCC) 11/28/2022   HTN (hypertension) 11/28/2022   Diabetes mellitus without complication (HCC) 11/28/2022   H/O: stroke 11/28/2022   Depression with anxiety 11/28/2022   Tobacco use disorder 11/28/2022   Acute on chronic respiratory failure with hypoxia and hypercapnia (HCC) 11/28/2022   Irritable bowel syndrome without diarrhea 05/17/2016    Hypokalemia 11/14/2015   Essential tremor 11/02/2013   PCP:  Adina Buel HERO, MD Pharmacy:   [facility] - [place], [place] - [address] [place] [address] Phone: [phone] Fax: [phone]  [facility] - [place] - [address] [place] [address] Phone: [phone] Fax: [phone]  [facility] [address] [place] [address] Phone: [phone] Fax: [phone]     Social Drivers of Health (SDOH) Social History: SDOH Screenings   Food Insecurity: Food Insecurity Present (02/12/2024)  Housing: Low Risk (02/12/2024)  Transportation Needs: No Transportation Needs (02/12/2024)  Utilities: At Risk (02/12/2024)  Social Connections: Unknown (02/13/2024)  Tobacco Use: High Risk (02/11/2024)   SDOH Interventions:     Readmission Risk Interventions     No data to display

## 2024-02-14 ENCOUNTER — Other Ambulatory Visit: Payer: Self-pay

## 2024-02-14 DIAGNOSIS — R55 Syncope and collapse: Secondary | ICD-10-CM | POA: Diagnosis not present

## 2024-02-14 DIAGNOSIS — N179 Acute kidney failure, unspecified: Principal | ICD-10-CM

## 2024-02-14 DIAGNOSIS — G250 Essential tremor: Secondary | ICD-10-CM

## 2024-02-14 DIAGNOSIS — E119 Type 2 diabetes mellitus without complications: Secondary | ICD-10-CM | POA: Diagnosis not present

## 2024-02-14 LAB — BASIC METABOLIC PANEL WITH GFR
Anion gap: 6 (ref 5–15)
BUN: 11 mg/dL (ref 8–23)
CO2: 36 mmol/L — ABNORMAL HIGH (ref 22–32)
Calcium: 9.2 mg/dL (ref 8.9–10.3)
Chloride: 100 mmol/L (ref 98–111)
Creatinine, Ser: 0.72 mg/dL (ref 0.44–1.00)
GFR, Estimated: >60 mL/min
Glucose, Bld: 85 mg/dL (ref 70–99)
Potassium: 4.1 mmol/L (ref 3.5–5.1)
Sodium: 142 mmol/L (ref 135–145)

## 2024-02-14 LAB — PHOSPHORUS: Phosphorus: 3.1 mg/dL (ref 2.5–4.6)

## 2024-02-14 LAB — MAGNESIUM: Magnesium: 1.5 mg/dL — ABNORMAL LOW (ref 1.7–2.4)

## 2024-02-14 LAB — GLUCOSE, CAPILLARY: Glucose-Capillary: 102 mg/dL — ABNORMAL HIGH (ref 70–99)

## 2024-02-14 MED ORDER — MAGNESIUM OXIDE -MG SUPPLEMENT 400 (240 MG) MG PO TABS
400.0000 mg | ORAL_TABLET | Freq: Every day | ORAL | Status: DC
  Administered 2024-02-14: 400 mg via ORAL
  Filled 2024-02-14: qty 1

## 2024-02-14 MED ORDER — ENOXAPARIN SODIUM 40 MG/0.4ML IJ SOSY: 40.0000 mg | PREFILLED_SYRINGE | INTRAMUSCULAR | Status: DC

## 2024-02-14 MED ORDER — NICOTINE 7 MG/24HR TD PT24: 7.0000 mg | MEDICATED_PATCH | Freq: Every day | TRANSDERMAL | 0 refills | Status: AC

## 2024-02-14 MED ORDER — MAGNESIUM OXIDE -MG SUPPLEMENT 400 (240 MG) MG PO TABS: 400.0000 mg | ORAL_TABLET | Freq: Every day | ORAL | 1 refills | Status: AC

## 2024-02-14 MED ORDER — MAGNESIUM SULFATE 4 GM/100ML IV SOLN
4.0000 g | Freq: Once | INTRAVENOUS | Status: AC
  Administered 2024-02-14: 4 g via INTRAVENOUS
  Filled 2024-02-14: qty 100

## 2024-02-14 MED ORDER — ENSURE PLUS HIGH PROTEIN PO LIQD: 237.0000 mL | Freq: Two times a day (BID) | ORAL | 0 refills | Status: AC

## 2024-02-14 NOTE — Progress Notes (Signed)
 Physical Therapy Treatment Patient Details Name: Sheri Barron MRN: [id number] DOB: 12-17-1955 Today's Date: 02/14/2024   History of Present Illness Pt is a 69 y.o. year old female with medical history of hypertension, hyperlipidemia, type 2 diabetes, chronic hypoxic respiratory failure on 2 L nasal cannula secondary to COPD, tobacco use disorder, history of stroke presented to the ED after being found down.  Patient had a fall and she is unable to recall exact details of the fall.  There is appears to be a recurrent issue for the patient as she had an admission for a fall in 06/2023.    PT Comments  Patient alert, agreeable to PT. Overall the patient demonstrated improvement in activity tolerance and mobility. Sit <> stand with RW and CGA. She was able to ambulate ~57ft with RW and CGA. Decreased gait velocity noted. spO2 >88% on 3L for ambulation. Pt also able to use BSC and perform pericare with supervision. The patient would benefit from further skilled PT intervention to continue to progress towards goals.     If plan is discharge home, recommend the following: A little help with bathing/dressing/bathroom;Assistance with cooking/housework;Assist for transportation;Help with stairs or ramp for entrance;Direct supervision/assist for medications management   Can travel by private vehicle        Equipment Recommendations  None recommended by PT    Recommendations for Other Services       Precautions / Restrictions Precautions Precautions: Fall Recall of Precautions/Restrictions: Intact Restrictions Weight Bearing Restrictions Per Provider Order: No     Mobility  Bed Mobility Overal bed mobility: Needs Assistance Bed Mobility: Supine to Sit     Supine to sit: Supervision, HOB elevated, Used rails Sit to supine: Used rails, Supervision, HOB elevated        Transfers Overall transfer level: Needs assistance Equipment used: Rolling walker (2 wheels) Transfers: Sit to/from  Stand Sit to Stand: Contact guard assist                Ambulation/Gait Ambulation/Gait assistance: Contact guard assist Gait Distance (Feet): 60 Feet Assistive device: Rolling walker (2 wheels)   Gait velocity: decreased         Stairs             Wheelchair Mobility     Tilt Bed    Modified Rankin (Stroke Patients Only)       Balance Overall balance assessment: Needs assistance Sitting-balance support: Feet supported Sitting balance-Leahy Scale: Good     Standing balance support: Bilateral upper extremity supported, During functional activity Standing balance-Leahy Scale: Fair                              Communication    Cognition Arousal: Alert Behavior During Therapy: WFL for tasks assessed/performed   PT - Cognitive impairments: No apparent impairments                         Following commands: Intact      Cueing    Exercises      General Comments        Pertinent Vitals/Pain Pain Assessment Pain Assessment: No/denies pain    Home Living                          Prior Function            PT Goals (current goals can now  be found in the care plan section) Progress towards PT goals: Progressing toward goals    Frequency    Min 2X/week      PT Plan      Co-evaluation              AM-PAC PT 6 Clicks Mobility   Outcome Measure  Help needed turning from your back to your side while in a flat bed without using bedrails?: None Help needed moving from lying on your back to sitting on the side of a flat bed without using bedrails?: None Help needed moving to and from a bed to a chair (including a wheelchair)?: None Help needed standing up from a chair using your arms (e.g., wheelchair or bedside chair)?: A Little Help needed to walk in hospital room?: A Little Help needed climbing 3-5 steps with a railing? : A Lot 6 Click Score: 20    End of Session   Activity Tolerance:  Patient tolerated treatment well Patient left: in bed;with call bell/phone within reach;with bed alarm set (pt declined OOB to chair, agreeable to OOB for lunch) Nurse Communication: Mobility status PT Visit Diagnosis: Other abnormalities of gait and mobility (R26.89);Difficulty in walking, not elsewhere classified (R26.2);Muscle weakness (generalized) (M62.81)     Time: 8981-8964 PT Time Calculation (min) (ACUTE ONLY): 17 min  Charges:    $Therapeutic Activity: 8-22 mins PT General Charges $$ ACUTE PT VISIT: 1 Visit                     Doyal Shams PT, DPT 12:51 PM,02/14/24

## 2024-03-12 ENCOUNTER — Ambulatory Visit
